# Patient Record
Sex: Female | Born: 1975 | Race: Black or African American | Hispanic: No | Smoking: Never smoker
Health system: Southern US, Community
[De-identification: ages and names within clinical notes are randomized; demographics above are authoritative.]

## PROBLEM LIST (undated history)

## (undated) DIAGNOSIS — E119 Type 2 diabetes mellitus without complications: Secondary | ICD-10-CM

## (undated) DIAGNOSIS — I10 Essential (primary) hypertension: Secondary | ICD-10-CM

## (undated) DIAGNOSIS — F419 Anxiety disorder, unspecified: Secondary | ICD-10-CM

## (undated) DIAGNOSIS — K8020 Calculus of gallbladder without cholecystitis without obstruction: Secondary | ICD-10-CM

## (undated) DIAGNOSIS — Z87442 Personal history of urinary calculi: Secondary | ICD-10-CM

## (undated) DIAGNOSIS — R51 Headache: Secondary | ICD-10-CM

## (undated) DIAGNOSIS — R519 Headache, unspecified: Secondary | ICD-10-CM

## (undated) DIAGNOSIS — M1990 Unspecified osteoarthritis, unspecified site: Secondary | ICD-10-CM

## (undated) DIAGNOSIS — R0600 Dyspnea, unspecified: Secondary | ICD-10-CM

## (undated) HISTORY — PX: TUBAL LIGATION: SHX77

## (undated) HISTORY — PX: CHOLECYSTECTOMY: SHX55

## (undated) HISTORY — PX: OTHER SURGICAL HISTORY: SHX169

---

## 1998-07-26 ENCOUNTER — Other Ambulatory Visit: Admission: RE | Admit: 1998-07-26 | Discharge: 1998-07-26 | Payer: Self-pay | Admitting: Obstetrics and Gynecology

## 1999-06-17 ENCOUNTER — Emergency Department (HOSPITAL_COMMUNITY): Admission: EM | Admit: 1999-06-17 | Discharge: 1999-06-17 | Payer: Self-pay | Admitting: Emergency Medicine

## 1999-06-17 ENCOUNTER — Encounter: Payer: Self-pay | Admitting: Emergency Medicine

## 1999-07-19 ENCOUNTER — Other Ambulatory Visit: Admission: RE | Admit: 1999-07-19 | Discharge: 1999-07-19 | Payer: Self-pay | Admitting: Obstetrics and Gynecology

## 1999-09-06 ENCOUNTER — Emergency Department (HOSPITAL_COMMUNITY): Admission: EM | Admit: 1999-09-06 | Discharge: 1999-09-06 | Payer: Self-pay | Admitting: Emergency Medicine

## 1999-09-07 ENCOUNTER — Emergency Department (HOSPITAL_COMMUNITY): Admission: EM | Admit: 1999-09-07 | Discharge: 1999-09-07 | Payer: Self-pay | Admitting: Emergency Medicine

## 1999-09-09 ENCOUNTER — Emergency Department (HOSPITAL_COMMUNITY): Admission: EM | Admit: 1999-09-09 | Discharge: 1999-09-09 | Payer: Self-pay | Admitting: Emergency Medicine

## 1999-09-13 ENCOUNTER — Emergency Department (HOSPITAL_COMMUNITY): Admission: EM | Admit: 1999-09-13 | Discharge: 1999-09-13 | Payer: Self-pay | Admitting: Emergency Medicine

## 1999-09-17 ENCOUNTER — Emergency Department (HOSPITAL_COMMUNITY): Admission: EM | Admit: 1999-09-17 | Discharge: 1999-09-17 | Payer: Self-pay | Admitting: *Deleted

## 2003-04-10 ENCOUNTER — Other Ambulatory Visit: Admission: RE | Admit: 2003-04-10 | Discharge: 2003-04-10 | Payer: Self-pay | Admitting: Obstetrics and Gynecology

## 2004-05-23 ENCOUNTER — Other Ambulatory Visit: Admission: RE | Admit: 2004-05-23 | Discharge: 2004-05-23 | Payer: Self-pay | Admitting: Obstetrics and Gynecology

## 2005-02-17 ENCOUNTER — Emergency Department (HOSPITAL_COMMUNITY): Admission: EM | Admit: 2005-02-17 | Discharge: 2005-02-17 | Payer: Self-pay | Admitting: Emergency Medicine

## 2005-03-07 ENCOUNTER — Emergency Department (HOSPITAL_COMMUNITY): Admission: EM | Admit: 2005-03-07 | Discharge: 2005-03-07 | Payer: Self-pay | Admitting: Emergency Medicine

## 2009-10-14 ENCOUNTER — Emergency Department (HOSPITAL_COMMUNITY): Admission: EM | Admit: 2009-10-14 | Discharge: 2009-10-15 | Payer: Self-pay | Admitting: Emergency Medicine

## 2010-05-16 LAB — GLUCOSE, CAPILLARY: Glucose-Capillary: 109 mg/dL — ABNORMAL HIGH (ref 70–99)
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
## 2010-12-17 ENCOUNTER — Emergency Department (HOSPITAL_COMMUNITY)
Admission: EM | Admit: 2010-12-17 | Discharge: 2010-12-17 | Disposition: A | Discharge status: Home / Self Care | Payer: Self-pay | Attending: Emergency Medicine | Admitting: Emergency Medicine

## 2010-12-17 DIAGNOSIS — B9689 Other specified bacterial agents as the cause of diseases classified elsewhere: Secondary | ICD-10-CM | POA: Insufficient documentation

## 2010-12-17 DIAGNOSIS — K6289 Other specified diseases of anus and rectum: Secondary | ICD-10-CM | POA: Insufficient documentation

## 2010-12-17 DIAGNOSIS — A499 Bacterial infection, unspecified: Secondary | ICD-10-CM | POA: Insufficient documentation

## 2010-12-17 DIAGNOSIS — N760 Acute vaginitis: Secondary | ICD-10-CM | POA: Insufficient documentation

## 2010-12-17 DIAGNOSIS — R109 Unspecified abdominal pain: Secondary | ICD-10-CM | POA: Insufficient documentation

## 2010-12-17 LAB — URINALYSIS, ROUTINE W REFLEX MICROSCOPIC
Ketones, ur: NEGATIVE mg/dL
Leukocytes, UA: NEGATIVE
Nitrite: NEGATIVE
Protein, ur: NEGATIVE mg/dL
Urobilinogen, UA: 0.2 mg/dL (ref 0.0–1.0)

## 2010-12-17 LAB — POCT PREGNANCY, URINE: Preg Test, Ur: NEGATIVE

## 2011-04-08 ENCOUNTER — Encounter (HOSPITAL_COMMUNITY): Payer: Self-pay | Admitting: Emergency Medicine

## 2011-04-08 ENCOUNTER — Emergency Department (HOSPITAL_COMMUNITY)
Admission: EM | Admit: 2011-04-08 | Discharge: 2011-04-08 | Disposition: A | Discharge status: Home / Self Care | Payer: Self-pay | Attending: Emergency Medicine | Admitting: Emergency Medicine

## 2011-04-08 DIAGNOSIS — R109 Unspecified abdominal pain: Secondary | ICD-10-CM | POA: Insufficient documentation

## 2011-04-08 HISTORY — DX: Essential (primary) hypertension: I10

## 2011-04-08 LAB — DIFFERENTIAL
Basophils Absolute: 0 10*3/uL (ref 0.0–0.1)
Basophils Relative: 1 % (ref 0–1)
Eosinophils Absolute: 0.1 10*3/uL (ref 0.0–0.7)
Eosinophils Relative: 1 % (ref 0–5)
Lymphocytes Relative: 38 % (ref 12–46)
Lymphs Abs: 2.2 10*3/uL (ref 0.7–4.0)
Monocytes Absolute: 0.5 10*3/uL (ref 0.1–1.0)
Monocytes Relative: 8 % (ref 3–12)
Neutro Abs: 3.1 10*3/uL (ref 1.7–7.7)
Neutrophils Relative %: 53 % (ref 43–77)

## 2011-04-08 LAB — POCT PREGNANCY, URINE: Preg Test, Ur: NEGATIVE

## 2011-04-08 LAB — COMPREHENSIVE METABOLIC PANEL
ALT: 13 U/L (ref 0–35)
AST: 13 U/L (ref 0–37)
Albumin: 3.6 g/dL (ref 3.5–5.2)
Alkaline Phosphatase: 68 U/L (ref 39–117)
BUN: 14 mg/dL (ref 6–23)
CO2: 26 mEq/L (ref 19–32)
Calcium: 9.4 mg/dL (ref 8.4–10.5)
Chloride: 103 mEq/L (ref 96–112)
Creatinine, Ser: 0.92 mg/dL (ref 0.50–1.10)
GFR calc Af Amer: >90 mL/min (ref >90)
GFR calc non Af Amer: 78 mL/min — ABNORMAL LOW (ref >90)
Glucose, Bld: 83 mg/dL (ref 70–99)
Potassium: 3.6 mEq/L (ref 3.5–5.1)
Sodium: 138 mEq/L (ref 135–145)
Total Bilirubin: 0.1 mg/dL — ABNORMAL LOW (ref 0.3–1.2)
Total Protein: 6.8 g/dL (ref 6.0–8.3)

## 2011-04-08 LAB — CBC
HCT: 35 % — ABNORMAL LOW (ref 36.0–46.0)
Hemoglobin: 11.2 g/dL — ABNORMAL LOW (ref 12.0–15.0)
MCH: 23.5 pg — ABNORMAL LOW (ref 26.0–34.0)
MCHC: 32 g/dL (ref 30.0–36.0)
MCV: 73.4 fL — ABNORMAL LOW (ref 78.0–100.0)
Platelets: 297 10*3/uL (ref 150–400)
RBC: 4.77 MIL/uL (ref 3.87–5.11)
RDW: 15.5 % (ref 11.5–15.5)
WBC: 5.9 10*3/uL (ref 4.0–10.5)

## 2011-04-08 LAB — URINALYSIS, ROUTINE W REFLEX MICROSCOPIC
Glucose, UA: NEGATIVE mg/dL
Leukocytes, UA: NEGATIVE
Nitrite: NEGATIVE
Protein, ur: NEGATIVE mg/dL
pH: 7.5 (ref 5.0–8.0)

## 2011-04-08 LAB — LIPASE, BLOOD: Lipase: 35 U/L (ref 11–59)

## 2011-04-08 NOTE — ED Notes (Signed)
Pt st's her abdominal pain started 2 days ago, pt took her Mother's gabapentin but that didn't help.  No n/v/d.  St's the last time she had this pain she was dx with bacterial vaginosis, was given antibiotics and that stopped the pain.  Last BM was yesterday.  St's "the pain feels like someone is pulling at my belly button from inside, it feels like pressure."

## 2011-04-08 NOTE — ED Notes (Signed)
Patient is resting comfortably. 

## 2011-04-08 NOTE — ED Notes (Signed)
Patient complains of abdominal pain beginning yesterday, got worse today. Pain is described as pulling pressure around umbilicus, also complains of bloating. No nausea or vomiting associated with pain. Last bowel movement yesterday, patient states it was normal.

## 2011-04-08 NOTE — ED Notes (Signed)
MD at bedside. 

## 2011-04-08 NOTE — ED Provider Notes (Signed)
History     CSN: [id number]  Arrival date & time 04/08/11  1549   First MD Initiated Contact with Patient 04/08/11 2013      Chief Complaint  Patient presents with  . Abdominal Pain    HPI: Patient is a 36 y.o. female presenting with abdominal pain. The history is provided by the patient.  Abdominal Pain The primary symptoms of the illness include abdominal pain. The primary symptoms of the illness do not include fever, shortness of breath, nausea, vomiting, diarrhea, dysuria or vaginal discharge. The current episode started 13 to 24 hours ago. The onset of the illness was gradual. The problem has been gradually worsening.  The patient states that she believes she is currently not pregnant. The patient has not had a change in bowel habit. Symptoms associated with the illness do not include chills, anorexia, constipation, urgency, frequency or back pain.  Pt noted to be sleeping upon my arrival to room.  Reports onset of periumbilical abd pain yesterday that has been persistent. States she has also had increased bloating but states this is not new for her.  Pt denies N/V/D, fever, anorexia, CP, SOB, back pain, UTI sx's or constipation. States had normal BM yesterday. Denies unusual vag d/c, LMP 03/29/2011 was normal. States had to leave work today due to pain which is currently 5/10.   Past Medical History  Diagnosis Date  . Hypertension     Past Surgical History  Procedure Date  . Tubal ligation     Family History  Problem Relation Age of Onset  . Hypertension Mother   . Diabetes Mother   . Heart failure Mother   . Hypertension Father     History  Substance Use Topics  . Smoking status: Never Smoker   . Smokeless tobacco: Never Used  . Alcohol Use: Yes     occausional    OB History    Grav Para Term Preterm Abortions TAB SAB Ect Mult Living                  Review of Systems  Constitutional: Negative.  Negative for fever and chills.  HENT: Negative.   Eyes:  Negative.   Respiratory: Negative.  Negative for shortness of breath.   Cardiovascular: Negative.   Gastrointestinal: Positive for abdominal pain. Negative for nausea, vomiting, diarrhea, constipation and anorexia.  Genitourinary: Negative.  Negative for dysuria, urgency, frequency and vaginal discharge.  Musculoskeletal: Negative.  Negative for back pain.  Skin: Negative.   Neurological: Negative.   Hematological: Negative.   Psychiatric/Behavioral: Negative.     Allergies  Review of patient's allergies indicates no known allergies.  Home Medications   Current Outpatient Rx  Name Route Sig Dispense Refill  . GABAPENTIN PO Oral Take 1 capsule by mouth daily as needed. For pain.    . ADULT MULTIVITAMIN W/MINERALS CH Oral Take 1 tablet by mouth daily.    Marland Kitchen NAPROXEN SODIUM 220 MG PO TABS Oral Take 440 mg by mouth 2 (two) times daily as needed. For pain.      BP 158/96  Pulse 78  Temp(Src) 98.6 F (37 C) (Oral)  Resp 16  Ht 5\' 2"  (1.575 m)  Wt 237 lb (107.502 kg)  BMI 43.35 kg/m2  SpO2 100%  LMP 03/29/2011  Physical Exam  Constitutional: She is oriented to person, place, and time. She appears well-developed and well-nourished.  HENT:  Head: Normocephalic and atraumatic.  Eyes: Conjunctivae are normal.  Neck: Neck supple.  Cardiovascular: Normal  rate and regular rhythm.   Pulmonary/Chest: Effort normal and breath sounds normal.  Abdominal: Soft. Bowel sounds are normal.    Musculoskeletal: Normal range of motion.  Neurological: She is alert and oriented to person, place, and time.  Skin: Skin is warm and dry. No erythema.  Psychiatric: She has a normal mood and affect.    ED Course  Procedures  Findings and clinical impression discussed w/ pt. Will plan for d/c home w/ referals to assist pt in getting established w/ PCP. Pt agreeable w/ plan. I have discussed py w/ Dr Denton Lank who has also seen pt and is in agreement w/ plan.  Labs Reviewed  CBC - Abnormal; Notable  for the following:    Hemoglobin 11.2 (*)    HCT 35.0 (*)    MCV 73.4 (*)    MCH 23.5 (*)    All other components within normal limits  COMPREHENSIVE METABOLIC PANEL - Abnormal; Notable for the following:    Total Bilirubin 0.1 (*)    GFR calc non Af Amer 78 (*)    All other components within normal limits  URINALYSIS, ROUTINE W REFLEX MICROSCOPIC  POCT PREGNANCY, URINE  DIFFERENTIAL  LIPASE, BLOOD   No results found.   No diagnosis found.    MDM  Pt w/ periumbilical pain x 1 day. No associated sx's. Labs and u/a w/o acute findings. PE unremarkable. VS normal. Pt noted sleeping upon most re-assessments Acute abd process considered but unlikely given above findings.        Leanne Chang, NP 04/10/11 2032

## 2011-04-10 NOTE — ED Provider Notes (Signed)
History     CSN: [id number]  Arrival date & time 04/08/11  1549   First MD Initiated Contact with Patient 04/08/11 2013      Chief Complaint  Patient presents with  . Abdominal Pain    (Consider location/radiation/quality/duration/timing/severity/associated sxs/prior treatment) HPI  Past Medical History  Diagnosis Date  . Hypertension     Past Surgical History  Procedure Date  . Tubal ligation     Family History  Problem Relation Age of Onset  . Hypertension Mother   . Diabetes Mother   . Heart failure Mother   . Hypertension Father     History  Substance Use Topics  . Smoking status: Never Smoker   . Smokeless tobacco: Never Used  . Alcohol Use: Yes     occausional    OB History    Grav Para Term Preterm Abortions TAB SAB Ect Mult Living                  Review of Systems  Allergies  Review of patient's allergies indicates no known allergies.  Home Medications   Current Outpatient Rx  Name Route Sig Dispense Refill  . GABAPENTIN PO Oral Take 1 capsule by mouth daily as needed. For pain.    . ADULT MULTIVITAMIN W/MINERALS CH Oral Take 1 tablet by mouth daily.    Marland Kitchen NAPROXEN SODIUM 220 MG PO TABS Oral Take 440 mg by mouth 2 (two) times daily as needed. For pain.      BP 141/85  Pulse 94  Temp(Src) 98.6 F (37 C) (Oral)  Resp 16  Ht 5\' 2"  (1.575 m)  Wt 237 lb (107.502 kg)  BMI 43.35 kg/m2  SpO2 100%  LMP 03/29/2011  Physical Exam  ED Course  Procedures (including critical care time)  Labs Reviewed  CBC - Abnormal; Notable for the following:    Hemoglobin 11.2 (*)    HCT 35.0 (*)    MCV 73.4 (*)    MCH 23.5 (*)    All other components within normal limits  COMPREHENSIVE METABOLIC PANEL - Abnormal; Notable for the following:    Total Bilirubin 0.1 (*)    GFR calc non Af Amer 78 (*)    All other components within normal limits  URINALYSIS, ROUTINE W REFLEX MICROSCOPIC  POCT PREGNANCY, URINE  DIFFERENTIAL  LIPASE, BLOOD  LAB REPORT  - SCANNED    1. Abdominal pain       MDM        Medical screening examination/treatment/procedure(s) were conducted as a shared visit with non-physician practitioner(s) and myself.  I personally evaluated the patient during the encounter abd soft nt. Pain resolved.   Suzi Roots, MD 04/10/11 [phone]

## 2011-04-11 NOTE — ED Provider Notes (Signed)
Medical screening examination/treatment/procedure(s) were conducted as a shared visit with non-physician practitioner(s) and myself.  I personally evaluated the patient during the encounter abd soft nt. No nvd.   Suzi Roots, MD 04/11/11 1325

## 2012-03-29 ENCOUNTER — Telehealth: Payer: Self-pay | Admitting: Obstetrics and Gynecology

## 2012-03-29 NOTE — Telephone Encounter (Signed)
Attempted to call pt, unable to leave vm.

## 2012-03-30 ENCOUNTER — Telehealth: Payer: Self-pay

## 2012-03-30 NOTE — Telephone Encounter (Signed)
Tc to pt requesting referral for pcp. Advised pt that she could try to contact the offices of Dr. Dorothyann Peng, Dr. Renford Dills, and Dr. Merri Brunette and gave contact information. Pt agreeable.

## 2015-05-05 ENCOUNTER — Emergency Department (HOSPITAL_COMMUNITY)
Admission: EM | Admit: 2015-05-05 | Discharge: 2015-05-05 | Disposition: A | Discharge status: Home / Self Care | Payer: Self-pay | Attending: Emergency Medicine | Admitting: Emergency Medicine

## 2015-05-05 ENCOUNTER — Emergency Department (HOSPITAL_COMMUNITY): Payer: Self-pay

## 2015-05-05 ENCOUNTER — Encounter (HOSPITAL_COMMUNITY): Payer: Self-pay | Admitting: Emergency Medicine

## 2015-05-05 DIAGNOSIS — M546 Pain in thoracic spine: Secondary | ICD-10-CM | POA: Insufficient documentation

## 2015-05-05 DIAGNOSIS — K8050 Calculus of bile duct without cholangitis or cholecystitis without obstruction: Secondary | ICD-10-CM

## 2015-05-05 DIAGNOSIS — K8020 Calculus of gallbladder without cholecystitis without obstruction: Secondary | ICD-10-CM | POA: Insufficient documentation

## 2015-05-05 DIAGNOSIS — I10 Essential (primary) hypertension: Secondary | ICD-10-CM | POA: Insufficient documentation

## 2015-05-05 DIAGNOSIS — Z79899 Other long term (current) drug therapy: Secondary | ICD-10-CM | POA: Insufficient documentation

## 2015-05-05 DIAGNOSIS — R079 Chest pain, unspecified: Secondary | ICD-10-CM | POA: Insufficient documentation

## 2015-05-05 LAB — CBC
HCT: 34.5 % — ABNORMAL LOW (ref 36.0–46.0)
HEMOGLOBIN: 11.2 g/dL — AB (ref 12.0–15.0)
MCH: 24.6 pg — AB (ref 26.0–34.0)
MCHC: 32.5 g/dL (ref 30.0–36.0)
MCV: 75.7 fL — ABNORMAL LOW (ref 78.0–100.0)
Platelets: 277 10*3/uL (ref 150–400)
RBC: 4.56 MIL/uL (ref 3.87–5.11)
RDW: 15.1 % (ref 11.5–15.5)
WBC: 6.3 10*3/uL (ref 4.0–10.5)

## 2015-05-05 LAB — COMPREHENSIVE METABOLIC PANEL
ALBUMIN: 3.6 g/dL (ref 3.5–5.0)
ALK PHOS: 64 U/L (ref 38–126)
ALT: 12 U/L — AB (ref 14–54)
AST: 18 U/L (ref 15–41)
Anion gap: 12 (ref 5–15)
BUN: 13 mg/dL (ref 6–20)
CALCIUM: 9.5 mg/dL (ref 8.9–10.3)
CHLORIDE: 104 mmol/L (ref 101–111)
CO2: 25 mmol/L (ref 22–32)
CREATININE: 1 mg/dL (ref 0.44–1.00)
GFR calc Af Amer: >60 mL/min (ref >60)
GFR calc non Af Amer: >60 mL/min (ref >60)
GLUCOSE: 167 mg/dL — AB (ref 65–99)
Potassium: 3.8 mmol/L (ref 3.5–5.1)
SODIUM: 141 mmol/L (ref 135–145)
Total Bilirubin: 0.4 mg/dL (ref 0.3–1.2)
Total Protein: 6.3 g/dL — ABNORMAL LOW (ref 6.5–8.1)

## 2015-05-05 LAB — TROPONIN I: Troponin I: <0.03 ng/mL (ref <0.031)

## 2015-05-05 LAB — LIPASE, BLOOD: Lipase: 38 U/L (ref 11–51)

## 2015-05-05 MED ORDER — HYDROCODONE-ACETAMINOPHEN 5-325 MG PO TABS: 1.0000 | ORAL_TABLET | Freq: Four times a day (QID) | ORAL | Status: DC | PRN

## 2015-05-05 NOTE — ED Notes (Signed)
Patient transported to Ultrasound 

## 2015-05-05 NOTE — ED Provider Notes (Addendum)
`CSN: [id number]     Arrival date & time 05/05/15  0006 History  By signing my name below, I, Melinda Ingram, attest that this documentation has been prepared under the direction and in the presence of [name], MD. Electronically Signed: [name], ED Scribe. 05/05/2015. 2:40 AM.    Chief Complaint  Patient presents with  . Chest Pain  . Abdominal Pain  . Back Pain   The history is provided by the patient. No language interpreter was used.    HPI Comments: Melinda Ingram is a 40 y.o. female with h/o HTN, GERD who presents to the Emergency Department complaining of moderate, improving, burning epigastric abdominal pain and substernal CP onset 6 hours ago while at rest. No associated w exertion or activity level. Symptoms not pleuritic. Pt states associated mid to upper back pain. She notes that her pain has been greatly improving since onset. Pt states she did not eat before onset of pain. She states she took acid reflux medication and drank water with baking soda with mild to moderate of symptoms. She reports that she has had 2 other prior episodes of similar pain and has been given a possible dx of GERD. No h/o cholelithiasis, cholecystectomy. No fam hx premature cad, although states mother and gm with unspecified heart problems.   She denies emesis, diaphoresis, SOB, cough, congestion, rhinorrhea, sore throat, HA, visual disturbance.    Past Medical History  Diagnosis Date  . Hypertension   . GERD (gastroesophageal reflux disease)    Past Surgical History  Procedure Laterality Date  . Tubal ligation     Family History  Problem Relation Age of Onset  . Hypertension Mother   . Diabetes Mother   . Heart failure Mother   . Hypertension Father    Social History  Substance Use Topics  . Smoking status: Never Smoker   . Smokeless tobacco: Never Used  . Alcohol Use: Yes     Comment: occausional   OB History    No data available     Review of Systems  Constitutional: Negative for  fever, chills and diaphoresis.  HENT: Negative for congestion, rhinorrhea and sore throat.   Eyes: Negative for redness and visual disturbance.  Respiratory: Negative for cough and shortness of breath.   Cardiovascular: Positive for chest pain. Negative for leg swelling.  Gastrointestinal: Positive for nausea and abdominal pain. Negative for vomiting and diarrhea.  Endocrine: Negative for polyuria.  Genitourinary: Negative for dysuria, vaginal bleeding and vaginal discharge.  Musculoskeletal: Positive for back pain. Negative for neck pain.  Skin: Negative for rash.  Neurological: Negative for headaches.  Hematological: Does not bruise/bleed easily.  Psychiatric/Behavioral: Negative for confusion.   Allergies  Review of patient's allergies indicates no known allergies.  Home Medications   Prior to Admission medications   Medication Sig Start Date End Date Taking? Authorizing Provider  famotidine (PEPCID) 20 MG tablet Take 20 mg by mouth 2 (two) times daily.   Yes Historical Provider, MD  GABAPENTIN PO Take 1 capsule by mouth daily as needed. For pain.    Historical Provider, MD  Multiple Vitamin (MULITIVITAMIN WITH MINERALS) TABS Take 1 tablet by mouth daily.    Historical Provider, MD  naproxen sodium (ANAPROX) 220 MG tablet Take 440 mg by mouth 2 (two) times daily as needed. For pain.    Historical Provider, MD   BP 159/90 mmHg  Pulse 64  Temp(Src) 97.4 F (36.3 C) (Oral)  Resp 18  SpO2 98%  LMP 04/28/2015  Physical Exam  Constitutional: She is oriented to person, place, and time. She appears well-developed and well-nourished.  HENT:  Head: Normocephalic and atraumatic.  Eyes: Conjunctivae and EOM are normal. No scleral icterus.  Neck: Normal range of motion. Neck supple.  Cardiovascular: Normal rate, regular rhythm, normal heart sounds and intact distal pulses.  Exam reveals no gallop and no friction rub.   No murmur heard. Pulmonary/Chest: Effort normal and breath sounds  normal. No respiratory distress. She exhibits no tenderness.  Abdominal: Soft. Bowel sounds are normal. She exhibits no distension and no mass. There is no tenderness. There is no rebound and no guarding.  Genitourinary:  No cva tenderness  Musculoskeletal: Normal range of motion. She exhibits no edema or tenderness.  CTLS spine, non tender, aligned, no step off.   Neurological: She is alert and oriented to person, place, and time.  Skin: Skin is warm and dry. No rash noted. She is not diaphoretic.  Psychiatric: She has a normal mood and affect. Her behavior is normal.  Nursing note and vitals reviewed.   ED Course  Procedures (including critical care time) DIAGNOSTIC STUDIES: Oxygen Saturation is 98% on RA, normal by my interpretation.    COORDINATION OF CARE: 2:35 AM Discussed treatment plan with pt at bedside which includes EKG, CT abd/pelv, lab work and pt agreed to plan.   Labs Review   Results for orders placed or performed during the hospital encounter of 05/05/15  CBC  Result Value Ref Range   WBC 6.3 4.0 - 10.5 K/uL   RBC 4.56 3.87 - 5.11 MIL/uL   Hemoglobin 11.2 (L) 12.0 - 15.0 g/dL   HCT 16.1 (L) 09.6 - 04.5 %   MCV 75.7 (L) 78.0 - 100.0 fL   MCH 24.6 (L) 26.0 - 34.0 pg   MCHC 32.5 30.0 - 36.0 g/dL   RDW 40.9 81.1 - 91.4 %   Platelets 277 150 - 400 K/uL  Comprehensive metabolic panel  Result Value Ref Range   Sodium 141 135 - 145 mmol/L   Potassium 3.8 3.5 - 5.1 mmol/L   Chloride 104 101 - 111 mmol/L   CO2 25 22 - 32 mmol/L   Glucose, Bld 167 (H) 65 - 99 mg/dL   BUN 13 6 - 20 mg/dL   Creatinine, Ser 7.82 0.44 - 1.00 mg/dL   Calcium 9.5 8.9 - 95.6 mg/dL   Total Protein 6.3 (L) 6.5 - 8.1 g/dL   Albumin 3.6 3.5 - 5.0 g/dL   AST 18 15 - 41 U/L   ALT 12 (L) 14 - 54 U/L   Alkaline Phosphatase 64 38 - 126 U/L   Total Bilirubin 0.4 0.3 - 1.2 mg/dL   GFR calc non Af Amer >60 >60 mL/min   GFR calc Af Amer >60 >60 mL/min   Anion gap 12 5 - 15  Lipase, blood   Result Value Ref Range   Lipase 38 11 - 51 U/L  Troponin I  Result Value Ref Range   Troponin I <0.03 <0.031 ng/mL   US Abdomen Complete  05/05/2015  CLINICAL DATA:  Chronic generalized abdominal, chest and back pain. Initial encounter. EXAM: ABDOMEN ULTRASOUND COMPLETE COMPARISON:  None. FINDINGS: Gallbladder: Scattered stones are seen dependently within the gallbladder, measuring up to 7 mm in size. These are mobile in nature. No gallbladder wall thickening or pericholecystic fluid is seen. No ultrasonographic Murphy's sign is elicited. Common bile duct: Diameter: 0.4 cm, within normal limits in caliber. Liver: No focal lesion identified.  Within normal limits in parenchymal echogenicity. IVC: No abnormality visualized. Pancreas: Visualized portion unremarkable. Spleen: Size and appearance within normal limits. Right Kidney: Length: 9.3 cm. Echogenicity within normal limits. No mass or hydronephrosis visualized. Left Kidney: Length: 9.8 cm. Echogenicity within normal limits. No mass or hydronephrosis visualized. Abdominal aorta: No aneurysm visualized. Not characterized distally due to overlying bowel gas. Other findings: None. IMPRESSION: 1. No acute abnormality seen within the abdomen. 2. Cholelithiasis.  Gallbladder otherwise unremarkable. Electronically Signed   By: Roanna RaiderJeffery  Chang M.D.   On: 05/05/2015 03:54       I have personally reviewed and evaluated these images and lab results as part of my medical decision-making.   EKG Interpretation   Date/Time:  Saturday May 05 2015 00:16:12 EST Ventricular Rate:  61 PR Interval:  132 QRS Duration: 82 QT Interval:  428 QTC Calculation: 430 R Axis:   49 Text Interpretation:  Normal sinus rhythm T wave abnormality, consider  inferior ischemia Abnormal ECG Confirmed by Bebe ShaggyWICKLINE  MD, Dorinda HillNALD ([id number])  on 05/05/2015 12:22:41 AM      MDM   I personally performed the services described in this documentation, which was scribed in my presence.  The recorded information has been reviewed and considered. Cathren LaineKevin Shanetra Blumenstock, MD  Reviewed nursing notes and prior charts for additional history.   Labs.  With recurrent upper abd pain, radiating to back, will get u/s for gallstones.  Recheck pt, pain has resolved. No nv.   Hx and ED workup c/w gallstones/biliary colic.  Will refer to gen surgery follow up.  Discussed results w pt.   Pt currently appears stable for d/c.      Cathren LaineKevin Nattie Lazenby, MD 05/05/15 [phone]

## 2015-05-05 NOTE — ED Notes (Signed)
Pt departed in NAD.  

## 2015-05-05 NOTE — ED Notes (Addendum)
C/o constant burning pain to center of chest and abd that radiates to back since 8pm.  Denies sob, nausea, and vomiting.  States it feels like her usual acid reflux.  Took Pepcid 1 hour ago without relief.

## 2015-05-05 NOTE — Discharge Instructions (Signed)
It was our pleasure to provide your ER care today - we hope that you feel better.  Your ultrasound shows gallstones.    Follow up with general surgeon in the next 1-2 weeks - call office Monday to arrange appointment.  Take motrin or aleve as need for pain. You may also take hydrocodone as need for pain. No driving when taking hydrocodone. Also, do not take tylenol or acetaminophen containing medication when taking hydrocodone.  Return to ER if worse, new symptoms, fevers, persistent vomiting, severe pain, other concern.    Cholelithiasis Cholelithiasis (also called gallstones) is a form of gallbladder disease in which gallstones form in your gallbladder. The gallbladder is an organ that stores bile made in the liver, which helps digest fats. Gallstones begin as small crystals and slowly grow into stones. Gallstone pain occurs when the gallbladder spasms and a gallstone is blocking the duct. Pain can also occur when a stone passes out of the duct.  RISK FACTORS  Being female.   Having multiple pregnancies. Health care providers sometimes advise removing diseased gallbladders before future pregnancies.   Being obese.  Eating a diet heavy in fried foods and fat.   Being older than 60 years and increasing age.   Prolonged use of medicines containing female hormones.   Having diabetes mellitus.   Rapidly losing weight.   Having a family history of gallstones (heredity).  SYMPTOMS  Nausea.   Vomiting.  Abdominal pain.   Yellowing of the skin (jaundice).   Sudden pain. It may persist from several minutes to several hours.  Fever.   Tenderness to the touch. In some cases, when gallstones do not move into the bile duct, people have no pain or symptoms. These are called "silent" gallstones.  TREATMENT Silent gallstones do not need treatment. In severe cases, emergency surgery may be required. Options for treatment include:  Surgery to remove the gallbladder.  This is the most common treatment.  Medicines. These do not always work and may take 6-12 months or more to work.  Shock wave treatment (extracorporeal biliary lithotripsy). In this treatment an ultrasound machine sends shock waves to the gallbladder to break gallstones into smaller pieces that can pass into the intestines or be dissolved by medicine. HOME CARE INSTRUCTIONS   Only take over-the-counter or prescription medicines for pain, discomfort, or fever as directed by your health care provider.   Follow a low-fat diet until seen again by your health care provider. Fat causes the gallbladder to contract, which can result in pain.   Follow up with your health care provider as directed. Attacks are almost always recurrent and surgery is usually required for permanent treatment.  SEEK IMMEDIATE MEDICAL CARE IF:   Your pain increases and is not controlled by medicines.   You have a fever or persistent symptoms for more than 2-3 days.   You have a fever and your symptoms suddenly get worse.   You have persistent nausea and vomiting.  MAKE SURE YOU:   Understand these instructions.  Will watch your condition.  Will get help right away if you are not doing well or get worse.   This information is not intended to replace advice given to you by your health care provider. Make sure you discuss any questions you have with your health care provider.   Document Released: 02/13/2005 Document Revised: 10/20/2012 Document Reviewed: 08/11/2012 Elsevier Interactive Patient Education [employer]2016 [employer]

## 2016-05-23 ENCOUNTER — Ambulatory Visit: Payer: Self-pay | Admitting: Family Medicine

## 2016-05-28 ENCOUNTER — Ambulatory Visit (INDEPENDENT_AMBULATORY_CARE_PROVIDER_SITE_OTHER): Payer: 59 | Admitting: Emergency Medicine

## 2016-05-28 ENCOUNTER — Ambulatory Visit (INDEPENDENT_AMBULATORY_CARE_PROVIDER_SITE_OTHER): Payer: 59

## 2016-05-28 VITALS — BP 149/90 | HR 95 | Temp 98.5°F | Resp 16 | Ht 62.0 in | Wt 255.6 lb

## 2016-05-28 DIAGNOSIS — R030 Elevated blood-pressure reading, without diagnosis of hypertension: Secondary | ICD-10-CM | POA: Insufficient documentation

## 2016-05-28 DIAGNOSIS — M25572 Pain in left ankle and joints of left foot: Secondary | ICD-10-CM

## 2016-05-28 DIAGNOSIS — L539 Erythematous condition, unspecified: Secondary | ICD-10-CM | POA: Diagnosis not present

## 2016-05-28 DIAGNOSIS — I10 Essential (primary) hypertension: Secondary | ICD-10-CM | POA: Diagnosis not present

## 2016-05-28 MED ORDER — DICLOFENAC SODIUM 75 MG PO TBEC: 75.0000 mg | DELAYED_RELEASE_TABLET | Freq: Two times a day (BID) | ORAL | 0 refills | Status: AC

## 2016-05-28 MED ORDER — CEFADROXIL 500 MG PO CAPS: 500.0000 mg | ORAL_CAPSULE | Freq: Two times a day (BID) | ORAL | 0 refills | Status: AC

## 2016-05-28 MED ORDER — AMLODIPINE BESYLATE 5 MG PO TABS: 5.0000 mg | ORAL_TABLET | Freq: Every day | ORAL | 3 refills | Status: DC

## 2016-05-28 NOTE — Progress Notes (Signed)
Melinda Ingram 41 y.o.   Chief Complaint  Patient presents with  . Foot Swelling    left    HISTORY OF PRESENT ILLNESS: This is a 41 y.o. female complaining of left foot pain x several days; no injury. Foot Injury   The incident occurred 3 to 5 days ago. There was no injury mechanism. The pain is present in the left foot. The quality of the pain is described as aching. The pain is at a severity of 5/10. The pain is moderate. The pain has been fluctuating since onset. Pertinent negatives include no loss of motion, loss of sensation, numbness or tingling. Associated symptoms comments: Painful to ambulate.. The symptoms are aggravated by weight bearing.     Prior to Admission medications   Medication Sig Start Date End Date Taking? Authorizing Provider  famotidine (PEPCID) 20 MG tablet Take 20 mg by mouth 2 (two) times daily.    Historical Provider, MD  HYDROcodone-acetaminophen (NORCO/VICODIN) 5-325 MG tablet Take 1-2 tablets by mouth every 6 (six) hours as needed for moderate pain. Patient not taking: Reported on 05/28/2016 05/05/15   [name], MD  lisinopril-hydrochlorothiazide (PRINZIDE,ZESTORETIC) 10-12.5 MG tablet Take 1 tablet by mouth daily.    Historical Provider, MD  naproxen sodium (ANAPROX) 220 MG tablet Take 440 mg by mouth 2 (two) times daily as needed (pain).     Historical Provider, MD    No Known Allergies  There are no active problems to display for this patient.   Past Medical History:  Diagnosis Date  . GERD (gastroesophageal reflux disease)   . Hypertension     Past Surgical History:  Procedure Laterality Date  . TUBAL LIGATION      Social History   Social History  . Marital status: Divorced    Spouse name: N/A  . Number of children: N/A  . Years of education: N/A   Occupational History  . Not on file.   Social History Main Topics  . Smoking status: Never Smoker  . Smokeless tobacco: Never Used  . Alcohol use Yes     Comment: occausional  .  Drug use: No  . Sexual activity: Not Currently    Birth control/ protection: None   Other Topics Concern  . Not on file   Social History Narrative  . No narrative on file    Family History  Problem Relation Age of Onset  . Hypertension Mother   . Diabetes Mother   . Heart failure Mother   . Hypertension Father      Review of Systems  Constitutional: Negative for chills and fever.  HENT: Negative.   Eyes: Negative.   Respiratory: Negative.  Negative for shortness of breath and wheezing.   Cardiovascular: Negative.  Negative for claudication and leg swelling.  Gastrointestinal: Negative for abdominal pain, diarrhea, nausea and vomiting.  Genitourinary: Negative for dysuria and hematuria.  Musculoskeletal: Negative for joint pain and myalgias.       Left foot pain  Skin: Positive for rash.  Neurological: Negative for dizziness, tingling, sensory change, focal weakness and numbness.  Endo/Heme/Allergies: Negative.   All other systems reviewed and are negative.   Vitals:   05/28/16 1424  BP: (!) 149/90  Pulse: 95  Resp: 16  Temp: 98.5 F (36.9 C)    Physical Exam  Constitutional: She is oriented to person, place, and time. She appears well-developed and well-nourished.  HENT:  Head: Normocephalic and atraumatic.  Nose: Nose normal.  Mouth/Throat: Oropharynx is clear and moist.  Eyes: Conjunctivae and EOM are normal. Pupils are equal, round, and reactive to light.  Neck: Normal range of motion. Neck supple.  Cardiovascular: Normal rate and regular rhythm.   Pulmonary/Chest: Effort normal and breath sounds normal.  Neurological: She is alert and oriented to person, place, and time. She displays normal reflexes. No sensory deficit. She exhibits normal muscle tone.  Skin: Skin is warm and dry. Capillary refill takes less than 2 seconds.  Left foot: +erythema and swelling to MTP area; 1st MTP joint spared; NVI with FROM.  Psychiatric: She has a normal mood and affect.  Her behavior is normal.  Vitals reviewed.  Xray reviewed by me: No bony abnormality.  ASSESSMENT & PLAN: Nallely was seen today for foot swelling.  Diagnoses and all orders for this visit:  Pain of joint of left ankle and foot -     DG Foot Complete Left; Future  Foot erythema Comments: r/o cellulitis  Essential hypertension  Other orders -     diclofenac (VOLTAREN) 75 MG EC tablet; Take 1 tablet (75 mg total) by mouth 2 (two) times daily. -     cefadroxil (DURICEF) 500 MG capsule; Take 1 capsule (500 mg total) by mouth 2 (two) times daily. -     amLODipine (NORVASC) 5 MG tablet; Take 1 tablet (5 mg total) by mouth daily.    Patient Instructions       IF you received an x-ray today, you will receive an invoice from [facility]. Please contact [facility] at [phone] with questions or concerns regarding your invoice.   IF you received labwork today, you will receive an invoice from [facility]. Please contact [facility] at [phone] with questions or concerns regarding your invoice.   Our billing staff will not be able to assist you with questions regarding bills from these companies.  You will be contacted with the lab results as soon as they are available. The fastest way to get your results is to activate your My Chart account. Instructions are located on the last page of this paperwork. If you have not heard from [place] regarding the results in 2 weeks, please contact this office.      Foot Pain Many things can cause foot pain. Some common causes are:  An injury.  A sprain.  Arthritis.  Blisters.  Bunions. Follow these instructions at home: Pay attention to any changes in your symptoms. Take these actions to help with your discomfort:  If directed, put ice on the affected area:  Put ice in a plastic bag.  Place a towel between your skin and the bag.  Leave the ice on for 15-20 minutes, 3?4 times a day for 2 days.  Take  over-the-counter and prescription medicines only as told by your health care provider.  Wear comfortable, supportive shoes that fit you well. Do not wear high heels.  Do not stand or walk for long periods of time.  Do not lift a lot of weight. This can put added pressure on your feet.  Do stretches to relieve foot pain and stiffness as told by your health care provider.  Rub your foot gently.  Keep your feet clean and dry. Contact a health care provider if:  Your pain does not get better after a few days of self-care.  Your pain gets worse.  You cannot stand on your foot. Get help right away if:  Your foot is numb or tingling.  Your foot or toes are swollen.  Your foot or toes  turn white or blue.  You have warmth and redness along your foot. This information is not intended to replace advice given to you by your health care provider. Make sure you discuss any questions you have with your health care provider. Document Released: 03/16/2015 Document Revised: 07/26/2015 Document Reviewed: 03/15/2014 Elsevier Interactive Patient Education  2017 [employer].  How to Take Your Blood Pressure You can take your blood pressure at home with a machine. You may need to check your blood pressure at home:  To check if you have high blood pressure (hypertension).  To check your blood pressure over time.  To make sure your blood pressure medicine is working. Supplies needed: You will need a blood pressure machine, or monitor. You can buy one at a drugstore or online. When choosing one:  Choose one with an arm cuff.  Choose one that wraps around your upper arm. Only one finger should fit between your arm and the cuff.  Do not choose one that measures your blood pressure from your wrist or finger. Your doctor can suggest a monitor. How to prepare Avoid these things for 30 minutes before checking your blood pressure:  Drinking caffeine.  Drinking  alcohol.  Eating.  Smoking.  Exercising. Five minutes before checking your blood pressure:  Pee.  Sit in a dining chair. Avoid sitting in a soft couch or armchair.  Be quiet. Do not talk. How to take your blood pressure Follow the instructions that came with your machine. If you have a digital blood pressure monitor, these may be the instructions: 1. Sit up straight. 2. Place your feet on the floor. Do not cross your ankles or legs. 3. Rest your left arm at the level of your heart. You may rest it on a table, desk, or chair. 4. Pull up your shirt sleeve. 5. Wrap the blood pressure cuff around the upper part of your left arm. The cuff should be 1 inch (2.5 cm) above your elbow. It is best to wrap the cuff around bare skin. 6. Fit the cuff snugly around your arm. You should be able to place only one finger between the cuff and your arm. 7. Put the cord inside the groove of your elbow. 8. Press the power button. 9. Sit quietly while the cuff fills with air and loses air. 10. Write down the numbers on the screen. 11. Wait 2-3 minutes and then repeat steps 1-10. What do the numbers mean? Two numbers make up your blood pressure. The first number is called systolic pressure. The second is called diastolic pressure. An example of a blood pressure reading is "120 over 80" (or 120/80). If you are an adult and do not have a medical condition, use this guide to find out if your blood pressure is normal: Normal   First number: below 120.  Second number: below 80. Elevated   First number: 120-129.  Second number: below 80. Hypertension stage 1   First number: 130-139.  Second number: 80-89. Hypertension stage 2   First number: 140 or above.  Second number: 90 or above. Your blood pressure is above normal even if only the top or bottom number is above normal. Follow these instructions at home:  Check your blood pressure as often as your doctor tells you to.  Take your monitor  to your next doctor's appointment. Your doctor will:  Make sure you are using it correctly.  Make sure it is working right.  Make sure you understand what your blood  pressure numbers should be.  Tell your doctor if your medicines are causing side effects. Contact a doctor if:  Your blood pressure keeps being high. Get help right away if:  Your first blood pressure number is higher than 180.  Your second blood pressure number is higher than 120. This information is not intended to replace advice given to you by your health care provider. Make sure you discuss any questions you have with your health care provider. Document Released: 01/31/2008 Document Revised: 01/16/2016 Document Reviewed: 07/27/2015 Elsevier Interactive Patient Education  2017 Elsevier Inc.  Hypertension Hypertension is another name for high blood pressure. High blood pressure forces your heart to work harder to pump blood. This can cause problems over time. There are two numbers in a blood pressure reading. There is a top number (systolic) over a bottom number (diastolic). It is best to have a blood pressure below 120/80. Healthy choices can help lower your blood pressure. You may need medicine to help lower your blood pressure if:  Your blood pressure cannot be lowered with healthy choices.  Your blood pressure is higher than 130/80. Follow these instructions at home: Eating and drinking   If directed, follow the DASH eating plan. This diet includes:  Filling half of your plate at each meal with fruits and vegetables.  Filling one quarter of your plate at each meal with whole grains. Whole grains include whole wheat pasta, brown rice, and whole grain bread.  Eating or drinking low-fat dairy products, such as skim milk or low-fat yogurt.  Filling one quarter of your plate at each meal with low-fat (lean) proteins. Low-fat proteins include fish, skinless chicken, eggs, beans, and tofu.  Avoiding fatty meat,  cured and processed meat, or chicken with skin.  Avoiding premade or processed food.  Eat less than 1,500 mg of salt (sodium) a day.  Limit alcohol use to no more than 1 drink a day for nonpregnant women and 2 drinks a day for men. One drink equals 12 oz of beer, 5 oz of wine, or 1 oz of hard liquor. Lifestyle   Work with your doctor to stay at a healthy weight or to lose weight. Ask your doctor what the best weight is for you.  Get at least 30 minutes of exercise that causes your heart to beat faster (aerobic exercise) most days of the week. This may include walking, swimming, or biking.  Get at least 30 minutes of exercise that strengthens your muscles (resistance exercise) at least 3 days a week. This may include lifting weights or pilates.  Do not use any products that contain nicotine or tobacco. This includes cigarettes and e-cigarettes. If you need help quitting, ask your doctor.  Check your blood pressure at home as told by your doctor.  Keep all follow-up visits as told by your doctor. This is important. Medicines   Take over-the-counter and prescription medicines only as told by your doctor. Follow directions carefully.  Do not skip doses of blood pressure medicine. The medicine does not work as well if you skip doses. Skipping doses also puts you at risk for problems.  Ask your doctor about side effects or reactions to medicines that you should watch for. Contact a doctor if:  You think you are having a reaction to the medicine you are taking.  You have headaches that keep coming back (recurring).  You feel dizzy.  You have swelling in your ankles.  You have trouble with your vision. Get help right away  if:  You get a very bad headache.  You start to feel confused.  You feel weak or numb.  You feel faint.  You get very bad pain in your:  Chest.  Belly (abdomen).  You throw up (vomit) more than once.  You have trouble  breathing. Summary  Hypertension is another name for high blood pressure.  Making healthy choices can help lower blood pressure. If your blood pressure cannot be controlled with healthy choices, you may need to take medicine. This information is not intended to replace advice given to you by your health care provider. Make sure you discuss any questions you have with your health care provider. Document Released: 08/06/2007 Document Revised: 01/16/2016 Document Reviewed: 01/16/2016 Elsevier Interactive Patient Education  2017 Elsevier Inc.     Edwina BarthMiguel Shamira Toutant, MD [facility]

## 2016-05-28 NOTE — Patient Instructions (Addendum)
IF you received an x-ray today, you will receive an invoice from [facility]. Please contact [facility] at [phone] with questions or concerns regarding your invoice.   IF you received labwork today, you will receive an invoice from [facility]. Please contact [facility] at [phone] with questions or concerns regarding your invoice.   Our billing staff will not be able to assist you with questions regarding bills from these companies.  You will be contacted with the lab results as soon as they are available. The fastest way to get your results is to activate your My Chart account. Instructions are located on the last page of this paperwork. If you have not heard from us regarding the results in 2 weeks, please contact this office.      Foot Pain Many things can cause foot pain. Some common causes are:  An injury.  A sprain.  Arthritis.  Blisters.  Bunions. Follow these instructions at home: Pay attention to any changes in your symptoms. Take these actions to help with your discomfort:  If directed, put ice on the affected area:  Put ice in a plastic bag.  Place a towel between your skin and the bag.  Leave the ice on for 15-20 minutes, 3?4 times a day for 2 days.  Take over-the-counter and prescription medicines only as told by your health care provider.  Wear comfortable, supportive shoes that fit you well. Do not wear high heels.  Do not stand or walk for long periods of time.  Do not lift a lot of weight. This can put added pressure on your feet.  Do stretches to relieve foot pain and stiffness as told by your health care provider.  Rub your foot gently.  Keep your feet clean and dry. Contact a health care provider if:  Your pain does not get better after a few days of self-care.  Your pain gets worse.  You cannot stand on your foot. Get help right away if:  Your foot is numb or tingling.  Your foot or toes are  swollen.  Your foot or toes turn white or blue.  You have warmth and redness along your foot. This information is not intended to replace advice given to you by your health care provider. Make sure you discuss any questions you have with your health care provider. Document Released: 03/16/2015 Document Revised: 07/26/2015 Document Reviewed: 03/15/2014 Elsevier Interactive Patient Education  2017 [employer]  How to Take Your Blood Pressure You can take your blood pressure at home with a machine. You may need to check your blood pressure at home:  To check if you have high blood pressure (hypertension).  To check your blood pressure over time.  To make sure your blood pressure medicine is working. Supplies needed: You will need a blood pressure machine, or monitor. You can buy one at a drugstore or online. When choosing one:  Choose one with an arm cuff.  Choose one that wraps around your upper arm. Only one finger should fit between your arm and the cuff.  Do not choose one that measures your blood pressure from your wrist or finger. Your doctor can suggest a monitor. How to prepare Avoid these things for 30 minutes before checking your blood pressure:  Drinking caffeine.  Drinking alcohol.  Eating.  Smoking.  Exercising. Five minutes before checking your blood pressure:  Pee.  Sit in a dining chair. Avoid sitting in a soft couch or armchair.  Be quiet. Do  not talk. How to take your blood pressure Follow the instructions that came with your machine. If you have a digital blood pressure monitor, these may be the instructions: 1. Sit up straight. 2. Place your feet on the floor. Do not cross your ankles or legs. 3. Rest your left arm at the level of your heart. You may rest it on a table, desk, or chair. 4. Pull up your shirt sleeve. 5. Wrap the blood pressure cuff around the upper part of your left arm. The cuff should be 1 inch (2.5 cm) above your elbow. It is  best to wrap the cuff around bare skin. 6. Fit the cuff snugly around your arm. You should be able to place only one finger between the cuff and your arm. 7. Put the cord inside the groove of your elbow. 8. Press the power button. 9. Sit quietly while the cuff fills with air and loses air. 10. Write down the numbers on the screen. 11. Wait 2-3 minutes and then repeat steps 1-10. What do the numbers mean? Two numbers make up your blood pressure. The first number is called systolic pressure. The second is called diastolic pressure. An example of a blood pressure reading is "120 over 80" (or 120/80). If you are an adult and do not have a medical condition, use this guide to find out if your blood pressure is normal: Normal   First number: below 120.  Second number: below 80. Elevated   First number: 120-129.  Second number: below 80. Hypertension stage 1   First number: 130-139.  Second number: 80-89. Hypertension stage 2   First number: 140 or above.  Second number: 90 or above. Your blood pressure is above normal even if only the top or bottom number is above normal. Follow these instructions at home:  Check your blood pressure as often as your doctor tells you to.  Take your monitor to your next doctor's appointment. Your doctor will:  Make sure you are using it correctly.  Make sure it is working right.  Make sure you understand what your blood pressure numbers should be.  Tell your doctor if your medicines are causing side effects. Contact a doctor if:  Your blood pressure keeps being high. Get help right away if:  Your first blood pressure number is higher than 180.  Your second blood pressure number is higher than 120. This information is not intended to replace advice given to you by your health care provider. Make sure you discuss any questions you have with your health care provider. Document Released: 01/31/2008 Document Revised: 01/16/2016 Document  Reviewed: 07/27/2015 Elsevier Interactive Patient Education  2017 Elsevier Inc.  Hypertension Hypertension is another name for high blood pressure. High blood pressure forces your heart to work harder to pump blood. This can cause problems over time. There are two numbers in a blood pressure reading. There is a top number (systolic) over a bottom number (diastolic). It is best to have a blood pressure below 120/80. Healthy choices can help lower your blood pressure. You may need medicine to help lower your blood pressure if:  Your blood pressure cannot be lowered with healthy choices.  Your blood pressure is higher than 130/80. Follow these instructions at home: Eating and drinking   If directed, follow the DASH eating plan. This diet includes:  Filling half of your plate at each meal with fruits and vegetables.  Filling one quarter of your plate at each meal with whole grains. Whole  grains include whole wheat pasta, brown rice, and whole grain bread.  Eating or drinking low-fat dairy products, such as skim milk or low-fat yogurt.  Filling one quarter of your plate at each meal with low-fat (lean) proteins. Low-fat proteins include fish, skinless chicken, eggs, beans, and tofu.  Avoiding fatty meat, cured and processed meat, or chicken with skin.  Avoiding premade or processed food.  Eat less than 1,500 mg of salt (sodium) a day.  Limit alcohol use to no more than 1 drink a day for nonpregnant women and 2 drinks a day for men. One drink equals 12 oz of beer, 5 oz of wine, or 1 oz of hard liquor. Lifestyle   Work with your doctor to stay at a healthy weight or to lose weight. Ask your doctor what the best weight is for you.  Get at least 30 minutes of exercise that causes your heart to beat faster (aerobic exercise) most days of the week. This may include walking, swimming, or biking.  Get at least 30 minutes of exercise that strengthens your muscles (resistance exercise) at  least 3 days a week. This may include lifting weights or pilates.  Do not use any products that contain nicotine or tobacco. This includes cigarettes and e-cigarettes. If you need help quitting, ask your doctor.  Check your blood pressure at home as told by your doctor.  Keep all follow-up visits as told by your doctor. This is important. Medicines   Take over-the-counter and prescription medicines only as told by your doctor. Follow directions carefully.  Do not skip doses of blood pressure medicine. The medicine does not work as well if you skip doses. Skipping doses also puts you at risk for problems.  Ask your doctor about side effects or reactions to medicines that you should watch for. Contact a doctor if:  You think you are having a reaction to the medicine you are taking.  You have headaches that keep coming back (recurring).  You feel dizzy.  You have swelling in your ankles.  You have trouble with your vision. Get help right away if:  You get a very bad headache.  You start to feel confused.  You feel weak or numb.  You feel faint.  You get very bad pain in your:  Chest.  Belly (abdomen).  You throw up (vomit) more than once.  You have trouble breathing. Summary  Hypertension is another name for high blood pressure.  Making healthy choices can help lower blood pressure. If your blood pressure cannot be controlled with healthy choices, you may need to take medicine. This information is not intended to replace advice given to you by your health care provider. Make sure you discuss any questions you have with your health care provider. Document Released: 08/06/2007 Document Revised: 01/16/2016 Document Reviewed: 01/16/2016 Elsevier Interactive Patient Education  2017 [employer].

## 2016-06-05 ENCOUNTER — Ambulatory Visit (INDEPENDENT_AMBULATORY_CARE_PROVIDER_SITE_OTHER): Payer: PRIVATE HEALTH INSURANCE | Admitting: Family Medicine

## 2016-06-05 ENCOUNTER — Encounter: Payer: Self-pay | Admitting: Family Medicine

## 2016-06-05 VITALS — BP 142/86 | HR 88 | Temp 98.2°F | Resp 16 | Ht 62.0 in | Wt 253.0 lb

## 2016-06-05 DIAGNOSIS — Z780 Asymptomatic menopausal state: Secondary | ICD-10-CM

## 2016-06-05 DIAGNOSIS — Z23 Encounter for immunization: Secondary | ICD-10-CM | POA: Diagnosis not present

## 2016-06-05 DIAGNOSIS — Z6841 Body Mass Index (BMI) 40.0 and over, adult: Secondary | ICD-10-CM

## 2016-06-05 DIAGNOSIS — I10 Essential (primary) hypertension: Secondary | ICD-10-CM

## 2016-06-05 DIAGNOSIS — K8020 Calculus of gallbladder without cholecystitis without obstruction: Secondary | ICD-10-CM

## 2016-06-05 DIAGNOSIS — M1990 Unspecified osteoarthritis, unspecified site: Secondary | ICD-10-CM

## 2016-06-05 DIAGNOSIS — Z114 Encounter for screening for human immunodeficiency virus [HIV]: Secondary | ICD-10-CM | POA: Diagnosis not present

## 2016-06-05 DIAGNOSIS — E119 Type 2 diabetes mellitus without complications: Secondary | ICD-10-CM

## 2016-06-05 LAB — COMPLETE METABOLIC PANEL WITH GFR
ALBUMIN: 4.1 g/dL (ref 3.6–5.1)
ALK PHOS: 74 U/L (ref 33–115)
ALT: 9 U/L (ref 6–29)
AST: 10 U/L (ref 10–30)
BILIRUBIN TOTAL: 0.2 mg/dL (ref 0.2–1.2)
BUN: 14 mg/dL (ref 7–25)
CALCIUM: 9.4 mg/dL (ref 8.6–10.2)
CO2: 23 mmol/L (ref 20–31)
Chloride: 108 mmol/L (ref 98–110)
Creat: 0.95 mg/dL (ref 0.50–1.10)
GFR, EST NON AFRICAN AMERICAN: 74 mL/min (ref >=60)
GFR, Est African American: 85 mL/min (ref >=60)
GLUCOSE: 108 mg/dL — AB (ref 65–99)
Potassium: 4.1 mmol/L (ref 3.5–5.3)
Sodium: 142 mmol/L (ref 135–146)
TOTAL PROTEIN: 6.7 g/dL (ref 6.1–8.1)

## 2016-06-05 LAB — POCT URINALYSIS DIP (DEVICE)
Bilirubin Urine: NEGATIVE
Glucose, UA: NEGATIVE mg/dL
Ketones, ur: NEGATIVE mg/dL
Leukocytes, UA: NEGATIVE
NITRITE: NEGATIVE
PH: 5.5 (ref 5.0–8.0)
PROTEIN: NEGATIVE mg/dL
Specific Gravity, Urine: 1.025 (ref 1.005–1.030)
Urobilinogen, UA: 0.2 mg/dL (ref 0.0–1.0)

## 2016-06-05 LAB — TSH: TSH: 2.35 mIU/L

## 2016-06-05 LAB — POCT GLYCOSYLATED HEMOGLOBIN (HGB A1C): Hemoglobin A1C: 6.5

## 2016-06-05 MED ORDER — METFORMIN HCL 500 MG PO TABS: 500.0000 mg | ORAL_TABLET | Freq: Every day | ORAL | 1 refills | Status: DC

## 2016-06-05 MED ORDER — MELOXICAM 7.5 MG PO TABS: 7.5000 mg | ORAL_TABLET | Freq: Every day | ORAL | 0 refills | Status: DC

## 2016-06-05 MED ORDER — AMLODIPINE BESYLATE 5 MG PO TABS: 5.0000 mg | ORAL_TABLET | Freq: Every day | ORAL | 1 refills | Status: DC

## 2016-06-05 NOTE — Progress Notes (Signed)
Subjective:    Patient ID: Melinda Ingram, female    DOB: [birth year], 41 y.o.   MRN: [id number]  HPI Melinda Ingram, a 41 year old female with a history of hypertension and obesity presents to establish care. She was a patient of Dr. Maurice Small, but has been lost to follow up due to financial constraints. She was evaluated for hypertension in urgent care on 05/28/2016. She says that she did not start antihypertension medications due to costs. She maintains that she has gained a significant amount of weight over the past year.  She is not exercising and is not adherent to a low fat, low sodium diet. Patient denies chest pain, dyspnea, fatigue, lower extremity edema, orthopnea, palpitations, syncope and tachypnea.  Cardiovascular risk factors includes: obesity (BMI >= 30 kg/m2) and sedentary lifestyle.  Past Medical History:  Diagnosis Date  . GERD (gastroesophageal reflux disease)   . Hypertension    Stopped Lisinopril because of side effects.   Social History   Social History  . Marital status: Divorced    Spouse name: N/A  . Number of children: N/A  . Years of education: N/A   Occupational History  . Not on file.   Social History Main Topics  . Smoking status: Never Smoker  . Smokeless tobacco: Never Used  . Alcohol use Yes     Comment: occausional  . Drug use: No  . Sexual activity: Not Currently    Birth control/ protection: None   Other Topics Concern  . Not on file   Social History Narrative  . No narrative on file   Review of Systems  Constitutional: Positive for fatigue and unexpected weight change (greater than 10 pounds).  HENT: Negative.   Eyes: Negative.   Respiratory: Negative.   Cardiovascular: Negative.   Gastrointestinal: Positive for abdominal pain (history of gall stone).  Endocrine: Positive for polyuria.  Genitourinary: Negative.   Musculoskeletal: Negative.   Skin: Negative.   Allergic/Immunologic: Negative.   Neurological: Positive for  weakness.  Hematological: Negative.   Psychiatric/Behavioral: Negative.        Objective:   Physical Exam  Constitutional: She appears well-nourished.  HENT:  Head: Normocephalic and atraumatic.  Right Ear: External ear normal.  Left Ear: External ear normal.  Nose: Nose normal.  Mouth/Throat: Oropharynx is clear and moist.  Eyes: Conjunctivae are normal. Pupils are equal, round, and reactive to light.  Neck: Normal range of motion. Neck supple.  Cardiovascular: Normal rate and intact distal pulses.   Pulmonary/Chest: Effort normal and breath sounds normal.  Abdominal: Soft. Bowel sounds are normal. There is no tenderness.  Increased abdominal girth  Musculoskeletal: Normal range of motion.  Neurological: She is alert. She has normal reflexes.  Skin: Skin is warm and dry.  Psychiatric: She has a normal mood and affect. Her behavior is normal. Judgment and thought content normal.      BP (!) 142/86 (BP Location: Left Arm, Patient Position: Sitting, Cuff Size: Large)   Pulse 88   Temp 98.2 F (36.8 C) (Oral)   Resp 16   Ht  (1.575 m)   Wt 253 lb (114.8 kg)   LMP 05/07/2016   SpO2 98%   BMI 46.27 kg/m  Assessment & Plan:  1. Essential hypertension Blood pressure is above goal. Will start Amlodipine 5 mg daily. She will follow up for hypertension in 1 month.  - COMPLETE METABOLIC PANEL WITH GFR - amLODipine (NORVASC) 5 MG tablet; Take 1 tablet (5 mg  total) by mouth daily.  Dispense: 90 tablet; Refill: 1 - POCT urinalysis dip (device)  2. Morbid obesity with BMI of 45.0-49.9, adult (HCC) Recommend a lowfat, low carbohydrate diet divided over 5-6 small meals, increase water intake to 6-8 glasses, and 150 minutes per week of cardiovascular exercise.   - TSH - HgB A1c - Lipid panel; Future  3. Need for Tdap vaccination - Tdap vaccine greater than or equal to 7yo IM  4. Screening for HIV (human immunodeficiency virus) - HIV antibody (with reflex)  5. Arthritis -  meloxicam (MOBIC) 7.5 MG tablet; Take 1 tablet (7.5 mg total) by mouth daily.  Dispense: 30 tablet; Refill: 0  6. Controlled type 2 diabetes mellitus without complication, without long-term current use of insulin (HCC) - POCT urinalysis dip (device) - metFORMIN (GLUCOPHAGE) 500 MG tablet; Take 1 tablet (500 mg total) by mouth daily with breakfast.  Dispense: 90 tablet; Refill: 1  7. Calculus of gallbladder without cholecystitis without obstruction Patient has a history of gall stones. She has frequent right sided abdominal pain. Reviewed previous ultrasound.  - US Abdomen Complete; Future  8. Menopause Menopause is the time that marks the end of your menstrual cycles. It's diagnosed after you've gone 12 months without a menstrual period.  Menopause can happen in your 67s or 51s, but the average age is 18 in the [place]. Menopause is a natural biological process. But the physical symptoms, such as hot flashes, and emotional symptoms of menopause may disrupt your sleep, lower your energy or affect emotional health.  There are many effective treatments available, from lifestyle adjustments to hormone therapy.    RTC: 1 month for pap smear and f/u for hypertension   Nolon Nations  MSN, FNP-C [facility] [address] [place], [place] [address] [phone]

## 2016-06-05 NOTE — Patient Instructions (Addendum)
Menopause: Black cohosh daily Cool hot flashes. Dress in layers, have a cold glass of water or go somewhere cooler. Try to pinpoint what triggers your hot flashes. For many women, triggers may include hot beverages, caffeine, spicy foods, alcohol, stress, hot weather and even a warm room.  Decrease vaginal discomfort. Use over-the-counter, water-based vaginal lubricants (Astroglide, K-Y jelly, others), silicone-based lubricants or moisturizers (Replens, others). Choose products that don't contain glycerin, which can cause burning or irritation in women who are sensitive to that chemical. Staying sexually active also helps by increasing blood flow to the vagina.  Get enough sleep. Avoid caffeine, which can make it hard to get to sleep, and avoid drinking too much alcohol, which can interrupt sleep. Exercise during the day, although not right before bedtime. If hot flashes disturb your sleep, you may need to find a way to manage them before you can get adequate rest.  Practice relaxation techniques. Techniques such as deep breathing, paced breathing, guided imagery, massage and progressive muscle relaxation may help with menopausal symptoms. You can find a number of books, CDs and online offerings on different relaxation exercises.  Strengthen your pelvic floor. Pelvic floor muscle exercises, called Kegel exercises, can improve some forms of urinary incontinence.  Eat a balanced diet. Include a variety of fruits, vegetables and whole grains. Limit saturated fats, oils and sugars. Ask your provider if you need calcium or vitamin D supplements to help meet daily requirements.  Don't smoke. Smoking increases your risk of heart disease, stroke, osteoporosis, cancer and a range of other health problems. It may also increase hot flashes and bring on earlier menopause.  Exercise regularly. Get regular physical activity or exercise on most days to help protect against heart disease, diabetes, osteoporosis and other  conditions associated with aging.  Hypertension: Amlodipine 5 mg daily Arthritis: Meloxicam 7.5 mg with food Gallstones: Will schedule an abdominal ultrasound Metformin 500 mg daily with breakfast for prediabetes   Recommend a lowfat, low carbohydrate diet divided over 5-6 small meals, increase water intake to 6-8 glasses, and 150 minutes per week of cardiovascular exercise.    Menopause and Herbal Products What is menopause? Menopause is the normal time of life when menstrual periods decrease in frequency and eventually stop completely. This process can take several years for some women. Menopause is complete when you have had an absence of menstruation for a full year since your last menstrual period. It usually occurs between the ages of 33 and 37. It is not common for menopause to begin before the age of 35. During menopause, your body stops producing the female hormones estrogen and progesterone. Common symptoms associated with this loss of hormones (vasomotor symptoms) are:  Hot flashes.  Hot flushes.  Night sweats. Other common symptoms and complications of menopause include:  Decrease in sex drive.  Vaginal dryness and thinning of the walls of the vagina. This can make sex painful.  Dryness of the skin and development of wrinkles.  Headaches.  Tiredness.  Irritability.  Memory problems.  Weight gain.  Bladder infections.  Hair growth on the face and chest.  Inability to reproduce offspring (infertility).  Loss of density in the bones (osteoporosis) increasing your risk for breaks (fractures).  Depression.  Hardening and narrowing of the arteries (atherosclerosis). This increases your risk of heart attack and stroke. What treatment options are available? There are many treatment choices for menopause symptoms. The most common treatment is hormone replacement therapy. Many alternative therapies for menopause are emerging, including the  use of herbal products.  These supplements can be found in the form of herbs, teas, oils, tinctures, and pills. Common herbal supplements for menopause are made from plants that contain phytoestrogens. Phytoestrogens are compounds that occur naturally in plants and plant products. They act like estrogen in the body. Foods and herbs that contain phytoestrogens include:  Soy.  Flax seeds.  Red clover.  Ginseng. What menopause symptoms may be helped if I use herbal products?  Vasomotor symptoms. These may be helped by:  Soy. Some studies show that soy may have a moderate benefit for hot flashes.  Black cohosh. There is limited evidence indicating this may be beneficial for hot flashes.  Symptoms that are related to heart and blood vessel disease. These may be helped by soy. Studies have shown that soy can help to lower cholesterol.  Depression. This may be helped by:  St. John's wort. There is limited evidence that shows this may help mild to moderate depression.  Black cohosh. There is evidence that this may help depression and mood swings.  Osteoporosis. Soy may help to decrease bone loss that is associated with menopause and may prevent osteoporosis. Limited evidence indicates that red clover may offer some bone loss protection as well. Other herbal products that are commonly used during menopause lack enough evidence to support their use as a replacement for conventional menopause therapies. These products include evening primrose, ginseng, and red clover. What are the cases when herbal products should not be used during menopause? Do not use herbal products during menopause without your health care provider's approval if:  You are taking medicine.  You have a preexisting liver condition. Are there any risks in my taking herbal products during menopause? If you choose to use herbal products to help with symptoms of menopause, keep in mind that:  Different supplements have different and unmeasured  amounts of herbal ingredients.  Herbal products are not regulated the same way that medicines are.  Concentrations of herbs may vary depending on the way they are prepared. For example, the concentration may be different in a pill, tea, oil, and tincture.  Little is known about the risks of using herbal products, particularly the risks of long-term use.  Some herbal supplements can be harmful when combined with certain medicines. Most commonly reported side effects of herbal products are mild. However, if used improperly, many herbal supplements can cause serious problems. Talk to your health care provider before starting any herbal product. If problems develop, stop taking the supplement and let your health care provider know. This information is not intended to replace advice given to you by your health care provider. Make sure you discuss any questions you have with your health care provider. Document Released: 08/06/2007 Document Revised: 01/15/2016 Document Reviewed: 08/02/2013 Elsevier Interactive Patient Education  2017 [place]. Menopause and Herbal Products What is menopause? Menopause is the normal time of life when menstrual periods decrease in frequency and eventually stop completely. This process can take several years for some women. Menopause is complete when you have had an absence of menstruation for a full year since your last menstrual period. It usually occurs between the ages of 59 and 23. It is not common for menopause to begin before the age of 64. During menopause, your body stops producing the female hormones estrogen and progesterone. Common symptoms associated with this loss of hormones (vasomotor symptoms) are:  Hot flashes.  Hot flushes.  Night sweats. Other common symptoms and complications of menopause include:  Decrease in sex drive.  Vaginal dryness and thinning of the walls of the vagina. This can make sex painful.  Dryness of the skin and  development of wrinkles.  Headaches.  Tiredness.  Irritability.  Memory problems.  Weight gain.  Bladder infections.  Hair growth on the face and chest.  Inability to reproduce offspring (infertility).  Loss of density in the bones (osteoporosis) increasing your risk for breaks (fractures).  Depression.  Hardening and narrowing of the arteries (atherosclerosis). This increases your risk of heart attack and stroke. What treatment options are available? There are many treatment choices for menopause symptoms. The most common treatment is hormone replacement therapy. Many alternative therapies for menopause are emerging, including the use of herbal products. These supplements can be found in the form of herbs, teas, oils, tinctures, and pills. Common herbal supplements for menopause are made from plants that contain phytoestrogens. Phytoestrogens are compounds that occur naturally in plants and plant products. They act like estrogen in the body. Foods and herbs that contain phytoestrogens include:  Soy.  Flax seeds.  Red clover.  Ginseng. What menopause symptoms may be helped if I use herbal products?  Vasomotor symptoms. These may be helped by:  Soy. Some studies show that soy may have a moderate benefit for hot flashes.  Black cohosh. There is limited evidence indicating this may be beneficial for hot flashes.  Symptoms that are related to heart and blood vessel disease. These may be helped by soy. Studies have shown that soy can help to lower cholesterol.  Depression. This may be helped by:  St. John's wort. There is limited evidence that shows this may help mild to moderate depression.  Black cohosh. There is evidence that this may help depression and mood swings.  Osteoporosis. Soy may help to decrease bone loss that is associated with menopause and may prevent osteoporosis. Limited evidence indicates that red clover may offer some bone loss protection as  well. Other herbal products that are commonly used during menopause lack enough evidence to support their use as a replacement for conventional menopause therapies. These products include evening primrose, ginseng, and red clover. What are the cases when herbal products should not be used during menopause? Do not use herbal products during menopause without your health care provider's approval if:  You are taking medicine.  You have a preexisting liver condition. Are there any risks in my taking herbal products during menopause? If you choose to use herbal products to help with symptoms of menopause, keep in mind that:  Different supplements have different and unmeasured amounts of herbal ingredients.  Herbal products are not regulated the same way that medicines are.  Concentrations of herbs may vary depending on the way they are prepared. For example, the concentration may be different in a pill, tea, oil, and tincture.  Little is known about the risks of using herbal products, particularly the risks of long-term use.  Some herbal supplements can be harmful when combined with certain medicines. Most commonly reported side effects of herbal products are mild. However, if used improperly, many herbal supplements can cause serious problems. Talk to your health care provider before starting any herbal product. If problems develop, stop taking the supplement and let your health care provider know. This information is not intended to replace advice given to you by your health care provider. Make sure you discuss any questions you have with your health care provider. Document Released: 08/06/2007 Document Revised: 01/15/2016 Document Reviewed: 08/02/2013 Elsevier Interactive Patient  Education  2017 [place] for Metabolic Syndrome Metabolic syndrome is a disorder that includes at least three of these conditions:  Abdominal obesity.  Too much sugar in your blood.  High blood  pressure.  Higher than normal amount of fat (lipids) in your blood.  Lower than normal level of "good" cholesterol (HDL). Following a healthy diet can help to keep metabolic syndrome under control. It can also help to prevent the development of conditions that are associated with metabolic syndrome, such as diabetes, heart disease, and stroke. Along with exercise, a healthy diet:  Helps to improve the way that the body uses insulin.  Promotes weight loss. A common goal for people with this condition is to lose at least 7 to 10 percent of their starting weight. What do I need to know about this diet?  Use the glycemic index (GI) to plan your meals. The index tells you how quickly a food will raise your blood sugar. Choose foods that have low GI values. These foods take a longer time to raise blood sugar.  Keep track of how many calories you take in. Eating the right amount of calories will help your achieve a healthy weight.  You may want to follow a Mediterranean diet. This diet includes lots of vegetables, lean meats or fish, whole grains, fruits, and healthy oils and fats. What foods can I eat? Grains  Stone-ground whole wheat. Pumpernickel bread. Whole-grain bread, crackers, tortillas, cereal, and pasta. Unsweetened oatmeal.Bulgur.Barley.Quinoa.Brown rice or wild rice. Vegetables  Lettuce. Spinach. Peas. Beets. Cauliflower. Cabbage. Broccoli. Carrots. Tomatoes. Squash. Eggplant. Herbs. Peppers. Onions. Cucumbers. Brussels sprouts. Sweet potatoes. Yams. Beans. Lentils. Fruits  Berries. Apples. Oranges. Grapes. Mango. Pomegranate. Kiwi. Cherries. Meats and Other Protein Sources  Seafood and shellfish. Lean meats.Poultry. Tofu. Dairy  Low-fat or fat-free dairy products, such as milk, yogurt, and cheese. Beverages  Water. Low-fat milk. Milk alternatives, like soy milk or almond milk. Real fruit juice. Condiments  Low-sugar or sugar-free ketchup, barbecue sauce, and mayonnaise.  Mustard. Relish. Fats and Oils  Avocado. Canola or olive oil. Nuts and nut butters.Seeds. The items listed above may not be a complete list of recommended foods or beverages. Contact your dietitian for more options.  What foods are not recommended? Red meat. Palm oil and coconut oil. Processed foods. Fried foods. Alcohol. Sweetened drinks, such as iced tea and soda. Sweets. Salty foods. The items listed above may not be a complete list of foods and beverages to avoid. Contact your dietitian for more information.  This information is not intended to replace advice given to you by your health care provider. Make sure you discuss any questions you have with your health care provider. Document Released: 07/04/2014 Document Revised: 06/29/2015 Document Reviewed: 03/01/2014 Elsevier Interactive Patient Education  2017 [place]. Metformin tablets What is this medicine? METFORMIN (met FOR min) is used to treat type 2 diabetes. It helps to control blood sugar. Treatment is combined with diet and exercise. This medicine can be used alone or with other medicines for diabetes. This medicine may be used for other purposes; ask your health care provider or pharmacist if you have questions. COMMON BRAND NAME(S): Glucophage What should I tell my health care provider before I take this medicine? They need to know if you have any of these conditions: -anemia -dehydration -heart disease -frequently drink alcohol-containing beverages -kidney disease -liver disease -polycystic ovary syndrome -serious infection or injury -vomiting -an unusual or allergic reaction to metformin, other medicines, foods, dyes, or preservatives -  pregnant or trying to get pregnant -breast-feeding How should I use this medicine? Take this medicine by mouth with a glass of water. Follow the directions on the prescription label. Take this medicine with food. Take your medicine at regular intervals. Do not take your medicine  more often than directed. Do not stop taking except on your doctor's advice. Talk to your pediatrician regarding the use of this medicine in children. While this drug may be prescribed for children as young as 50 years of age for selected conditions, precautions do apply. Overdosage: If you think you have taken too much of this medicine contact a poison control center or emergency room at once. NOTE: This medicine is only for you. Do not share this medicine with others. What if I miss a dose? If you miss a dose, take it as soon as you can. If it is almost time for your next dose, take only that dose. Do not take double or extra doses. What may interact with this medicine? Do not take this medicine with any of the following medications: -dofetilide -certain contrast medicines given before X-rays, CT scans, MRI, or other procedures This medicine may also interact with the following medications: -acetazolamide -certain antiviral medicines for HIV or AIDS or for hepatitis, like adefovir, dolutegravir, emtricitabine, entecavir, lamivudine, paritaprevir, or tenofovir -cimetidine -cobicistat -crizotinib -dichlorphenamide -digoxin -diuretics -female hormones, like estrogens or progestins and birth control pills -glycopyrrolate -isoniazid -lamotrigine -medicines for blood pressure, heart disease, irregular heart beat -memantine -midodrine -methazolamide -morphine -niacin -phenothiazines like chlorpromazine, mesoridazine, prochlorperazine, thioridazine -phenytoin -procainamide -propantheline -quinidine -quinine -ranitidine -ranolazine -steroid medicines like prednisone or cortisone -stimulant medicines for attention disorders, weight loss, or to stay awake -thyroid medicines -topiramate -trimethoprim -trospium -vancomycin -vandetanib -zonisamide This list may not describe all possible interactions. Give your health care provider a list of all the medicines, herbs, non-prescription  drugs, or dietary supplements you use. Also tell them if you smoke, drink alcohol, or use illegal drugs. Some items may interact with your medicine. What should I watch for while using this medicine? Visit your doctor or health care professional for regular checks on your progress. A test called the HbA1C (A1C) will be monitored. This is a simple blood test. It measures your blood sugar control over the last 2 to 3 months. You will receive this test every 3 to 6 months. Learn how to check your blood sugar. Learn the symptoms of low and high blood sugar and how to manage them. Always carry a quick-source of sugar with you in case you have symptoms of low blood sugar. Examples include hard sugar candy or glucose tablets. Make sure others know that you can choke if you eat or drink when you develop serious symptoms of low blood sugar, such as seizures or unconsciousness. They must get medical help at once. Tell your doctor or health care professional if you have high blood sugar. You might need to change the dose of your medicine. If you are sick or exercising more than usual, you might need to change the dose of your medicine. Do not skip meals. Ask your doctor or health care professional if you should avoid alcohol. Many nonprescription cough and cold products contain sugar or alcohol. These can affect blood sugar. This medicine may cause ovulation in premenopausal women who do not have regular monthly periods. This may increase your chances of becoming pregnant. You should not take this medicine if you become pregnant or think you may be pregnant. Talk with your doctor  or health care professional about your birth control options while taking this medicine. Contact your doctor or health care professional right away if think you are pregnant. If you are going to need surgery, a MRI, CT scan, or other procedure, tell your doctor that you are taking this medicine. You may need to stop taking this medicine  before the procedure. Wear a medical ID bracelet or chain, and carry a card that describes your disease and details of your medicine and dosage times. What side effects may I notice from receiving this medicine? Side effects that you should report to your doctor or health care professional as soon as possible: -allergic reactions like skin rash, itching or hives, swelling of the face, lips, or tongue -breathing problems -feeling faint or lightheaded, falls -muscle aches or pains -signs and symptoms of low blood sugar such as feeling anxious, confusion, dizziness, increased hunger, unusually weak or tired, sweating, shakiness, cold, irritable, headache, blurred vision, fast heartbeat, loss of consciousness -slow or irregular heartbeat -unusual stomach pain or discomfort -unusually tired or weak Side effects that usually do not require medical attention (report to your doctor or health care professional if they continue or are bothersome): -diarrhea -headache -heartburn -metallic taste in mouth -nausea -stomach gas, upset This list may not describe all possible side effects. Call your doctor for medical advice about side effects. You may report side effects to FDA at 1-800-FDA-1088. Where should I keep my medicine? Keep out of the reach of children. Store at room temperature between 15 and 30 degrees C (59 and 86 degrees F). Protect from moisture and light. Throw away any unused medicine after the expiration date. NOTE: This sheet is a summary. It may not cover all possible information. If you have questions about this medicine, talk to your doctor, pharmacist, or health care provider.  2018 Elsevier/[employer] Standard (2015-08-29 15:34:19)

## 2016-06-06 LAB — HIV ANTIBODY (ROUTINE TESTING W REFLEX): HIV: NONREACTIVE

## 2016-06-12 ENCOUNTER — Ambulatory Visit (HOSPITAL_COMMUNITY)
Admission: RE | Admit: 2016-06-12 | Discharge: 2016-06-12 | Disposition: A | Discharge status: Home / Self Care | Payer: 59 | Source: Ambulatory Visit | Attending: Family Medicine | Admitting: Family Medicine

## 2016-06-12 DIAGNOSIS — K8020 Calculus of gallbladder without cholecystitis without obstruction: Secondary | ICD-10-CM | POA: Diagnosis present

## 2016-06-16 ENCOUNTER — Telehealth: Payer: Self-pay | Admitting: Family Medicine

## 2016-06-16 NOTE — Telephone Encounter (Signed)
Reviewed abdominal ultrasound, probable gallbladder calculi. Inconclusive due to shadowing. Recommend a lowfat, low carbohydrate diet divided over 5-6 small meals, increase water intake to 6-8 glasses, and 150 minutes per week of cardiovascular exercise.    The patient was given clear instructions to go to ER or return to medical center if symptoms do not improve, worsen or new problems develop. The patient verbalized understanding.      Nolon Nations  MSN, FNP-C [facility] [address] [place], [place] [address] [phone]

## 2016-07-10 ENCOUNTER — Ambulatory Visit: Payer: PRIVATE HEALTH INSURANCE | Admitting: Family Medicine

## 2016-07-14 ENCOUNTER — Telehealth: Payer: Self-pay

## 2016-07-14 NOTE — Telephone Encounter (Signed)
Called no answer. Voicemail was full. When patient returns call she will need to come in for a bp check if blood pressure is still high and she is taking medication consistently.  Thanks!

## 2016-07-17 ENCOUNTER — Ambulatory Visit: Payer: PRIVATE HEALTH INSURANCE

## 2016-07-17 VITALS — BP 138/80

## 2016-07-17 DIAGNOSIS — I10 Essential (primary) hypertension: Secondary | ICD-10-CM

## 2016-07-22 ENCOUNTER — Telehealth: Payer: Self-pay

## 2016-07-22 NOTE — Telephone Encounter (Signed)
Called and left message advising patient that we only check for blood type when patient is about to receive blood. If any questions to call us back. Thanks!

## 2016-08-07 ENCOUNTER — Other Ambulatory Visit (HOSPITAL_COMMUNITY)
Admission: RE | Admit: 2016-08-07 | Discharge: 2016-08-07 | Disposition: A | Discharge status: Home / Self Care | Payer: PRIVATE HEALTH INSURANCE | Source: Ambulatory Visit | Attending: Family Medicine | Admitting: Family Medicine

## 2016-08-07 ENCOUNTER — Ambulatory Visit (INDEPENDENT_AMBULATORY_CARE_PROVIDER_SITE_OTHER): Payer: PRIVATE HEALTH INSURANCE | Admitting: Family Medicine

## 2016-08-07 ENCOUNTER — Encounter: Payer: Self-pay | Admitting: Family Medicine

## 2016-08-07 VITALS — BP 142/86 | HR 77 | Temp 98.4°F | Resp 16 | Ht 62.0 in | Wt 247.0 lb

## 2016-08-07 DIAGNOSIS — E119 Type 2 diabetes mellitus without complications: Secondary | ICD-10-CM | POA: Diagnosis not present

## 2016-08-07 DIAGNOSIS — Z01419 Encounter for gynecological examination (general) (routine) without abnormal findings: Secondary | ICD-10-CM | POA: Diagnosis not present

## 2016-08-07 DIAGNOSIS — Z1231 Encounter for screening mammogram for malignant neoplasm of breast: Secondary | ICD-10-CM

## 2016-08-07 DIAGNOSIS — Z23 Encounter for immunization: Secondary | ICD-10-CM

## 2016-08-07 DIAGNOSIS — Z1239 Encounter for other screening for malignant neoplasm of breast: Secondary | ICD-10-CM

## 2016-08-07 LAB — POCT URINALYSIS DIP (DEVICE)
Bilirubin Urine: NEGATIVE
GLUCOSE, UA: NEGATIVE mg/dL
Hgb urine dipstick: NEGATIVE
Ketones, ur: NEGATIVE mg/dL
Leukocytes, UA: NEGATIVE
Nitrite: NEGATIVE
PH: 6 (ref 5.0–8.0)
PROTEIN: NEGATIVE mg/dL
Specific Gravity, Urine: 1.025 (ref 1.005–1.030)
UROBILINOGEN UA: 0.2 mg/dL (ref 0.0–1.0)

## 2016-08-07 LAB — POCT GLYCOSYLATED HEMOGLOBIN (HGB A1C): HEMOGLOBIN A1C: 6

## 2016-08-07 LAB — RESULTS CONSOLE HPV: CHL HPV: NEGATIVE

## 2016-08-07 NOTE — Progress Notes (Signed)
Melinda Ingram, a 41 year old patient with a history of type 2 diabetes, hypertension, and morbid obesity presents for a follow up of DMII and routine gynecological exam.   She was diagnosed with type 2 diabetes mellitus 1 month ago. Swan was started on Metformin 500 mg twice daily. She has been taking medication consistently. She has not been exercising or following a carbohydrate modified diet. She denies fatigue, weight loss, abdominal pain, neuropathy, dysuria, polyuria, polydipsia, or polyphagia.   Melinda Ingram  Is also here for a routine gynecological examination. She reports a history of abnormal pap smears. She has a history of a colposcopy due to dysplasia. She says that menstrual periods are typically normal. She denies a history of sexually transmitted infections. She has never had a mammogram and does not perform monthly self-breast examinations. She  has a  family history of breast cancer. He maternal and paternal aunts both  had breast cancer.    Past Medical History:  Diagnosis Date  . GERD (gastroesophageal reflux disease)   . Hypertension    Stopped Lisinopril because of side effects.   Social History   Social History  . Marital status: Divorced    Spouse name: N/A  . Number of children: N/A  . Years of education: N/A   Occupational History  . Not on file.   Social History Main Topics  . Smoking status: Never Smoker  . Smokeless tobacco: Never Used  . Alcohol use Yes     Comment: occausional  . Drug use: No  . Sexual activity: Not Currently    Birth control/ protection: None   Other Topics Concern  . Not on file   Social History Narrative  . No narrative on file   Immunization History  Administered Date(s) Administered  . Pneumococcal Polysaccharide-23 08/07/2016  . Tdap 06/05/2016   Allergies  Allergen Reactions  . Lisinopril Cough   Review of Systems  Constitutional: Negative.  Negative for fever.  HENT: Negative.   Eyes: Negative.   Respiratory:  Negative.   Cardiovascular: Negative for chest pain and palpitations.  Gastrointestinal: Negative.  Negative for abdominal pain and anorexia.  Genitourinary: Negative.  Negative for dysuria, flank pain, missed menses, pelvic pain, urgency and vaginal discharge.  Musculoskeletal: Negative for joint pain.  Skin: Negative.   Neurological: Negative.   Psychiatric/Behavioral: Negative.     Physical Exam  HENT:  Head: Normocephalic and atraumatic.  Right Ear: External ear normal.  Mouth/Throat: Oropharynx is clear and moist.  Eyes: Conjunctivae are normal. Pupils are equal, round, and reactive to light.  Neck: Normal range of motion. Neck supple.  Cardiovascular: Normal rate, regular rhythm, normal heart sounds and intact distal pulses.   Pulmonary/Chest: Effort normal and breath sounds normal.  Abdominal: Bowel sounds are normal.  Genitourinary: Uterus normal, cervix normal, right adnexa normal and vulva normal. Vagina exhibits normal mucosa, no exudate and no lesion. Watery  thin and vaginal discharge found.    BP (!) 142/86 (BP Location: Right Arm, Patient Position: Sitting, Cuff Size: Large) Comment: manually  Pulse 77   Temp 98.4 F (36.9 C) (Oral)   Resp 16   Ht 5\' 2"  (1.575 m)   Wt 247 lb (112 kg)   LMP 07/24/2016   SpO2 100%   BMI 45.18 kg/m  1. Pap test, as part of routine gynecological examination - Cytology - PAP [place]  2. Screening breast examination No lesions, bruising, or palpable lumps. Discussed monthly self breast exams.   3. Breast cancer  screening - MM Digital Screening; Future  4. Type 2 diabetes mellitus without complication, without long-term current use of insulin (HCC) Hemoglobin a1C has decreased to 6.0. Recommend a lowfat, low carbohydrate diet divided over 5-6 small meals, increase water intake to 6-8 glasses, and 150 minutes per week of cardiovascular exercise.   - HgB A1c  5. Immunization due - Pneumococcal polysaccharide vaccine  23-valent greater than or equal to 2yo subcutaneous/IM   RTC: 3 months for chronic conditions. Will follow up by phone with any abnormal laboratory results.    Nolon Nations  MSN, FNP-C [facility] [address] [place], [place] [address] [phone]

## 2016-08-07 NOTE — Patient Instructions (Addendum)
Type 2 diabetes mellitus:  Hemoglobin a1C has decreased 6.0. Will continue Metformin 500 mg twice daily  Hypertension:   Continue Amlodipine as prescribed.   Recommend a lowfat, low carbohydrate diet divided over 5-6 small meals, increase water intake to 6-8 glasses, and 150 minutes per week of cardiovascular exercise.     Recommend monthly self breast exams.  Breast Self-Awareness Breast self-awareness means:  Knowing how your breasts look.  Knowing how your breasts feel.  Checking your breasts every month for changes.  Telling your doctor if you notice a change in your breasts.  Breast self-awareness allows you to notice a breast problem early while it is still small. How to do a breast self-exam One way to learn what is normal for your breasts and to check for changes is to do a breast self-exam. To do a breast self-exam: Look for Changes  1. Take off all the clothes above your waist. 2. Stand in front of a mirror in a room with good lighting. 3. Put your hands on your hips. 4. Push your hands down. 5. Look at your breasts and nipples in the mirror to see if one breast or nipple looks different than the other. Check to see if: ? The shape of one breast is different. ? The size of one breast is different. ? There are wrinkles, dips, and bumps in one breast and not the other. 6. Look at each breast for changes in your skin, such as: ? Redness. ? Scaly areas. 7. Look for changes in your nipples, such as: ? Liquid around the nipples. ? Bleeding. ? Dimpling. ? Redness. ? A change in where the nipples are. Feel for Changes 1. Lie on your back on the floor. 2. Feel each breast. To do this, follow these steps: ? Pick a breast to feel. ? Put the arm closest to that breast above your head. ? Use your other arm to feel the nipple area of your breast. Feel the area with the pads of your three middle fingers by making small circles with your fingers. For the first circle,  press lightly. For the second circle, press harder. For the third circle, press even harder. ? Keep making circles with your fingers at the light, harder, and even harder pressures as you move down your breast. Stop when you feel your ribs. ? Move your fingers a little toward the center of your body. ? Start making circles with your fingers again, this time going up until you reach your collarbone. ? Keep making up and down circles until you reach your armpit. Remember to keep using the three pressures. ? Feel the other breast in the same way. 3. Sit or stand in the shower or tub. 4. With soapy water on your skin, feel each breast the same way you did in step 2, when you were lying on the floor. Write Down What You Find  After doing the self-exam, write down:  What is normal for each breast.  Any changes you find in each breast.  When you last had your period.  How often should I check my breasts? Check your breasts every month. If you are breastfeeding, the best time to check them is after you feed your baby or after you use a breast pump. If you get periods, the best time to check your breasts is 5-7 days after your period is over. When should I see my doctor? See your doctor if you notice:  A change in shape or  size of your breasts or nipples.  A change in the skin of your breast or nipples, such as red or scaly skin.  Unusual fluid coming from your nipples.  A lump or thick area that was not there before.  Pain in your breasts.  Anything that concerns you.  This information is not intended to replace advice given to you by your health care provider. Make sure you discuss any questions you have with your health care provider. Document Released: 08/06/2007 Document Revised: 07/26/2015 Document Reviewed: 01/07/2015 Elsevier Interactive Patient Education  2018 [employer]. Pap Test Why am I having this test? A pap test is sometimes called a pap smear. It is a screening test  that is used to check for signs of cancer of the vagina, cervix, and uterus. The test can also identify the presence of infection or precancerous changes. Your health care provider will likely recommend you have this test done on a regular basis. This test may be done:  Every 3 years, starting at age 32.  Every 5 years, in combination with testing for the presence of human papillomavirus (HPV).  More or less often depending on other medical conditions.  What kind of sample is taken? Using a small cotton swab, plastic spatula, or brush, your health care provider will collect a sample of cells from the surface of your cervix. Your cervix is the opening to your uterus, also called a womb. Secretions from the cervix and vagina may also be collected. How do I prepare for this test?  Be aware of where you are in your menstrual cycle. You may be asked to reschedule the test if you are menstruating on the day of the test.  You may need to reschedule if you have a known vaginal infection on the day of the test.  You may be asked to avoid douching or taking a bath the day before or the day of the test.  Some medicines can cause abnormal test results, such as digitalis and tetracycline. Talk with your health care provider before your test if you take one of these medicines. What do the results mean? Abnormal test results may indicate a number of health conditions. These may include:  Cancer. Although pap test results cannot be used to diagnose cancer of the cervix, vagina, or uterus, they may suggest the possibility of cancer. Further tests would be required to determine if cancer is present.  Sexually transmitted disease.  Fungal infection.  Parasite infection.  Herpes infection.  A condition causing or contributing to infertility.  It is your responsibility to obtain your test results. Ask the lab or department performing the test when and how you will get your results. Contact your health  care provider to discuss any questions you have about your results. Talk with your health care provider to discuss your results, treatment options, and if necessary, the need for more tests. Talk with your health care provider if you have any questions about your results. This information is not intended to replace advice given to you by your health care provider. Make sure you discuss any questions you have with your health care provider. Document Released: 05/10/2002 Document Revised: 10/24/2015 Document Reviewed: 07/11/2013 Elsevier Interactive Patient Education  [employer].

## 2016-08-12 LAB — CYTOLOGY - PAP
BACTERIAL VAGINITIS: NEGATIVE
CANDIDA VAGINITIS: NEGATIVE
CHLAMYDIA, DNA PROBE: NEGATIVE
DIAGNOSIS: NEGATIVE
HPV (WINDOPATH): NOT DETECTED
NEISSERIA GONORRHEA: NEGATIVE

## 2016-08-14 ENCOUNTER — Telehealth: Payer: Self-pay

## 2016-08-14 NOTE — Telephone Encounter (Signed)
Called, no answer and no voicemail. Will try later Thanks!  

## 2016-08-14 NOTE — Telephone Encounter (Signed)
-----   Message from Massie MaroonLachina M Hollis, [place]NP sent at 08/14/2016 11:56 AM EDT ----- Regarding: lab results Please inform Melinda Ingram that pap smear is negative. Will repeat pap smear in 3 years per current guidelines.   Thanks ----- Message ----- From: Loney HeringBatten, Asif Muchow E, LPN Sent: 2/9/56216/08/2016  10:41 AM To: Massie MaroonLachina M Hollis, FNP

## 2016-08-15 NOTE — Telephone Encounter (Signed)
I have been un able to contact patient by phone will mail letter. Thanks!

## 2016-08-15 NOTE — Telephone Encounter (Signed)
Called, no answer and no voicemail. Will try later Thanks!  

## 2016-11-07 ENCOUNTER — Ambulatory Visit: Payer: PRIVATE HEALTH INSURANCE | Admitting: Family Medicine

## 2016-12-30 ENCOUNTER — Emergency Department (HOSPITAL_COMMUNITY): Payer: 59

## 2016-12-30 ENCOUNTER — Emergency Department (HOSPITAL_COMMUNITY)
Admission: EM | Admit: 2016-12-30 | Discharge: 2016-12-30 | Disposition: A | Discharge status: Home / Self Care | Payer: 59 | Attending: Emergency Medicine | Admitting: Emergency Medicine

## 2016-12-30 ENCOUNTER — Encounter (HOSPITAL_COMMUNITY): Payer: Self-pay | Admitting: Emergency Medicine

## 2016-12-30 DIAGNOSIS — E119 Type 2 diabetes mellitus without complications: Secondary | ICD-10-CM | POA: Insufficient documentation

## 2016-12-30 DIAGNOSIS — K8020 Calculus of gallbladder without cholecystitis without obstruction: Secondary | ICD-10-CM | POA: Diagnosis not present

## 2016-12-30 DIAGNOSIS — R112 Nausea with vomiting, unspecified: Secondary | ICD-10-CM | POA: Insufficient documentation

## 2016-12-30 DIAGNOSIS — I10 Essential (primary) hypertension: Secondary | ICD-10-CM | POA: Insufficient documentation

## 2016-12-30 DIAGNOSIS — R1011 Right upper quadrant pain: Secondary | ICD-10-CM

## 2016-12-30 HISTORY — DX: Calculus of gallbladder without cholecystitis without obstruction: K80.20

## 2016-12-30 HISTORY — DX: Type 2 diabetes mellitus without complications: E11.9

## 2016-12-30 LAB — URINALYSIS, ROUTINE W REFLEX MICROSCOPIC
BILIRUBIN URINE: NEGATIVE
GLUCOSE, UA: NEGATIVE mg/dL
Ketones, ur: NEGATIVE mg/dL
LEUKOCYTES UA: NEGATIVE
NITRITE: NEGATIVE
PH: 7 (ref 5.0–8.0)
Protein, ur: NEGATIVE mg/dL
SPECIFIC GRAVITY, URINE: 1.016 (ref 1.005–1.030)

## 2016-12-30 LAB — COMPREHENSIVE METABOLIC PANEL
ALT: 14 U/L (ref 14–54)
AST: 18 U/L (ref 15–41)
Albumin: 4.2 g/dL (ref 3.5–5.0)
Alkaline Phosphatase: 81 U/L (ref 38–126)
Anion gap: 11 (ref 5–15)
BUN: 12 mg/dL (ref 6–20)
CHLORIDE: 105 mmol/L (ref 101–111)
CO2: 24 mmol/L (ref 22–32)
CREATININE: 0.84 mg/dL (ref 0.44–1.00)
Calcium: 9.1 mg/dL (ref 8.9–10.3)
GFR calc non Af Amer: >60 mL/min (ref >60)
Glucose, Bld: 144 mg/dL — ABNORMAL HIGH (ref 65–99)
POTASSIUM: 3.6 mmol/L (ref 3.5–5.1)
SODIUM: 140 mmol/L (ref 135–145)
TOTAL PROTEIN: 7.7 g/dL (ref 6.5–8.1)
Total Bilirubin: 0.4 mg/dL (ref 0.3–1.2)

## 2016-12-30 LAB — CBC WITH DIFFERENTIAL/PLATELET
BASOS ABS: 0 10*3/uL (ref 0.0–0.1)
BASOS PCT: 0 %
EOS PCT: 0 %
Eosinophils Absolute: 0 10*3/uL (ref 0.0–0.7)
HCT: 35.1 % — ABNORMAL LOW (ref 36.0–46.0)
Hemoglobin: 11.4 g/dL — ABNORMAL LOW (ref 12.0–15.0)
Lymphocytes Relative: 13 %
Lymphs Abs: 0.9 10*3/uL (ref 0.7–4.0)
MCH: 23.6 pg — ABNORMAL LOW (ref 26.0–34.0)
MCHC: 32.5 g/dL (ref 30.0–36.0)
MCV: 72.7 fL — ABNORMAL LOW (ref 78.0–100.0)
MONO ABS: 0.3 10*3/uL (ref 0.1–1.0)
Monocytes Relative: 4 %
Neutro Abs: 5.6 10*3/uL (ref 1.7–7.7)
Neutrophils Relative %: 83 %
PLATELETS: 319 10*3/uL (ref 150–400)
RBC: 4.83 MIL/uL (ref 3.87–5.11)
RDW: 16.4 % — AB (ref 11.5–15.5)
WBC: 6.7 10*3/uL (ref 4.0–10.5)

## 2016-12-30 LAB — LIPASE, BLOOD: LIPASE: 27 U/L (ref 11–51)

## 2016-12-30 LAB — TROPONIN I: Troponin I: <0.03 ng/mL (ref <0.03)

## 2016-12-30 MED ORDER — SODIUM CHLORIDE 0.9 % IV BOLUS (SEPSIS)
500.0000 mL | Freq: Once | INTRAVENOUS | Status: AC
  Administered 2016-12-30: 500 mL via INTRAVENOUS

## 2016-12-30 MED ORDER — GI COCKTAIL ~~LOC~~
30.0000 mL | Freq: Once | ORAL | Status: AC
  Administered 2016-12-30: 30 mL via ORAL
  Filled 2016-12-30: qty 30

## 2016-12-30 MED ORDER — IOPAMIDOL (ISOVUE-300) INJECTION 61%
INTRAVENOUS | Status: AC
  Administered 2016-12-30: 100 mL via INTRAVENOUS
  Filled 2016-12-30: qty 100

## 2016-12-30 MED ORDER — ONDANSETRON 4 MG PO TBDP: 4.0000 mg | ORAL_TABLET | Freq: Three times a day (TID) | ORAL | 0 refills | Status: DC | PRN

## 2016-12-30 MED ORDER — OXYCODONE-ACETAMINOPHEN 5-325 MG PO TABS: 1.0000 | ORAL_TABLET | ORAL | 0 refills | Status: DC | PRN

## 2016-12-30 MED ORDER — MORPHINE SULFATE (PF) 4 MG/ML IV SOLN
4.0000 mg | Freq: Once | INTRAVENOUS | Status: AC
  Administered 2016-12-30: 4 mg via INTRAVENOUS
  Filled 2016-12-30: qty 1

## 2016-12-30 MED ORDER — OXYCODONE-ACETAMINOPHEN 5-325 MG PO TABS
1.0000 | ORAL_TABLET | Freq: Once | ORAL | Status: AC
  Administered 2016-12-30: 1 via ORAL
  Filled 2016-12-30: qty 1

## 2016-12-30 NOTE — Discharge Instructions (Signed)
Biliary Colic, Adult Biliary colic is severe pain caused by a problem with a small organ in the upper right part of your belly (gallbladder). The gallbladder stores a digestive fluid produced in the liver (bile) that helps the body break down fat. Bile and other digestive enzymes are carried from the liver to the small intestine though tube-like structures (bile ducts). The gallbladder and the bile ducts form the biliary tract. Sometimes hard deposits of digestive fluids form in the gallbladder (gallstones) and block the flow of bile from the gallbladder, causing biliary colic. This condition is also called a gallbladder attack. Gallstones can be as small as a grain of sand or as big as a golf ball. There could be just one gallstone in the gallbladder, or there could be many. What are the causes? Biliary colic is usually caused by gallstones. Less often, a tumor could block the flow of bile from the gallbladder and trigger biliary colic. What increases the risk? This condition is more likely to develop in:  Women.  People of Hispanic descent.  People with a family history of gallstones.  People who are obese.  People who suddenly or quickly lose weight.  People who eat a high-calorie, low-fiber diet that is rich in refined carbs (carbohydrates), such as white bread and white rice.  People who have an intestinal disease that affects nutrient absorption, such as Crohn disease.  People who have a metabolic condition, such as metabolic syndrome or diabetes.  What are the signs or symptoms? Severe pain in the upper right side of the belly is the main symptom of biliary colic. You may feel this pain below the chest but above the hip. This pain often occurs at night or after eating a very fatty meal. This pain may get worse for up to an hour and last as long as 12 hours. In most cases, the pain fades (subsides) within a couple hours. Other symptoms of this condition include:  Nausea and  vomiting.  Pain under the right shoulder.  How is this diagnosed? This condition is diagnosed based on your medical history, your symptoms, and a physical exam. You may have tests, including:  Blood tests to rule out infection or inflammation of the bile ducts, gallbladder, pancreas, or liver.  Imaging studies such as: ? Ultrasound. ? CT scan. ? MRI.  In some cases, you may need to have an imaging study done using a small amount of radioactive material (nuclear medicine) to confirm the diagnosis. How is this treated? Treatment for this condition may include medicine to relieve your pain or nausea. If you have gallstones that are causing biliary colic, you may need surgery to remove the gallbladder (cholecystectomy). Gallstones can also be dissolved gradually with medicine. It may take months or years before the gallstones are completely gone. Follow these instructions at home:  Take over-the-counter and prescription medicines only as told by your health care provider.  Drink enough fluid to keep your urine clear or pale yellow.  Follow instructions from your health care provider about eating or drinking restrictions. These may include avoiding: ? Fatty, greasy, and fried foods. ? Any foods that make the pain worse. ? Overeating. ? Having a large meal after not eating for a while.  Keep all follow-up visits as told by your health care provider. This is important. How is this prevented? Steps to prevent this condition include:  Maintaining a healthy body weight.  Getting regular exercise.  Eating a healthy, high-fiber, low-fat diet.  Limiting how much sugar and refined carbs you eat, such as sweets, white flour, and white rice.  Contact a health care provider if:  Your pain lasts more than 5 hours.  You vomit.  You have a fever and chills.  Your pain gets worse. Get help right away if:  Your skin or the whites of your eyes look yellow (jaundice).  Your have  tea-colored urine and light-colored stools.  You are dizzy or you faint. This information is not intended to replace advice given to you by your health care provider. Make sure you discuss any questions you have with your health care provider. Document Released: 07/21/2005 Document Revised: 10/16/2015 Document Reviewed: 09/03/2015 Elsevier Interactive Patient Education  2018 [employer] Inc[employer]   Low-Fat Diet for Pancreatitis or Gallbladder Conditions A low-fat diet can be helpful if you have pancreatitis or a gallbladder condition. With these conditions, your pancreas and gallbladder have trouble digesting fats. A healthy eating plan with less fat will help rest your pancreas and gallbladder and reduce your symptoms. What do I need to know about this diet?  Eat a low-fat diet. ? Reduce your fat intake to less than 20-30% of your total daily calories. This is less than 50-60 g of fat per day. ? Remember that you need some fat in your diet. Ask your dietician what your daily goal should be. ? Choose nonfat and low-fat healthy foods. Look for the words nonfat, low fat, or fat free. ? As a guide, look on the label and choose foods with less than 3 g of fat per serving. Eat only one serving.  Avoid alcohol.  Do not smoke. If you need help quitting, talk with your health care provider.  Eat small frequent meals instead of three large heavy meals. What foods can I eat? Grains Include healthy grains and starches such as potatoes, wheat bread, fiber-rich cereal, and brown rice. Choose whole grain options whenever possible. In adults, whole grains should account for 45-65% of your daily calories. Fruits and Vegetables Eat plenty of fruits and vegetables. Fresh fruits and vegetables add fiber to your diet. Meats and Other Protein Sources Eat lean meat such as chicken and pork. Trim any fat off of meat before cooking it. Eggs, fish, and beans are other sources of protein. In adults, these foods  should account for 10-35% of your daily calories. Dairy Choose low-fat milk and dairy options. Dairy includes fat and protein, as well as calcium. Fats and Oils Limit high-fat foods such as fried foods, sweets, baked goods, sugary drinks. Other Creamy sauces and condiments, such as mayonnaise, can add extra fat. Think about whether or not you need to use them, or use smaller amounts or low fat options. What foods are not recommended?  High fat foods, such as: ? [employer]aked goods. ? Ice cream. ? Jamaica[place] toast. ? Sweet rolls. ? Pizza. ? Cheese bread. ? Foods covered with batter, butter, creamy sauces, or cheese. ? Fried foods. ? Sugary drinks and desserts.  Foods that cause gas or bloating This information is not intended to replace advice given to you by your health care provider. Make sure you discuss any questions you have with your health care provider. Document Released: 02/22/2013 Document Revised: 07/26/2015 Document Reviewed: 01/31/2013 Elsevier Interactive Patient Education  2017 [employer]

## 2016-12-30 NOTE — ED Notes (Signed)
Ultrasound at bedside

## 2016-12-30 NOTE — Consult Note (Signed)
[facility] Consult/Admission Note  Melinda Ingram [birth year]  [id number].    Requesting MD: Dr. Laverta Baltimore Chief Complaint/Reason for Consult: Symptomatic cholelithiasis  HPI:   Pt is a 41 year old female with a history of DM on metformin, HTN and obesity who presented to the [facility] ED with complaints of RUQ pain. Pt states she has been having this pain intermittently for one year. She is aware she has gallstones and has been told by her PCP to eat a low fat diet. She had pain roughly one month ago but resolved without need to come to the hospital. Pain started around 1:30 this morning in the RUQ that radiates into her back, burning sensation in her epigastric region, nausea and one episode of vomiting without blood. Pain was severe and constant. No fever or chills. No diarrhea, dysuria, blood in her vomit. Pt is currently without pain.   ROS:  Review of Systems  Constitutional: Negative for chills and fever.  Respiratory: Negative for shortness of breath.   Cardiovascular: Negative for chest pain.  Gastrointestinal: Positive for abdominal pain, nausea and vomiting. Negative for blood in stool, constipation, diarrhea and melena.  Genitourinary: Negative for dysuria and hematuria.  Skin: Negative for rash.  Neurological: Negative for dizziness, loss of consciousness and weakness.  All other systems reviewed and are negative.    Family History  Problem Relation Age of Onset  . Hypertension Mother   . Diabetes Mother   . Heart failure Mother   . Hypertension Father     Past Medical History:  Diagnosis Date  . Diabetes mellitus without complication ([place])   . Gallstone   . GERD (gastroesophageal reflux disease)   . Hypertension    Stopped Lisinopril because of side effects.    Past Surgical History:  Procedure Laterality Date  . TUBAL LIGATION      Social History:  reports that she has never smoked. She has never used smokeless tobacco. She reports that she drinks alcohol.  She reports that she does not use drugs.  Allergies:  Allergies  Allergen Reactions  . Lisinopril Cough     (Not in a hospital admission)  Blood pressure (!) 144/98, pulse 81, temperature 98.4 F (36.9 C), temperature source Oral, resp. rate 15, weight 250 lb (113.4 kg), last menstrual period 12/28/2016, SpO2 100 %.  Physical Exam  Constitutional: She is oriented to person, place, and time and well-developed, well-nourished, and in no distress. Vital signs are normal. No distress.  HENT:  Head: Normocephalic and atraumatic.  Nose: Nose normal.  Mouth/Throat: Oropharynx is clear and moist. No oropharyngeal exudate.  Eyes: Pupils are equal, round, and reactive to light. Conjunctivae are normal. Right eye exhibits no discharge. Left eye exhibits no discharge. No scleral icterus.  Neck: Normal range of motion. Neck supple. No tracheal deviation present. No thyromegaly present.  Cardiovascular: Normal rate, regular rhythm, normal heart sounds and intact distal pulses.  Exam reveals no gallop and no friction rub.   No murmur heard. Pulses:      Radial pulses are 2+ on the right side, and 2+ on the left side.       Dorsalis pedis pulses are 2+ on the right side, and 2+ on the left side.  Pulmonary/Chest: Effort normal and breath sounds normal. No respiratory distress. She has no decreased breath sounds. She has no wheezes. She has no rhonchi. She has no rales.  Abdominal: Soft. Normal appearance and bowel sounds are normal. She exhibits no distension and no  mass. There is no hepatosplenomegaly. There is no tenderness. There is no rebound and no guarding.  Musculoskeletal: Normal range of motion. She exhibits no edema or deformity.  Lymphadenopathy:    She has no cervical adenopathy.  Neurological: She is alert and oriented to person, place, and time.  Skin: Skin is warm and dry. No rash noted. She is not diaphoretic.  Psychiatric: Mood and affect normal.  Nursing note and vitals  reviewed.   Results for orders placed or performed during the hospital encounter of 12/30/16 (from the past 48 hour(s))  Comprehensive metabolic panel     Status: Abnormal   Collection Time: 12/30/16  8:03 AM  Result Value Ref Range   Sodium 140 135 - 145 mmol/L   Potassium 3.6 3.5 - 5.1 mmol/L   Chloride 105 101 - 111 mmol/L   CO2 24 22 - 32 mmol/L   Glucose, Bld 144 (H) 65 - 99 mg/dL   BUN 12 6 - 20 mg/dL   Creatinine, Ser 0.84 0.44 - 1.00 mg/dL   Calcium 9.1 8.9 - 10.3 mg/dL   Total Protein 7.7 6.5 - 8.1 g/dL   Albumin 4.2 3.5 - 5.0 g/dL   AST 18 15 - 41 U/L   ALT 14 14 - 54 U/L   Alkaline Phosphatase 81 38 - 126 U/L   Total Bilirubin 0.4 0.3 - 1.2 mg/dL   GFR calc non Af Amer >60 >60 mL/min   GFR calc Af Amer >60 >60 mL/min    Comment: (NOTE) The eGFR has been calculated using the CKD EPI equation. This calculation has not been validated in all clinical situations. eGFR's persistently <60 mL/min signify possible Chronic Kidney Disease.    Anion gap 11 5 - 15  Lipase, blood     Status: None   Collection Time: 12/30/16  8:03 AM  Result Value Ref Range   Lipase 27 11 - 51 U/L  CBC with Differential     Status: Abnormal   Collection Time: 12/30/16  8:03 AM  Result Value Ref Range   WBC 6.7 4.0 - 10.5 K/uL   RBC 4.83 3.87 - 5.11 MIL/uL   Hemoglobin 11.4 (L) 12.0 - 15.0 g/dL   HCT 35.1 (L) 36.0 - 46.0 %   MCV 72.7 (L) 78.0 - 100.0 fL   MCH 23.6 (L) 26.0 - 34.0 pg   MCHC 32.5 30.0 - 36.0 g/dL   RDW 16.4 (H) 11.5 - 15.5 %   Platelets 319 150 - 400 K/uL   Neutrophils Relative % 83 %   Neutro Abs 5.6 1.7 - 7.7 K/uL   Lymphocytes Relative 13 %   Lymphs Abs 0.9 0.7 - 4.0 K/uL   Monocytes Relative 4 %   Monocytes Absolute 0.3 0.1 - 1.0 K/uL   Eosinophils Relative 0 %   Eosinophils Absolute 0.0 0.0 - 0.7 K/uL   Basophils Relative 0 %   Basophils Absolute 0.0 0.0 - 0.1 K/uL  Urinalysis, Routine w reflex microscopic     Status: Abnormal   Collection Time: 12/30/16  8:03  AM  Result Value Ref Range   Color, Urine YELLOW YELLOW   APPearance CLEAR CLEAR   Specific Gravity, Urine 1.016 1.005 - 1.030   pH 7.0 5.0 - 8.0   Glucose, UA NEGATIVE NEGATIVE mg/dL   Hgb urine dipstick LARGE (A) NEGATIVE   Bilirubin Urine NEGATIVE NEGATIVE   Ketones, ur NEGATIVE NEGATIVE mg/dL   Protein, ur NEGATIVE NEGATIVE mg/dL   Nitrite NEGATIVE NEGATIVE  Leukocytes, UA NEGATIVE NEGATIVE   RBC / HPF 0-5 0 - 5 RBC/hpf   WBC, UA 0-5 0 - 5 WBC/hpf   Bacteria, UA RARE (A) NONE SEEN   Squamous Epithelial / LPF 6-30 (A) NONE SEEN   Mucus PRESENT   Troponin I     Status: None   Collection Time: 12/30/16  8:03 AM  Result Value Ref Range   Troponin I <0.03 <0.03 ng/mL   Ct Abdomen Pelvis W Contrast  Result Date: 12/30/2016 CLINICAL DATA:  Abdominal pain and nausea EXAM: CT ABDOMEN AND PELVIS WITH CONTRAST TECHNIQUE: Multidetector CT imaging of the abdomen and pelvis was performed using the standard protocol following bolus administration of intravenous contrast. CONTRAST:  156m ISOVUE-300 IOPAMIDOL (ISOVUE-300) INJECTION 61% COMPARISON:  None. FINDINGS: Lower chest: There is mild scarring in the lung bases. No lung base edema or consolidation. Hepatobiliary: No focal liver lesions are appreciable. Gallbladder wall appears thickened. There is no appreciable biliary duct dilatation. Pancreas: There is no evident pancreatic mass or inflammatory focus. Spleen: No splenic lesions are evident. Adrenals/Urinary Tract: Adrenals appear normal bilaterally. Kidneys bilaterally show no evident mass or hydronephrosis on either side. There is a 2 mm calculus in the mid right kidney. No ureteral calculus evident on either side. Urinary bladder is midline with wall thickness within normal limits. Stomach/Bowel: There is no appreciable bowel wall or mesenteric thickening. No evident bowel obstruction. No free air or portal venous air. There is moderate stool throughout the colon. Vascular/Lymphatic: There  is no abdominal aortic aneurysm. There are no evident vascular lesions. There is no adenopathy in the abdomen or pelvis. Reproductive: Uterus is anteverted. The uterus is inhomogeneous and somewhat lobulated in appearance consistent with leiomyomatous change. Largest individual leiomyoma arises anteriorly from the uterus measuring approximately 4.5 x 4 cm. No pelvic masses are evident apart from the uterus. Other: Appendix appears normal. There is a ventral hernia containing fat but no bowel. No abscess or ascites is evident in the abdomen or pelvis. Musculoskeletal: There are no blastic or lytic bone lesions. There is degenerative change in the lumbar spine. There are no blastic or lytic bone lesions. IMPRESSION: 1. Gallbladder wall appears thickened and somewhat ill-defined. This appearance is concerning for potential cholecystitis. Correlation with ultrasound of the gallbladder is advised. 2. No evident bowel obstruction or bowel wall thickening. No abscess. Appendix appears normal. 3.  Leiomyomatous change in the uterus. 4.  Ventral hernia containing fat but no bowel. 5. 2 mm calculus mid right kidney. No hydronephrosis or ureteral calculus on either side. Electronically Signed   By: WLowella GripIII M.D.   On: 12/30/2016 14:03   [place] Limited Ruq  Result Date: 12/30/2016 CLINICAL DATA:  Right upper quadrant pain EXAM: ULTRASOUND ABDOMEN LIMITED RIGHT UPPER QUADRANT COMPARISON:  06/12/2016 FINDINGS: Gallbladder: Multiple gallstones are noted. No wall thickening or pericholecystic fluid is seen. Common bile duct: Diameter: 2.4 mm. Liver: No focal lesion identified. Within normal limits in parenchymal echogenicity. Portal vein is patent on color Doppler imaging with normal direction of blood flow towards the liver. IMPRESSION: Cholelithiasis without complicating factors. Electronically Signed   By: MInez CatalinaM.D.   On: 12/30/2016 08:59      Assessment/Plan  Symptomatic cholelithiasis - pt  was given the option to come into the hospital to have her gallbladder removed tomorrow. She would prefer to follow up this week at our office to schedule elective outpt surgery. She was afebrile, WBC and LFT's normal and pain had  resolved. No signs of cholecystitis on [place] or exam.   Pt was given follow up information for our office and low fat diet information in her discharge paperwork. I did discuss with the pt the possibility of getting cholecystitis, choledocholithiasis and gallstone pancreatitis since she has gallstones. I also informed her that another episode of biliary colic is likely. She still elected to leave today and states she will follow up in our office this week.   Kalman Drape, [facility], [place] 12/30/2016, 4:04 PM Pager: [phone] Consults: [phone] Mon-Fri 7:00 am-4:30 pm Sat-Sun 7:00 am-11:30 am

## 2016-12-30 NOTE — ED Triage Notes (Signed)
Pt reports upper abd pain since 0130 this am accompanied by nausea. Hx of gallstones, feels like same.

## 2016-12-30 NOTE — ED Notes (Signed)
Patient is on menstrual cycle

## 2016-12-30 NOTE — ED Notes (Signed)
Patient transported to CT 

## 2016-12-30 NOTE — ED Provider Notes (Signed)
Emergency Department Provider Note   I have reviewed the triage vital signs and the nursing notes.   HISTORY  Chief Complaint Abdominal Pain   HPI Melinda Ingram is a 41 y.o. female with PMH of DM, GERD, HTN presents to the emergency department for evaluation of upper gastric abdominal pain with associated nausea. Patient states she has a history of "gallbladder attacks" and at this feels similar to her prior episodes. Pain radiates slightly to the chest which she states is also typical of her gallbladder discomfort. She is followed by her PCP Dr. Hart Rochester but has not seen a general surgeon regarding this issue.  She denies any fevers or chills.  No blood in the vomit.  No associated diarrhea.  No modifying factors. Symptoms began at approximately 1:30 AM.   Past Medical History:  Diagnosis Date  . Diabetes mellitus without complication ([facility])   . Gallstone   . GERD (gastroesophageal reflux disease)   . Hypertension    Stopped Lisinopril because of side effects.    Patient Active Problem List   Diagnosis Date Noted  . Pain of joint of left ankle and foot 05/28/2016  . Elevated blood pressure reading 05/28/2016  . Foot erythema 05/28/2016  . Essential hypertension 05/28/2016    Past Surgical History:  Procedure Laterality Date  . TUBAL LIGATION      Current Outpatient Rx  . Order #: [id number] Class: Normal  . Order #: [id number] Class: Historical Med  . Order #: [id number] Class: Normal  . Order #: [id number] Class: Normal  . Order #: [id number] Class: Print  . Order #: [id number] Class: Print    Allergies Lisinopril  Family History  Problem Relation Age of Onset  . Hypertension Mother   . Diabetes Mother   . Heart failure Mother   . Hypertension Father     Social History Social History  Substance Use Topics  . Smoking status: Never Smoker  . Smokeless tobacco: Never Used  . Alcohol use Yes     Comment: occausional    Review of Systems  Constitutional: No  fever/chills Eyes: No visual changes. ENT: No sore throat. Cardiovascular: Denies chest pain. Respiratory: Denies shortness of breath. Gastrointestinal: Positive RUQ abdominal pain. Positive nausea and vomiting.  No diarrhea.  No constipation. Genitourinary: Negative for dysuria. Musculoskeletal: Negative for back pain. Skin: Negative for rash. Neurological: Negative for headaches, focal weakness or numbness.  10-point ROS otherwise negative.  ____________________________________________   PHYSICAL EXAM:  VITAL SIGNS: ED Triage Vitals  Enc Vitals Group     BP 12/30/16 0735 (!) 154/96     Pulse Rate 12/30/16 0735 76     Resp 12/30/16 0735 16     Temp 12/30/16 0735 98.4 F (36.9 C)     Temp Source 12/30/16 0735 Oral     SpO2 12/30/16 0735 94 %     Weight 12/30/16 0736 250 lb (113.4 kg)     Pain Score 12/30/16 0735 10   Constitutional: Alert and oriented. Well appearing and in no acute distress. Eyes: Conjunctivae are normal.  Head: Atraumatic. Nose: No congestion/rhinnorhea. Mouth/Throat: Mucous membranes are moist.  Oropharynx non-erythematous. Neck: No stridor.   Cardiovascular: Normal rate, regular rhythm. Good peripheral circulation. Grossly normal heart sounds.   Respiratory: Normal respiratory effort.  No retractions. Lungs CTAB. Gastrointestinal: Soft with epigastric and RUQ abdominal pain.  No rebound or guarding. No distention.  Musculoskeletal: No lower extremity tenderness nor edema. No gross deformities of extremities. Neurologic:  Normal speech and language. No gross focal neurologic  deficits are appreciated.  Skin:  Skin is warm, dry and intact. No rash noted.  ____________________________________________   LABS (all labs ordered are listed, but only abnormal results are displayed)  Labs Reviewed  COMPREHENSIVE METABOLIC PANEL - Abnormal; Notable for the following:       Result Value   Glucose, Bld 144 (*)    All other components within normal limits    CBC WITH DIFFERENTIAL/PLATELET - Abnormal; Notable for the following:    Hemoglobin 11.4 (*)    HCT 35.1 (*)    MCV 72.7 (*)    MCH 23.6 (*)    RDW 16.4 (*)    All other components within normal limits  URINALYSIS, ROUTINE W REFLEX MICROSCOPIC - Abnormal; Notable for the following:    Hgb urine dipstick LARGE (*)    Bacteria, UA RARE (*)    Squamous Epithelial / LPF 6-30 (*)    All other components within normal limits  LIPASE, BLOOD  TROPONIN I   ____________________________________________  EKG   EKG Interpretation  Date/Time:  Tuesday December 30 2016 07:44:50 EDT Ventricular Rate:  69 PR Interval:    QRS Duration: 92 QT Interval:  434 QTC Calculation: 465 R Axis:   51 Text Interpretation:  Sinus rhythm No STEMI.  Confirmed by Alona Bene [phone]) on 12/30/2016 7:51:27 AM       ____________________________________________  RADIOLOGY  Ct Abdomen Pelvis W Contrast  Result Date: 12/30/2016 CLINICAL DATA:  Abdominal pain and nausea EXAM: CT ABDOMEN AND PELVIS WITH CONTRAST TECHNIQUE: Multidetector CT imaging of the abdomen and pelvis was performed using the standard protocol following bolus administration of intravenous contrast. CONTRAST:  ISOVUE-300 IOPAMIDOL (ISOVUE-300) INJECTION 61% COMPARISON:  None. FINDINGS: Lower chest: There is mild scarring in the lung bases. No lung base edema or consolidation. Hepatobiliary: No focal liver lesions are appreciable. Gallbladder wall appears thickened. There is no appreciable biliary duct dilatation. Pancreas: There is no evident pancreatic mass or inflammatory focus. Spleen: No splenic lesions are evident. Adrenals/Urinary Tract: Adrenals appear normal bilaterally. Kidneys bilaterally show no evident mass or hydronephrosis on either side. There is a 2 mm calculus in the mid right kidney. No ureteral calculus evident on either side. Urinary bladder is midline with wall thickness within normal limits. Stomach/Bowel: There is no  appreciable bowel wall or mesenteric thickening. No evident bowel obstruction. No free air or portal venous air. There is moderate stool throughout the colon. Vascular/Lymphatic: There is no abdominal aortic aneurysm. There are no evident vascular lesions. There is no adenopathy in the abdomen or pelvis. Reproductive: Uterus is anteverted. The uterus is inhomogeneous and somewhat lobulated in appearance consistent with leiomyomatous change. Largest individual leiomyoma arises anteriorly from the uterus measuring approximately 4.5 x 4 cm. No pelvic masses are evident apart from the uterus. Other: Appendix appears normal. There is a ventral hernia containing fat but no bowel. No abscess or ascites is evident in the abdomen or pelvis. Musculoskeletal: There are no blastic or lytic bone lesions. There is degenerative change in the lumbar spine. There are no blastic or lytic bone lesions. IMPRESSION: 1. Gallbladder wall appears thickened and somewhat ill-defined. This appearance is concerning for potential cholecystitis. Correlation with ultrasound of the gallbladder is advised. 2. No evident bowel obstruction or bowel wall thickening. No abscess. Appendix appears normal. 3.  Leiomyomatous change in the uterus. 4.  Ventral hernia containing fat but no bowel. 5. 2 mm calculus mid right kidney. No hydronephrosis or ureteral calculus on either side. Electronically  Signed   By: Bretta BangWilliam  Woodruff III M.D.   On: 12/30/2016 14:03   [place] Abdomen Limited Ruq  Result Date: 12/30/2016 CLINICAL DATA:  Right upper quadrant pain EXAM: ULTRASOUND ABDOMEN LIMITED RIGHT UPPER QUADRANT COMPARISON:  06/12/2016 FINDINGS: Gallbladder: Multiple gallstones are noted. No wall thickening or pericholecystic fluid is seen. Common bile duct: Diameter: 2.4 mm. Liver: No focal lesion identified. Within normal limits in parenchymal echogenicity. Portal vein is patent on color Doppler imaging with normal direction of blood flow towards the liver.  IMPRESSION: Cholelithiasis without complicating factors. Electronically Signed   By: Alcide CleverMark  Lukens M.D.   On: 12/30/2016 08:59    ____________________________________________   PROCEDURES  Procedure(s) performed:   Procedures  None ____________________________________________   INITIAL IMPRESSION / ASSESSMENT AND PLAN / ED COURSE  Pertinent labs & imaging results that were available during my care of the patient were reviewed by me and considered in my medical decision making (see chart for details).  Patient presents to the ED with RUQ abdominal pain with nausea similar to prior gallbladder "flares." She is followed by her PCP for this but has not seen a general surgeon. Plan for RUQ US, labs, and reassess. Patient with slight radiation of pain to the chest that is typical of these events for her.   11:09 AM Patient with no significant relief with PO pain medication. No leukocytosis, elevated t. Bili, or other significant lab abnormalities. Some hematuria but patient is on her period. Plan for CT to evaluate for alternate abdominal pain etiology.   02:40 PM Patient feeling slightly better.  CT shows some early inflammation of the gallbladder wall which in this clinical context is concerning for early cholecystitis.  Patient at the very least seems to have symptomatic cholelithiasis.  Plan to discuss the case with general surgery regarding treatment.  Patient was seen by general surgery and offered admission for lap chole but she would prefer to return home and f/u with general surgery as an outpatient.   ____________________________________________  FINAL CLINICAL IMPRESSION(S) / ED DIAGNOSES  Final diagnoses:  RUQ pain  Symptomatic cholelithiasis     MEDICATIONS GIVEN DURING THIS VISIT:  Medications  sodium chloride 0.9 % bolus 500 mL (0 mLs Intravenous Stopped 12/30/16 0900)  morphine 4 MG/ML injection 4 mg (4 mg Intravenous Given 12/30/16 0819)    oxyCODONE-acetaminophen (PERCOCET/ROXICET) 5-325 MG per tablet 1 tablet (1 tablet Oral Given 12/30/16 1025)  gi cocktail (Maalox,Lidocaine,Donnatal) (30 mLs Oral Given 12/30/16 1129)  morphine 4 MG/ML injection 4 mg (4 mg Intravenous Given 12/30/16 1129)  iopamidol (ISOVUE-300) 61 % injection (100 mLs Intravenous Contrast Given 12/30/16 1339)     NEW OUTPATIENT MEDICATIONS STARTED DURING THIS VISIT:  Discharge Medication List as of 12/30/2016  4:12 PM    START taking these medications   Details  ondansetron (ZOFRAN ODT) 4 MG disintegrating tablet Take 1 tablet (4 mg total) by mouth every 8 (eight) hours as needed for nausea or vomiting., Starting Tue 12/30/2016, Print    oxyCODONE-acetaminophen (PERCOCET/ROXICET) 5-325 MG tablet Take 1 tablet by mouth every 4 (four) hours as needed for severe pain., Starting Tue 12/30/2016, Print        Note:  This document was prepared using Dragon voice recognition software and may include unintentional dictation errors.  Alona BeneJoshua Addam Goeller, MD Emergency Medicine    Caysie Minnifield, Arlyss RepressJoshua G, MD 12/30/16 [phone]

## 2017-01-08 ENCOUNTER — Ambulatory Visit: Payer: Self-pay | Admitting: Surgery

## 2017-01-08 NOTE — H&P (Signed)
Melinda Ingram 01/08/2017 11:35 AM Location: [facility] Patient #: [id number] DOB: 08/07/1975 Divorced / Language: [place] / Race: Black or African American Female  History of Present Illness (Davyon Fisch A. Fredricka Bonineonnor MD; 01/08/2017 11:53 AM) Patient words: 41yo woman with diabetes, obesity, GERD and hypertension referred by ER for symptomatic cholelithiasis. She has been having symptoms for about a year and she has had about 8 attacks. The attacks last for a few hours and consist of epigastric and right upper quadrant pain associated with nausea and vomiting but no fevers. She was seen by us in the emergency department on October 30. We were consulted but she left the emergency department before the surgeon on-call could staff to PAs consult. She had been advised that given her multiple attacks that she could be admitted and have her gallbladder removed the following day that she did not agree to this plan of action. PA's note: "Pt is a 41 year old female with a history of DM on metformin, HTN and obesity who presented to the [facility] ED with complaints of RUQ pain. Pt states she has been having this pain intermittently for one year. She is aware she has gallstones and has been told by her PCP to eat a low fat diet. She had pain roughly one month ago but resolved without need to come to the hospital. Pain started around 1:30 this morning in the RUQ that radiates into her back, burning sensation in her epigastric region, nausea and one episode of vomiting without blood. Pain was severe and constant. No fever or chills. No diarrhea, dysuria, blood in her vomit. Pt is currently without pain. " Lab work on the 30th including CBC and CMP were unremarkable.  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 PSH tubal ligation.  US 10/30: ULTRASOUND ABDOMEN LIMITED RIGHT UPPER QUADRANT  COMPARISON: 06/12/2016  FINDINGS: Gallbladder:  Multiple gallstones are noted.  No wall thickening or pericholecystic fluid is seen.  Common bile duct:  Diameter: 2.4 mm.  Liver:  No focal lesion identified. Within normal limits in parenchymal echogenicity. Portal vein is patent on color Doppler imaging with normal direction of blood flow towards the liver.  IMPRESSION: Cholelithiasis without complicating factors.  CT 10/30 FINDINGS: Lower chest: There is mild scarring in the lung bases. No lung base edema or consolidation.  Hepatobiliary: No focal liver lesions are appreciable. Gallbladder wall appears thickened. There is no appreciable biliary duct dilatation.  Pancreas: There is no evident pancreatic mass or inflammatory focus.  Spleen: No splenic lesions are evident.  Adrenals/Urinary Tract: Adrenals appear normal bilaterally. Kidneys bilaterally show no evident mass or hydronephrosis on either side. There is a 2 mm calculus in the mid right kidney. No ureteral calculus evident on either side. Urinary bladder is midline with wall thickness within normal limits.  Stomach/Bowel: There is no appreciable bowel wall or mesenteric thickening. No evident bowel obstruction. No free air or portal venous air. There is moderate stool throughout the colon.  Vascular/Lymphatic: There is no abdominal aortic aneurysm. There are no evident vascular lesions. There is no adenopathy in the abdomen or pelvis.  Reproductive: Uterus is anteverted. The uterus is inhomogeneous and somewhat lobulated in appearance consistent with leiomyomatous change. Largest individual leiomyoma arises anteriorly from the uterus measuring approximately 4.5 x 4 cm. No pelvic masses are evident apart from the uterus.  Other: Appendix appears normal. There is a ventral hernia containing fat but no bowel.  No abscess or ascites is evident in the abdomen or pelvis.  Musculoskeletal: There are no blastic or lytic bone lesions. There is degenerative change in the lumbar spine. There  are no blastic or lytic bone lesions.  IMPRESSION: 1. Gallbladder wall appears thickened and somewhat ill-defined. This appearance is concerning for potential cholecystitis. Correlation with ultrasound of the gallbladder is advised.  2. No evident bowel obstruction or bowel wall thickening. No abscess. Appendix appears normal.  3. Leiomyomatous change in the uterus.  4. Ventral hernia containing fat but no bowel.  5. 2 mm calculus mid right kidney. No hydronephrosis or ureteral calculus on either side.  The patient is a 41 year old female.   Diagnostic Studies History Judithann Sauger(Patricia King, [place]; 01/08/2017 11:36 AM) Mammogram never Pap Smear 1-5 years ago  Allergies Judithann Sauger(Patricia King, [place]; 01/08/2017 11:38 AM) Lisinopril *ANTIHYPERTENSIVES* Allergies Reconciled  Medication History Judithann Sauger(Patricia King, RMA; 01/08/2017 11:38 AM) MetFORMIN HCl (500MG  Tablet, Oral) Active. AmLODIPine Besylate (5MG  Tablet, Oral) Active. Ondansetron (4MG  Tablet Disint, as needed Oral) Active. Mobic (7.5MG  Tablet, Oral) Active. Medications Reconciled  Social History Judithann Sauger(Patricia King, [place]; 01/08/2017 11:36 AM) Alcohol use Occasional alcohol use. Caffeine use Carbonated beverages, Tea. Illicit drug use Remotely quit drug use. Tobacco use Never smoker.  Family History Judithann Sauger(Patricia King, [place]; 01/08/2017 11:36 AM) Alcohol Abuse Family Members In General, Father, Mother. Arthritis Family Members In General, Mother. Breast Cancer Family Members In General. Cerebrovascular Accident Family Members In General. Depression Daughter, Family Members In General, Mother. Diabetes Mellitus Family Members In General, Mother. Heart Disease Family Members In General, Mother. Heart disease in female family member before age 43 Hypertension Family Members In General, Father, Mother. Ischemic Bowel Disease Mother. Migraine Headache Family Members In General. Seizure disorder Family Members In  General.  Pregnancy / Birth History Judithann Sauger(Patricia King, [place]; 01/08/2017 11:36 AM) Age at menarche 10 years. Age of menopause <43 Contraceptive History Depo-provera, Oral contraceptives. Gravida 4 Irregular periods Length (months) of breastfeeding 3-6 Maternal age <15 Para 2  Other Problems Judithann Sauger(Patricia King, [place]; 01/08/2017 11:36 AM) Anxiety Disorder Back Pain Bladder Problems Chest pain Cholelithiasis Depression Diabetes Mellitus High blood pressure     Review of Systems (Christasia Angeletti A. Fredricka Bonineonnor MD; 01/08/2017 11:51 AM) General Present- Weight Gain. Not Present- Appetite Loss, Chills, Fatigue, Fever, Night Sweats and Weight Loss. Skin Not Present- Change in Wart/Mole, Dryness, Hives, Jaundice, New Lesions, Non-Healing Wounds, Rash and Ulcer. HEENT Not Present- Earache, Hearing Loss, Hoarseness, Nose Bleed, Oral Ulcers, Ringing in the Ears, Seasonal Allergies, Sinus Pain, Sore Throat, Visual Disturbances, Wears glasses/contact lenses and Yellow Eyes. Respiratory Present- Snoring. Not Present- Bloody sputum, Chronic Cough, Difficulty Breathing and Wheezing. Breast Not Present- Breast Mass, Breast Pain, Nipple Discharge and Skin Changes. Cardiovascular Present- Leg Cramps and Shortness of Breath. Not Present- Chest Pain, Difficulty Breathing Lying Down, Palpitations, Rapid Heart Rate and Swelling of Extremities. Gastrointestinal Present- Change in Bowel Habits and Nausea. Not Present- Abdominal Pain, Bloating, Bloody Stool, Chronic diarrhea, Constipation, Difficulty Swallowing, Excessive gas, Gets full quickly at meals, Hemorrhoids, Indigestion, Rectal Pain and Vomiting. Female Genitourinary Present- Frequency and Urgency. Not Present- Nocturia, Painful Urination and Pelvic Pain. Musculoskeletal Present- Back Pain and Joint Stiffness. Not Present- Joint Pain, Muscle Pain, Muscle Weakness and Swelling of Extremities. Neurological Present- Numbness. Not Present- Decreased Memory,  Fainting, Headaches, Seizures, Tingling, Tremor, Trouble walking and Weakness. Endocrine Present- Hair Changes, Heat Intolerance, Hot flashes and New Diabetes. Not Present- Cold Intolerance and Excessive Hunger. Hematology Not Present- Blood Thinners, Easy Bruising, Excessive  bleeding, Gland problems, HIV and Persistent Infections. All other systems negative  Vitals Judithann Sauger RMA; 01/08/2017 11:36 AM) 01/08/2017 11:36 AM Weight: 245.8 lb Height: 62in Body Surface Area: 2.09 m Body Mass Index: 44.96 kg/m  Temp.: 98.41F  Pulse: 116 (Regular)  BP: 160/82 (Sitting, Left Arm, Standard)      Physical Exam (Rayen Palen A. Fredricka Bonine MD; 01/08/2017 11:52 AM)  General Note: alert and well appearing  Integumentary Note: warm and dry  Head and Neck Note: no mass or thyromegaly  Eye Note: No scleral icterus. Extra ocular motions intact.  ENMT Note: Moist mucous membranes, dentition intact  Chest and Lung Exam Note: No scleral icterus. Extra ocular motions intact.  Cardiovascular Note: Regular rate and rhythm, no pedal edema  Abdomen Note: Soft, obese,nondistended. Mildly tender epigastrium. No mass or organomegaly.  Neurologic Note: Grossly intact, normal gait  Neuropsychiatric Note: Normal mood and affect. Appropriate insight.  Musculoskeletal Note: Strength symmetrical throughout, no deformity    Assessment & Plan (Geena Weinhold A. Fredricka Bonine MD; 01/08/2017 11:53 AM)  BILIARY COLIC (K80.50) Story: She's been having symptoms for about a year with about 8 attacks. Her symptoms are classic for biliary colic and she has stones on her ultrasound. We discussed laparoscopic cholecystectomy and the risks of the procedure. She had several insightful questions all of which were answered. We will get her scheduled in the next few weeks.  Current Plans You are being scheduled for surgery- Our schedulers will call you.  You should hear from our office's scheduling  department within 5 working days about the location, date, and time of surgery. We try to make accommodations for patient's preferences in scheduling surgery, but sometimes the OR schedule or the surgeon's schedule prevents [place] from making those accommodations.  If you have not heard from our office [phone] in 5 working days, call the office and ask for your surgeon's nurse.  If you have other questions about your diagnosis, plan, or surgery, call the office and ask for your surgeon's nurse.  Pt Education - Pamphlet Given - Laparoscopic Gallbladder Surgery: discussed with patient and provided information. The anatomy & physiology of hepatobiliary & pancreatic function was discussed. The pathophysiology of gallbladder dysfunction was discussed. Natural history risks without surgery was discussed. I feel the risks of no intervention will lead to serious problems that outweigh the operative risks; therefore, I recommended cholecystectomy to remove the pathology. I explained laparoscopic techniques with possible need for an open approach. Probable cholangiogram to evaluate the bilary tract was explained as well.  Risks such as bleeding, infection, abscess, leak, injury to other organs, need for further treatment, heart attack, death, and other risks were discussed. I noted a good likelihood this will help address the problem. Possibility that this will not correct all abdominal symptoms was explained. Goals of post-operative recovery were discussed as well. We will work to minimize complications. An educational handout further explaining the pathology and treatment options was given as well. Questions were answered. The patient expresses understanding & wishes to proceed with surgery.

## 2017-01-08 NOTE — H&P (View-Only) (Signed)
Melinda Ingram 01/08/2017 11:35 AM Location: [facility] Patient #: [id number] DOB: 08/07/1975 Divorced / Language: [place] / Race: Black or African American Female  History of Present Illness (Melinda Willaims A. Fredricka Bonineonnor MD; 01/08/2017 11:53 AM) Patient words: 41yo woman with diabetes, obesity, GERD and hypertension referred by ER for symptomatic cholelithiasis. She has been having symptoms for about a year and she has had about 8 attacks. The attacks last for a few hours and consist of epigastric and right upper quadrant pain associated with nausea and vomiting but no fevers. She was seen by us in the emergency department on October 30. We were consulted but she left the emergency department before the surgeon on-call could staff to PAs consult. She had been advised that given her multiple attacks that she could be admitted and have her gallbladder removed the following day that she did not agree to this plan of action. PA's note: "Pt is a 41 year old female with a history of DM on metformin, HTN and obesity who presented to the [facility] ED with complaints of RUQ pain. Pt states she has been having this pain intermittently for one year. She is aware she has gallstones and has been told by her PCP to eat a low fat diet. She had pain roughly one month ago but resolved without need to come to the hospital. Pain started around 1:30 this morning in the RUQ that radiates into her back, burning sensation in her epigastric region, nausea and one episode of vomiting without blood. Pain was severe and constant. No fever or chills. No diarrhea, dysuria, blood in her vomit. Pt is currently without pain. " Lab work on the 30th including CBC and CMP were unremarkable.  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 PSH tubal ligation.  US 10/30: ULTRASOUND ABDOMEN LIMITED RIGHT UPPER QUADRANT  COMPARISON: 06/12/2016  FINDINGS: Gallbladder:  Multiple gallstones are noted.  No wall thickening or pericholecystic fluid is seen.  Common bile duct:  Diameter: 2.4 mm.  Liver:  No focal lesion identified. Within normal limits in parenchymal echogenicity. Portal vein is patent on color Doppler imaging with normal direction of blood flow towards the liver.  IMPRESSION: Cholelithiasis without complicating factors.  CT 10/30 FINDINGS: Lower chest: There is mild scarring in the lung bases. No lung base edema or consolidation.  Hepatobiliary: No focal liver lesions are appreciable. Gallbladder wall appears thickened. There is no appreciable biliary duct dilatation.  Pancreas: There is no evident pancreatic mass or inflammatory focus.  Spleen: No splenic lesions are evident.  Adrenals/Urinary Tract: Adrenals appear normal bilaterally. Kidneys bilaterally show no evident mass or hydronephrosis on either side. There is a 2 mm calculus in the mid right kidney. No ureteral calculus evident on either side. Urinary bladder is midline with wall thickness within normal limits.  Stomach/Bowel: There is no appreciable bowel wall or mesenteric thickening. No evident bowel obstruction. No free air or portal venous air. There is moderate stool throughout the colon.  Vascular/Lymphatic: There is no abdominal aortic aneurysm. There are no evident vascular lesions. There is no adenopathy in the abdomen or pelvis.  Reproductive: Uterus is anteverted. The uterus is inhomogeneous and somewhat lobulated in appearance consistent with leiomyomatous change. Largest individual leiomyoma arises anteriorly from the uterus measuring approximately 4.5 x 4 cm. No pelvic masses are evident apart from the uterus.  Other: Appendix appears normal. There is a ventral hernia containing fat but no bowel.  No abscess or ascites is evident in the abdomen or pelvis.  Musculoskeletal: There are no blastic or lytic bone lesions. There is degenerative change in the lumbar spine. There  are no blastic or lytic bone lesions.  IMPRESSION: 1. Gallbladder wall appears thickened and somewhat ill-defined. This appearance is concerning for potential cholecystitis. Correlation with ultrasound of the gallbladder is advised.  2. No evident bowel obstruction or bowel wall thickening. No abscess. Appendix appears normal.  3. Leiomyomatous change in the uterus.  4. Ventral hernia containing fat but no bowel.  5. 2 mm calculus mid right kidney. No hydronephrosis or ureteral calculus on either side.  The patient is a 41 year old female.   Diagnostic Studies History Judithann Sauger(Patricia King, [place]; 01/08/2017 11:36 AM) Mammogram never Pap Smear 1-5 years ago  Allergies Judithann Sauger(Patricia King, [place]; 01/08/2017 11:38 AM) Lisinopril *ANTIHYPERTENSIVES* Allergies Reconciled  Medication History Judithann Sauger(Patricia King, RMA; 01/08/2017 11:38 AM) MetFORMIN HCl (500MG  Tablet, Oral) Active. AmLODIPine Besylate (5MG  Tablet, Oral) Active. Ondansetron (4MG  Tablet Disint, as needed Oral) Active. Mobic (7.5MG  Tablet, Oral) Active. Medications Reconciled  Social History Judithann Sauger(Patricia King, [place]; 01/08/2017 11:36 AM) Alcohol use Occasional alcohol use. Caffeine use Carbonated beverages, Tea. Illicit drug use Remotely quit drug use. Tobacco use Never smoker.  Family History Judithann Sauger(Patricia King, [place]; 01/08/2017 11:36 AM) Alcohol Abuse Family Members In General, Father, Mother. Arthritis Family Members In General, Mother. Breast Cancer Family Members In General. Cerebrovascular Accident Family Members In General. Depression Daughter, Family Members In General, Mother. Diabetes Mellitus Family Members In General, Mother. Heart Disease Family Members In General, Mother. Heart disease in female family member before age 43 Hypertension Family Members In General, Father, Mother. Ischemic Bowel Disease Mother. Migraine Headache Family Members In General. Seizure disorder Family Members In  General.  Pregnancy / Birth History Judithann Sauger(Patricia King, [place]; 01/08/2017 11:36 AM) Age at menarche 10 years. Age of menopause <43 Contraceptive History Depo-provera, Oral contraceptives. Gravida 4 Irregular periods Length (months) of breastfeeding 3-6 Maternal age <15 Para 2  Other Problems Judithann Sauger(Patricia King, [place]; 01/08/2017 11:36 AM) Anxiety Disorder Back Pain Bladder Problems Chest pain Cholelithiasis Depression Diabetes Mellitus High blood pressure     Review of Systems (Jonice Cerra A. Fredricka Bonineonnor MD; 01/08/2017 11:51 AM) General Present- Weight Gain. Not Present- Appetite Loss, Chills, Fatigue, Fever, Night Sweats and Weight Loss. Skin Not Present- Change in Wart/Mole, Dryness, Hives, Jaundice, New Lesions, Non-Healing Wounds, Rash and Ulcer. HEENT Not Present- Earache, Hearing Loss, Hoarseness, Nose Bleed, Oral Ulcers, Ringing in the Ears, Seasonal Allergies, Sinus Pain, Sore Throat, Visual Disturbances, Wears glasses/contact lenses and Yellow Eyes. Respiratory Present- Snoring. Not Present- Bloody sputum, Chronic Cough, Difficulty Breathing and Wheezing. Breast Not Present- Breast Mass, Breast Pain, Nipple Discharge and Skin Changes. Cardiovascular Present- Leg Cramps and Shortness of Breath. Not Present- Chest Pain, Difficulty Breathing Lying Down, Palpitations, Rapid Heart Rate and Swelling of Extremities. Gastrointestinal Present- Change in Bowel Habits and Nausea. Not Present- Abdominal Pain, Bloating, Bloody Stool, Chronic diarrhea, Constipation, Difficulty Swallowing, Excessive gas, Gets full quickly at meals, Hemorrhoids, Indigestion, Rectal Pain and Vomiting. Female Genitourinary Present- Frequency and Urgency. Not Present- Nocturia, Painful Urination and Pelvic Pain. Musculoskeletal Present- Back Pain and Joint Stiffness. Not Present- Joint Pain, Muscle Pain, Muscle Weakness and Swelling of Extremities. Neurological Present- Numbness. Not Present- Decreased Memory,  Fainting, Headaches, Seizures, Tingling, Tremor, Trouble walking and Weakness. Endocrine Present- Hair Changes, Heat Intolerance, Hot flashes and New Diabetes. Not Present- Cold Intolerance and Excessive Hunger. Hematology Not Present- Blood Thinners, Easy Bruising, Excessive  bleeding, Gland problems, HIV and Persistent Infections. All other systems negative  Vitals Judithann Sauger RMA; 01/08/2017 11:36 AM) 01/08/2017 11:36 AM Weight: 245.8 lb Height: 62in Body Surface Area: 2.09 m Body Mass Index: 44.96 kg/m  Temp.: 98.41F  Pulse: 116 (Regular)  BP: 160/82 (Sitting, Left Arm, Standard)      Physical Exam (Wendy Mikles A. Fredricka Bonine MD; 01/08/2017 11:52 AM)  General Note: alert and well appearing  Integumentary Note: warm and dry  Head and Neck Note: no mass or thyromegaly  Eye Note: No scleral icterus. Extra ocular motions intact.  ENMT Note: Moist mucous membranes, dentition intact  Chest and Lung Exam Note: No scleral icterus. Extra ocular motions intact.  Cardiovascular Note: Regular rate and rhythm, no pedal edema  Abdomen Note: Soft, obese,nondistended. Mildly tender epigastrium. No mass or organomegaly.  Neurologic Note: Grossly intact, normal gait  Neuropsychiatric Note: Normal mood and affect. Appropriate insight.  Musculoskeletal Note: Strength symmetrical throughout, no deformity    Assessment & Plan (Kacee Koren A. Fredricka Bonine MD; 01/08/2017 11:53 AM)  BILIARY COLIC (K80.50) Story: She's been having symptoms for about a year with about 8 attacks. Her symptoms are classic for biliary colic and she has stones on her ultrasound. We discussed laparoscopic cholecystectomy and the risks of the procedure. She had several insightful questions all of which were answered. We will get her scheduled in the next few weeks.  Current Plans You are being scheduled for surgery- Our schedulers will call you.  You should hear from our office's scheduling  department within 5 working days about the location, date, and time of surgery. We try to make accommodations for patient's preferences in scheduling surgery, but sometimes the OR schedule or the surgeon's schedule prevents [place] from making those accommodations.  If you have not heard from our office [phone] in 5 working days, call the office and ask for your surgeon's nurse.  If you have other questions about your diagnosis, plan, or surgery, call the office and ask for your surgeon's nurse.  Pt Education - Pamphlet Given - Laparoscopic Gallbladder Surgery: discussed with patient and provided information. The anatomy & physiology of hepatobiliary & pancreatic function was discussed. The pathophysiology of gallbladder dysfunction was discussed. Natural history risks without surgery was discussed. I feel the risks of no intervention will lead to serious problems that outweigh the operative risks; therefore, I recommended cholecystectomy to remove the pathology. I explained laparoscopic techniques with possible need for an open approach. Probable cholangiogram to evaluate the bilary tract was explained as well.  Risks such as bleeding, infection, abscess, leak, injury to other organs, need for further treatment, heart attack, death, and other risks were discussed. I noted a good likelihood this will help address the problem. Possibility that this will not correct all abdominal symptoms was explained. Goals of post-operative recovery were discussed as well. We will work to minimize complications. An educational handout further explaining the pathology and treatment options was given as well. Questions were answered. The patient expresses understanding & wishes to proceed with surgery.

## 2017-01-20 NOTE — Patient Instructions (Signed)
Melinda Ingram Melinda Ingram  01/20/2017   Your procedure is scheduled on: 01-28-17  [facility]  Entrance Take [place]  elevators to 3rd floor to  Short Stay Center at     [phone].    Call this number if you have problems the morning of surgery [phone]    Remember: ONLY 1 PERSON MAY GO WITH YOU TO SHORT STAY TO GET  READY MORNING OF YOUR SURGERY.  Do not eat food or drink liquids :After Midnight.     Take these medicines the morning of surgery with A SIP OF WATER: amlodipine DO NOT TAKE ANY DIABETIC MEDICATIONS DAY OF YOUR SURGERY                               You may not have any metal on your body including hair pins and              piercings  Do not wear jewelry, make-up, lotions, powders or perfumes, deodorant             Do not wear nail polish.  Do not shave  48 hours prior to surgery.              Do not bring valuables to the hospital. [facility] IS NOT             RESPONSIBLE   FOR VALUABLES.  Contacts, dentures or bridgework may not be worn into surgery.       Patients discharged the day of surgery will not be allowed to drive home.  Name and phone number of your driver:  Special Instructions: N/A              Please read over the following fact sheets you were given: _____________________________________________________________________          [facility] - Preparing for [facility] Before surgery, you can play an important role.  Because skin is not sterile, your skin needs to be as free of germs as possible.  You can reduce the number of germs on your skin by washing with CHG (chlorahexidine gluconate) soap before surgery.  CHG is an antiseptic cleaner which kills germs and bonds with the skin to continue killing germs even after washing. Please DO NOT use if you have an allergy to CHG or antibacterial soaps.  If your skin becomes reddened/irritated stop using the CHG and inform your nurse when you arrive at Short Stay. Do not shave  (including legs and underarms) for at least 48 hours prior to the first CHG shower.  You may shave your face/neck. Please follow these instructions carefully:  1.  Shower with CHG Soap the night before surgery and the  morning of Surgery.  2.  If you choose to wash your hair, wash your hair first as usual with your  normal  shampoo.  3.  After you shampoo, rinse your hair and body thoroughly to remove the  shampoo.                           4.  Use CHG as you would any other liquid soap.  You can apply chg directly  to the skin and wash  Gently with a scrungie or clean washcloth.  5.  Apply the CHG Soap to your body ONLY FROM THE NECK DOWN.   Do not use on face/ open                           Wound or open sores. Avoid contact with eyes, ears mouth and genitals (private parts).                       Wash face,  Genitals (private parts) with your normal soap.             6.  Wash thoroughly, paying special attention to the area where your surgery  will be performed.  7.  Thoroughly rinse your body with warm water from the neck down.  8.  DO NOT shower/wash with your normal soap after using and rinsing off  the CHG Soap.                9.  Pat yourself dry with a clean towel.            10.  Wear clean pajamas.            11.  Place clean sheets on your bed the night of your first shower and do not  sleep with pets. Day of Surgery : Do not apply any lotions/deodorants the morning of surgery.  Please wear clean clothes to the hospital/surgery center.  FAILURE TO FOLLOW THESE INSTRUCTIONS MAY RESULT IN THE CANCELLATION OF YOUR SURGERY PATIENT SIGNATURE_________________________________  NURSE SIGNATURE__________________________________  ________________________________________________________________________

## 2017-01-20 NOTE — Progress Notes (Signed)
EKG 12-30-16 epic  CMP and cbc 12-03-16 epic hgb 11.4 repeated at preop

## 2017-01-27 ENCOUNTER — Other Ambulatory Visit: Payer: Self-pay

## 2017-01-27 ENCOUNTER — Encounter (HOSPITAL_COMMUNITY): Payer: Self-pay

## 2017-01-27 ENCOUNTER — Encounter (HOSPITAL_COMMUNITY)
Admission: RE | Admit: 2017-01-27 | Discharge: 2017-01-27 | Disposition: A | Discharge status: Home / Self Care | Payer: Managed Care, Other (non HMO) | Source: Ambulatory Visit | Attending: Surgery | Admitting: Surgery

## 2017-01-27 DIAGNOSIS — K439 Ventral hernia without obstruction or gangrene: Secondary | ICD-10-CM | POA: Diagnosis not present

## 2017-01-27 DIAGNOSIS — F419 Anxiety disorder, unspecified: Secondary | ICD-10-CM | POA: Diagnosis not present

## 2017-01-27 DIAGNOSIS — Z6841 Body Mass Index (BMI) 40.0 and over, adult: Secondary | ICD-10-CM | POA: Diagnosis not present

## 2017-01-27 DIAGNOSIS — F329 Major depressive disorder, single episode, unspecified: Secondary | ICD-10-CM | POA: Diagnosis not present

## 2017-01-27 DIAGNOSIS — E119 Type 2 diabetes mellitus without complications: Secondary | ICD-10-CM | POA: Diagnosis not present

## 2017-01-27 DIAGNOSIS — Z7984 Long term (current) use of oral hypoglycemic drugs: Secondary | ICD-10-CM | POA: Diagnosis not present

## 2017-01-27 DIAGNOSIS — Z79899 Other long term (current) drug therapy: Secondary | ICD-10-CM | POA: Diagnosis not present

## 2017-01-27 DIAGNOSIS — G40909 Epilepsy, unspecified, not intractable, without status epilepticus: Secondary | ICD-10-CM | POA: Diagnosis not present

## 2017-01-27 DIAGNOSIS — Z8249 Family history of ischemic heart disease and other diseases of the circulatory system: Secondary | ICD-10-CM | POA: Diagnosis not present

## 2017-01-27 DIAGNOSIS — I10 Essential (primary) hypertension: Secondary | ICD-10-CM | POA: Diagnosis not present

## 2017-01-27 DIAGNOSIS — K8010 Calculus of gallbladder with chronic cholecystitis without obstruction: Secondary | ICD-10-CM | POA: Diagnosis not present

## 2017-01-27 HISTORY — DX: Unspecified osteoarthritis, unspecified site: M19.90

## 2017-01-27 HISTORY — DX: Anxiety disorder, unspecified: F41.9

## 2017-01-27 HISTORY — DX: Personal history of urinary calculi: Z87.442

## 2017-01-27 HISTORY — DX: Headache: R51

## 2017-01-27 HISTORY — DX: Headache, unspecified: R51.9

## 2017-01-27 LAB — CBC
HEMATOCRIT: 36.3 % (ref 36.0–46.0)
HEMOGLOBIN: 11.6 g/dL — AB (ref 12.0–15.0)
MCH: 23.5 pg — ABNORMAL LOW (ref 26.0–34.0)
MCHC: 32 g/dL (ref 30.0–36.0)
MCV: 73.6 fL — AB (ref 78.0–100.0)
Platelets: 305 10*3/uL (ref 150–400)
RBC: 4.93 MIL/uL (ref 3.87–5.11)
RDW: 17 % — AB (ref 11.5–15.5)
WBC: 6.3 10*3/uL (ref 4.0–10.5)

## 2017-01-27 LAB — HEMOGLOBIN A1C
HEMOGLOBIN A1C: 6.4 % — AB (ref 4.8–5.6)
MEAN PLASMA GLUCOSE: 136.98 mg/dL

## 2017-01-27 LAB — GLUCOSE, CAPILLARY: GLUCOSE-CAPILLARY: 104 mg/dL — AB (ref 65–99)

## 2017-01-27 LAB — HCG, SERUM, QUALITATIVE: Preg, Serum: NEGATIVE

## 2017-01-28 ENCOUNTER — Other Ambulatory Visit: Payer: Self-pay

## 2017-01-28 ENCOUNTER — Ambulatory Visit (HOSPITAL_COMMUNITY): Payer: Managed Care, Other (non HMO) | Admitting: Certified Registered Nurse Anesthetist

## 2017-01-28 ENCOUNTER — Encounter (HOSPITAL_COMMUNITY): Payer: Self-pay | Admitting: *Deleted

## 2017-01-28 ENCOUNTER — Encounter (HOSPITAL_COMMUNITY)
Admission: RE | Disposition: A | Discharge status: Home / Self Care | Payer: Self-pay | Source: Ambulatory Visit | Attending: Surgery

## 2017-01-28 ENCOUNTER — Ambulatory Visit (HOSPITAL_COMMUNITY)
Admission: RE | Admit: 2017-01-28 | Discharge: 2017-01-28 | Disposition: A | Discharge status: Home / Self Care | Payer: Managed Care, Other (non HMO) | Source: Ambulatory Visit | Attending: Surgery | Admitting: Surgery

## 2017-01-28 DIAGNOSIS — Z79899 Other long term (current) drug therapy: Secondary | ICD-10-CM | POA: Insufficient documentation

## 2017-01-28 DIAGNOSIS — Z6841 Body Mass Index (BMI) 40.0 and over, adult: Secondary | ICD-10-CM | POA: Insufficient documentation

## 2017-01-28 DIAGNOSIS — K439 Ventral hernia without obstruction or gangrene: Secondary | ICD-10-CM | POA: Insufficient documentation

## 2017-01-28 DIAGNOSIS — E119 Type 2 diabetes mellitus without complications: Secondary | ICD-10-CM | POA: Insufficient documentation

## 2017-01-28 DIAGNOSIS — F419 Anxiety disorder, unspecified: Secondary | ICD-10-CM | POA: Insufficient documentation

## 2017-01-28 DIAGNOSIS — Z8249 Family history of ischemic heart disease and other diseases of the circulatory system: Secondary | ICD-10-CM | POA: Insufficient documentation

## 2017-01-28 DIAGNOSIS — F329 Major depressive disorder, single episode, unspecified: Secondary | ICD-10-CM | POA: Insufficient documentation

## 2017-01-28 DIAGNOSIS — K8010 Calculus of gallbladder with chronic cholecystitis without obstruction: Secondary | ICD-10-CM | POA: Insufficient documentation

## 2017-01-28 DIAGNOSIS — I10 Essential (primary) hypertension: Secondary | ICD-10-CM | POA: Insufficient documentation

## 2017-01-28 DIAGNOSIS — Z7984 Long term (current) use of oral hypoglycemic drugs: Secondary | ICD-10-CM | POA: Insufficient documentation

## 2017-01-28 DIAGNOSIS — G40909 Epilepsy, unspecified, not intractable, without status epilepticus: Secondary | ICD-10-CM | POA: Insufficient documentation

## 2017-01-28 HISTORY — PX: CHOLECYSTECTOMY: SHX55

## 2017-01-28 HISTORY — DX: Dyspnea, unspecified: R06.00

## 2017-01-28 LAB — GLUCOSE, CAPILLARY: GLUCOSE-CAPILLARY: 84 mg/dL (ref 65–99)

## 2017-01-28 SURGERY — LAPAROSCOPIC CHOLECYSTECTOMY
Anesthesia: General | Site: Abdomen

## 2017-01-28 MED ORDER — ACETAMINOPHEN 500 MG PO TABS
1000.0000 mg | ORAL_TABLET | ORAL | Status: AC
  Administered 2017-01-28: 1000 mg via ORAL
  Filled 2017-01-28: qty 2

## 2017-01-28 MED ORDER — LIDOCAINE 2% (20 MG/ML) 5 ML SYRINGE
INTRAMUSCULAR | Status: AC
  Filled 2017-01-28: qty 5

## 2017-01-28 MED ORDER — PROPOFOL 10 MG/ML IV BOLUS
INTRAVENOUS | Status: AC
  Filled 2017-01-28: qty 20

## 2017-01-28 MED ORDER — DEXAMETHASONE SODIUM PHOSPHATE 4 MG/ML IJ SOLN
INTRAMUSCULAR | Status: DC | PRN
  Administered 2017-01-28: 10 mg via INTRAVENOUS

## 2017-01-28 MED ORDER — FENTANYL CITRATE (PF) 100 MCG/2ML IJ SOLN
INTRAMUSCULAR | Status: DC | PRN
Start: 2017-01-28 — End: 2017-01-28
  Administered 2017-01-28: 50 ug via INTRAVENOUS
  Administered 2017-01-28: 100 ug via INTRAVENOUS
  Administered 2017-01-28: 50 ug via INTRAVENOUS

## 2017-01-28 MED ORDER — KETOROLAC TROMETHAMINE 30 MG/ML IJ SOLN: 30.0000 mg | Freq: Once | INTRAMUSCULAR | Status: DC | PRN

## 2017-01-28 MED ORDER — GABAPENTIN 300 MG PO CAPS
300.0000 mg | ORAL_CAPSULE | ORAL | Status: AC
  Administered 2017-01-28: 300 mg via ORAL
  Filled 2017-01-28: qty 1

## 2017-01-28 MED ORDER — HYDROMORPHONE HCL 1 MG/ML IJ SOLN
INTRAMUSCULAR | Status: AC
  Filled 2017-01-28: qty 2

## 2017-01-28 MED ORDER — SUGAMMADEX SODIUM 500 MG/5ML IV SOLN
INTRAVENOUS | Status: AC
  Filled 2017-01-28: qty 5

## 2017-01-28 MED ORDER — PROPOFOL 10 MG/ML IV BOLUS
INTRAVENOUS | Status: DC | PRN
  Administered 2017-01-28: 40 mg via INTRAVENOUS
  Administered 2017-01-28: 120 mg via INTRAVENOUS
  Administered 2017-01-28: 40 mg via INTRAVENOUS

## 2017-01-28 MED ORDER — LIDOCAINE 2% (20 MG/ML) 5 ML SYRINGE
INTRAMUSCULAR | Status: DC | PRN
  Administered 2017-01-28 (×2): 50 mg via INTRAVENOUS

## 2017-01-28 MED ORDER — LABETALOL HCL 5 MG/ML IV SOLN
INTRAVENOUS | Status: DC | PRN
  Administered 2017-01-28 (×3): 5 mg via INTRAVENOUS

## 2017-01-28 MED ORDER — SCOPOLAMINE 1 MG/3DAYS TD PT72
MEDICATED_PATCH | TRANSDERMAL | Status: DC | PRN
  Administered 2017-01-28: 1 via TRANSDERMAL

## 2017-01-28 MED ORDER — CEFAZOLIN SODIUM-DEXTROSE 2-4 GM/100ML-% IV SOLN
2.0000 g | INTRAVENOUS | Status: AC
  Administered 2017-01-28: 2 g via INTRAVENOUS
  Filled 2017-01-28: qty 100

## 2017-01-28 MED ORDER — ONDANSETRON HCL 4 MG/2ML IJ SOLN
INTRAMUSCULAR | Status: AC
  Filled 2017-01-28: qty 2

## 2017-01-28 MED ORDER — SODIUM CHLORIDE 0.9% FLUSH: 3.0000 mL | INTRAVENOUS | Status: DC | PRN

## 2017-01-28 MED ORDER — OXYCODONE HCL 5 MG PO TABS: 5.0000 mg | ORAL_TABLET | ORAL | Status: DC | PRN

## 2017-01-28 MED ORDER — PROMETHAZINE HCL 25 MG/ML IJ SOLN: 6.2500 mg | INTRAMUSCULAR | Status: DC | PRN

## 2017-01-28 MED ORDER — SUGAMMADEX SODIUM 200 MG/2ML IV SOLN
INTRAVENOUS | Status: DC | PRN
  Administered 2017-01-28: 444.4 mg via INTRAVENOUS

## 2017-01-28 MED ORDER — ACETAMINOPHEN 325 MG PO TABS: 650.0000 mg | ORAL_TABLET | ORAL | Status: DC | PRN

## 2017-01-28 MED ORDER — OXYCODONE-ACETAMINOPHEN 5-325 MG PO TABS: 1.0000 | ORAL_TABLET | Freq: Four times a day (QID) | ORAL | 0 refills | Status: DC | PRN

## 2017-01-28 MED ORDER — ONDANSETRON HCL 4 MG/2ML IJ SOLN
INTRAMUSCULAR | Status: DC | PRN
  Administered 2017-01-28: 4 mg via INTRAVENOUS

## 2017-01-28 MED ORDER — FENTANYL CITRATE (PF) 250 MCG/5ML IJ SOLN
INTRAMUSCULAR | Status: AC
  Filled 2017-01-28: qty 5

## 2017-01-28 MED ORDER — SODIUM CHLORIDE 0.9 % IV SOLN: 250.0000 mL | INTRAVENOUS | Status: DC | PRN

## 2017-01-28 MED ORDER — ACETAMINOPHEN 650 MG RE SUPP
650.0000 mg | RECTAL | Status: DC | PRN
  Filled 2017-01-28: qty 1

## 2017-01-28 MED ORDER — SCOPOLAMINE 1 MG/3DAYS TD PT72
MEDICATED_PATCH | TRANSDERMAL | Status: AC
  Filled 2017-01-28: qty 1

## 2017-01-28 MED ORDER — BUPIVACAINE-EPINEPHRINE 0.25% -1:200000 IJ SOLN
INTRAMUSCULAR | Status: DC | PRN
  Administered 2017-01-28: 20 mL

## 2017-01-28 MED ORDER — 0.9 % SODIUM CHLORIDE (POUR BTL) OPTIME
TOPICAL | Status: DC | PRN
  Administered 2017-01-28: 1000 mL

## 2017-01-28 MED ORDER — LACTATED RINGERS IV SOLN
INTRAVENOUS | Status: DC | PRN
  Administered 2017-01-28 (×2): via INTRAVENOUS

## 2017-01-28 MED ORDER — MIDAZOLAM HCL 2 MG/2ML IJ SOLN
INTRAMUSCULAR | Status: AC
  Filled 2017-01-28: qty 2

## 2017-01-28 MED ORDER — ROCURONIUM BROMIDE 50 MG/5ML IV SOSY
PREFILLED_SYRINGE | INTRAVENOUS | Status: DC | PRN
  Administered 2017-01-28: 50 mg via INTRAVENOUS

## 2017-01-28 MED ORDER — FENTANYL CITRATE (PF) 100 MCG/2ML IJ SOLN: 25.0000 ug | INTRAMUSCULAR | Status: DC | PRN

## 2017-01-28 MED ORDER — CELECOXIB 200 MG PO CAPS
200.0000 mg | ORAL_CAPSULE | ORAL | Status: AC
  Administered 2017-01-28: 200 mg via ORAL
  Filled 2017-01-28: qty 1

## 2017-01-28 MED ORDER — DEXAMETHASONE SODIUM PHOSPHATE 10 MG/ML IJ SOLN
INTRAMUSCULAR | Status: AC
  Filled 2017-01-28: qty 1

## 2017-01-28 MED ORDER — METOCLOPRAMIDE HCL 5 MG/ML IJ SOLN
INTRAMUSCULAR | Status: DC | PRN
  Administered 2017-01-28: 10 mg via INTRAVENOUS

## 2017-01-28 MED ORDER — DOCUSATE SODIUM 100 MG PO CAPS: 100.0000 mg | ORAL_CAPSULE | Freq: Two times a day (BID) | ORAL | 0 refills | Status: AC

## 2017-01-28 MED ORDER — HYDROMORPHONE HCL 1 MG/ML IJ SOLN
0.2500 mg | INTRAMUSCULAR | Status: DC | PRN
  Administered 2017-01-28 (×3): 0.5 mg via INTRAVENOUS

## 2017-01-28 MED ORDER — BUPIVACAINE-EPINEPHRINE (PF) 0.25% -1:200000 IJ SOLN
INTRAMUSCULAR | Status: AC
  Filled 2017-01-28: qty 30

## 2017-01-28 MED ORDER — MIDAZOLAM HCL 5 MG/5ML IJ SOLN
INTRAMUSCULAR | Status: DC | PRN
  Administered 2017-01-28 (×2): 1 mg via INTRAVENOUS

## 2017-01-28 MED ORDER — LACTATED RINGERS IR SOLN
Status: DC | PRN
  Administered 2017-01-28: 1000 mL

## 2017-01-28 MED ORDER — CHLORHEXIDINE GLUCONATE 4 % EX LIQD: 60.0000 mL | Freq: Once | CUTANEOUS | Status: DC

## 2017-01-28 MED ORDER — SODIUM CHLORIDE 0.9% FLUSH: 3.0000 mL | Freq: Two times a day (BID) | INTRAVENOUS | Status: DC

## 2017-01-28 SURGICAL SUPPLY — 35 items
ADH SKN CLS APL DERMABOND .7 (GAUZE/BANDAGES/DRESSINGS) ×1
APPLIER CLIP ROT 10 11.4 M/L (STAPLE) ×3
APR CLP MED LRG 11.4X10 (STAPLE) ×1
BAG SPEC RTRVL LRG 6X4 10 (ENDOMECHANICALS) ×1
CABLE HIGH FREQUENCY MONO STRZ (ELECTRODE) ×3 IMPLANT
CHLORAPREP W/TINT 26ML (MISCELLANEOUS) ×3 IMPLANT
CLIP APPLIE ROT 10 11.4 M/L (STAPLE) ×1 IMPLANT
COVER MAYO STAND STRL (DRAPES) IMPLANT
COVER SURGICAL LIGHT HANDLE (MISCELLANEOUS) ×3 IMPLANT
DECANTER SPIKE VIAL GLASS SM (MISCELLANEOUS) ×1 IMPLANT
DERMABOND ADVANCED (GAUZE/BANDAGES/DRESSINGS) ×2
DERMABOND ADVANCED .7 DNX12 (GAUZE/BANDAGES/DRESSINGS) ×1 IMPLANT
DRAPE C-ARM 42X120 X-RAY (DRAPES) IMPLANT
ELECT REM PT RETURN 15FT ADLT (MISCELLANEOUS) ×3 IMPLANT
GLOVE BIO SURGEON STRL SZ 6 (GLOVE) ×3 IMPLANT
GLOVE INDICATOR 6.5 STRL GRN (GLOVE) ×3 IMPLANT
GOWN STRL REUS W/TWL LRG LVL3 (GOWN DISPOSABLE) ×6 IMPLANT
GOWN STRL REUS W/TWL XL LVL3 (GOWN DISPOSABLE) ×4 IMPLANT
GRASPER SUT TROCAR 14GX15 (MISCELLANEOUS) ×3 IMPLANT
HEMOSTAT SNOW SURGICEL 2X4 (HEMOSTASIS) IMPLANT
KIT BASIN OR (CUSTOM PROCEDURE TRAY) ×3 IMPLANT
NDL INSUFFLATION 14GA 120MM (NEEDLE) ×1 IMPLANT
NEEDLE INSUFFLATION 14GA 120MM (NEEDLE) ×3 IMPLANT
POUCH SPECIMEN RETRIEVAL 10MM (ENDOMECHANICALS) ×3 IMPLANT
SCISSORS LAP 5X35 DISP (ENDOMECHANICALS) ×3 IMPLANT
SET CHOLANGIOGRAPH MIX (MISCELLANEOUS) IMPLANT
SET IRRIG TUBING LAPAROSCOPIC (IRRIGATION / IRRIGATOR) ×3 IMPLANT
SLEEVE XCEL OPT CAN 5 100 (ENDOMECHANICALS) ×6 IMPLANT
SUT MNCRL AB 4-0 PS2 18 (SUTURE) ×3 IMPLANT
TOWEL OR 17X26 10 PK STRL BLUE (TOWEL DISPOSABLE) ×3 IMPLANT
TOWEL OR NON WOVEN STRL DISP B (DISPOSABLE) IMPLANT
TRAY LAPAROSCOPIC (CUSTOM PROCEDURE TRAY) ×3 IMPLANT
TROCAR BLADELESS OPT 5 100 (ENDOMECHANICALS) ×3 IMPLANT
TROCAR XCEL 12X100 BLDLESS (ENDOMECHANICALS) ×3 IMPLANT
TUBING INSUF HEATED (TUBING) ×3 IMPLANT

## 2017-01-28 NOTE — Anesthesia Preprocedure Evaluation (Signed)
Anesthesia Evaluation  Patient identified by MRN, date of birth, ID band Patient awake    Reviewed: Allergy & Precautions, NPO status , Patient's Chart, lab work & pertinent test results  Airway Mallampati: II  TM Distance: >3 FB Neck ROM: Full    Dental no notable dental hx.    Pulmonary neg pulmonary ROS,    Pulmonary exam normal breath sounds clear to auscultation       Cardiovascular hypertension, Pt. on medications Normal cardiovascular exam Rhythm:Regular Rate:Normal     Neuro/Psych negative neurological ROS  negative psych ROS   GI/Hepatic negative GI ROS, Neg liver ROS,   Endo/Other  diabetesMorbid obesity  Renal/GU negative Renal ROS  negative genitourinary   Musculoskeletal negative musculoskeletal ROS (+)   Abdominal   Peds negative pediatric ROS (+)  Hematology negative hematology ROS (+)   Anesthesia Other Findings   Reproductive/Obstetrics negative OB ROS                             Anesthesia Physical Anesthesia Plan  ASA: III  Anesthesia Plan: General   Post-op Pain Management:    Induction: Intravenous  PONV Risk Score and Plan: 3 and Ondansetron, Dexamethasone, Treatment may vary due to age or medical condition and Midazolam  Airway Management Planned: Oral ETT  Additional Equipment:   Intra-op Plan:   Post-operative Plan: Extubation in OR  Informed Consent: I have reviewed the patients History and Physical, chart, labs and discussed the procedure including the risks, benefits and alternatives for the proposed anesthesia with the patient or authorized representative who has indicated his/her understanding and acceptance.   Dental advisory given  Plan Discussed with: CRNA and Surgeon  Anesthesia Plan Comments:         Anesthesia Quick Evaluation

## 2017-01-28 NOTE — Anesthesia Postprocedure Evaluation (Signed)
Anesthesia Post Note  Patient: Melinda Ingram  Procedure(s) Performed: LAPAROSCOPIC CHOLECYSTECTOMY (N/A Abdomen)     Patient location during evaluation: PACU Anesthesia Type: General Level of consciousness: awake and alert Pain management: pain level controlled Vital Signs Assessment: post-procedure vital signs reviewed and stable Respiratory status: spontaneous breathing, nonlabored ventilation, respiratory function stable and patient connected to nasal cannula oxygen Cardiovascular status: blood pressure returned to baseline and stable Postop Assessment: no apparent nausea or vomiting Anesthetic complications: no    Last Vitals:  Vitals:   01/28/17 0940 01/28/17 1230  BP: (!) 158/93 130/90  Pulse: 66 80  Resp: 16 15  Temp: 37.2 C 37.1 C  SpO2: 100% 99%    Last Pain:  Vitals:   01/28/17 0940  TempSrc: Oral                 Sahily Biddle S

## 2017-01-28 NOTE — Interval H&P Note (Signed)
History and Physical Interval Note:  01/28/2017 10:53 AM  Melinda Ingram  has presented today for surgery, with the diagnosis of biliary colic  The various methods of treatment have been discussed with the patient and family. After consideration of risks, benefits and other options for treatment, the patient has consented to  Procedure(s): LAPAROSCOPIC CHOLECYSTECTOMY (N/A) as a surgical intervention .  The patient's history has been reviewed, patient examined, no change in status, stable for surgery.  I have reviewed the patient's chart and labs.  Questions were answered to the patient's satisfaction.     Raelynne Ludwick Lollie SailsA Emilea Goga

## 2017-01-28 NOTE — Anesthesia Procedure Notes (Signed)
Procedure Name: Intubation Date/Time: 01/28/2017 11:12 AM Performed by: Deliah Boston, CRNA Pre-anesthesia Checklist: Patient identified, Emergency Drugs available, Suction available and Patient being monitored Patient Re-evaluated:Patient Re-evaluated prior to induction Oxygen Delivery Method: Circle system utilized Preoxygenation: Pre-oxygenation with 100% oxygen Induction Type: IV induction Ventilation: Mask ventilation without difficulty Laryngoscope Size: Mac and 4 Grade View: Grade II Tube type: Oral Tube size: 7.5 mm Number of attempts: 1 Airway Equipment and Method: Stylet and Oral airway Placement Confirmation: ETT inserted through vocal cords under direct vision,  positive ETCO2 and breath sounds checked- equal and bilateral Secured at: 20 cm Tube secured with: Tape Dental Injury: Teeth and Oropharynx as per pre-operative assessment

## 2017-01-28 NOTE — Discharge Instructions (Signed)
General Anesthesia, Adult, Care After These instructions provide you with information about caring for yourself after your procedure. Your health care provider may also give you more specific instructions. Your treatment has been planned according to current medical practices, but problems sometimes occur. Call your health care provider if you have any problems or questions after your procedure. What can I expect after the procedure? After the procedure, it is common to have:  Vomiting.  A sore throat.  Mental slowness.  It is common to feel:  Nauseous.  Cold or shivery.  Sleepy.  Tired.  Sore or achy, even in parts of your body where you did not have surgery.  Follow these instructions at home: For at least 24 hours after the procedure:  Do not: ? Participate in activities where you could fall or become injured. ? Drive. ? Use heavy machinery. ? Drink alcohol. ? Take sleeping pills or medicines that cause drowsiness. ? Make important decisions or sign legal documents. ? Take care of children on your own.  Rest. Eating and drinking  If you vomit, drink water, juice, or soup when you can drink without vomiting.  Drink enough fluid to keep your urine clear or pale yellow.  Make sure you have little or no nausea before eating solid foods.  Follow the diet recommended by your health care provider. General instructions  Have a responsible adult stay with you until you are awake and alert.  Return to your normal activities as told by your health care provider. Ask your health care provider what activities are safe for you.  Take over-the-counter and prescription medicines only as told by your health care provider.  If you smoke, do not smoke without supervision.  Keep all follow-up visits as told by your health care provider. This is important. Contact a health care provider if:  You continue to have nausea or vomiting at home, and medicines are not helpful.  You  cannot drink fluids or start eating again.  You cannot urinate after 8-12 hours.  You develop a skin rash.  You have fever.  You have increasing redness at the site of your procedure. Get help right away if:  You have difficulty breathing.  You have chest pain.  You have unexpected bleeding.  You feel that you are having a life-threatening or urgent problem. This information is not intended to replace advice given to you by your health care provider. Make sure you discuss any questions you have with your health care provider. Document Released: 05/26/2000 Document Revised: 07/23/2015 Document Reviewed: 02/01/2015 Elsevier Interactive Patient Education  2018 [place] ______[employer] LAPAROSCOPIC SURGERY: POST OP INSTRUCTIONS Always review your discharge instruction sheet given to you by the facility where your surgery was performed. IF YOU HAVE DISABILITY OR FAMILY LEAVE FORMS, YOU MUST BRING THEM TO THE OFFICE FOR PROCESSING.   DO NOT GIVE THEM TO YOUR DOCTOR.  1. A prescription for pain medication may be given to you upon discharge.  Take your pain medication as prescribed, if needed.  If narcotic pain medicine is not needed, then you may take acetaminophen (Tylenol) or ibuprofen (Advil) as needed. 2. Take your usually prescribed medications unless otherwise directed. 3. If you need a refill on your pain medication, please contact your pharmacy.  They will contact our office to request authorization. Prescriptions will not be filled after 5pm or on week-ends. 4. You should follow a light diet the first few days after arrival home, such as soup  and crackers, etc.  Be sure to include lots of fluids daily. 5. Most patients will experience some swelling and bruising in the area of the incisions.  Ice packs will help.  Swelling and bruising can take several days to resolve.  6. It is common to experience some constipation if taking pain medication after  surgery.  Increasing fluid intake and taking a stool softener (such as Colace) will usually help or prevent this problem from occurring.  A mild laxative (Milk of Magnesia or Miralax) should be taken according to package instructions if there are no bowel movements after 48 hours. 7. Unless discharge instructions indicate otherwise, you may remove your bandages 24-48 hours after surgery, and you may shower at that time.  You may have steri-strips (small skin tapes) in place directly over the incision.  These strips should be left on the skin for 7-10 days.  If your surgeon used skin glue on the incision, you may shower in 24 hours.  The glue will flake off over the next 2-3 weeks.  Any sutures or staples will be removed at the office during your follow-up visit. 8. ACTIVITIES:  You may resume regular (light) daily activities beginning the next day--such as daily self-care, walking, climbing stairs--gradually increasing activities as tolerated.  You may have sexual intercourse when it is comfortable.  Refrain from any heavy lifting or straining until approved by your doctor. a. You may drive when you are no longer taking prescription pain medication, you can comfortably wear a seatbelt, and you can safely maneuver your car and apply brakes. b. RETURN TO WORK:  ____1 week______________________________________________________ 9. You should see your doctor in the office for a follow-up appointment approximately 2-3 weeks after your surgery.  Make sure that you call for this appointment within a day or two after you arrive home to insure a convenient appointment time. 10. OTHER INSTRUCTIONS: __________________________________________________________________________________________________________________________ __________________________________________________________________________________________________________________________ WHEN TO CALL YOUR DOCTOR: 1. Fever over 101.0 2. Inability to  urinate 3. Continued bleeding from incision. 4. Increased pain, redness, or drainage from the incision. 5. Increasing abdominal pain  The clinic staff is available to answer your questions during regular business hours.  Please dont hesitate to call and ask to speak to one of the nurses for clinical concerns.  If you have a medical emergency, go to the nearest emergency room or call 911.  A surgeon from [facility] is always on call at the hospital. [address], [address], [place], [place]  [address] ? [address] [address], [place], [place]   [id number] [phone] ? [phone] ? FAX [phone] Web site: [email or web address]

## 2017-01-28 NOTE — Op Note (Signed)
Operative Note  Melinda Ingram 41 y.o. female [id number]  01/28/2017  Surgeon: Berna Buehelsea A Ruhama Lehew MD  Assistant: OR staff  Procedure performed: Laparoscopic Cholecystectomy  Preop diagnosis: biliary colic Post-op diagnosis/intraop findings: same  Specimens: gallbladder  EBL: minimal  Complications: none  Description of procedure: After obtaining informed consent the patient was brought to the operating room. Prophylactic antibiotics and subcutaneous heparin were administered. SCD's were applied. General endotracheal anesthesia was initiated and a formal time-out was performed. The abdomen was prepped and draped in the usual sterile fashion and the abdomen was entered using a supra-umbilical veress needle after instilling the site with local. Insufflation to 15mmHg was obtained, 5mm trocar and camera placed and gross inspection revealed no evidence of injury from our entry or other intraabdominal abnormalities. 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 Two 5mm trocars were introduced in the right midclavicular and right anterior axillary lines under direct visualization and following infiltration with local. A 12mm trocar was placed in the epigastrium. The gallbladder was retracted cephalad and the infundibulum was retracted laterally. A combination of hook electrocautery and blunt dissection was utilized to clear the peritoneum from the neck and cystic duct, circumferentially isolating the cystic artery and cystic duct and lifting the gallbladder from the cystic plate. The critical view of safety was achieved with the cystic artery, cystic duct, and liver bed visualized between them with no other structures. The artery was clipped with a single clip proximally and distally and divided as was the cystic duct with two clips on the proximal end. The gallbladder was dissected from the liver plate using electrocautery. Once freed the gallbladder was placed in an  endocatch bag and removed through the epigastric trocar site; the gallbladder did rupture within the bag and several small yellow stones were seen but contained in the bag. A small amount of bleeding on the liver bed was controlled with cautery. The right upper quadrant was aspirated and the  was irrigated and the effluent was clear. Hemostasis was once again confirmed, and reinspection of the abdomen revealed no injuries. The clips were well opposed without any bile leak from the duct or the liver bed. The 12mm trocar site in the epigastrium was closed with a figure of eight 0 vicryl in the fascia under direct visualization using a PMI device. The abdomen was desufflated and all trocars removed. The skin incisions were closed with running subcuticular monocryl and Dermabond. The patient was awakened, extubated and transported to the recovery room in stable condition.   All counts were correct at the completion of the case.

## 2017-01-28 NOTE — Transfer of Care (Signed)
Immediate Anesthesia Transfer of Care Note  Patient: Melinda Ingram  Procedure(s) Performed: Procedure(s): LAPAROSCOPIC CHOLECYSTECTOMY (N/A)  Patient Location: PACU  Anesthesia Type:General  Level of Consciousness: Patient easily awoken, sedated, comfortable, cooperative, responds to stimulation.   Airway & Oxygen Therapy: Patient spontaneously breathing, ventilating well, oxygen via simple oxygen mask.  Post-op Assessment: Report given to PACU RN, vital signs reviewed and stable, moving all extremities.   Post vital signs: Reviewed and stable.  Complications: No apparent anesthesia complications  Last Vitals:  Vitals:   01/28/17 0940 01/28/17 1230  BP: (!) 158/93   Pulse: 66   Resp: 16 15  Temp: 37.2 C 37.1 C  SpO2: 100%     Last Pain:  Vitals:   01/28/17 0940  TempSrc: Oral         Complications: No apparent anesthesia complications

## 2017-02-26 ENCOUNTER — Other Ambulatory Visit: Payer: Self-pay | Admitting: Family Medicine

## 2017-02-26 DIAGNOSIS — I10 Essential (primary) hypertension: Secondary | ICD-10-CM

## 2017-02-26 DIAGNOSIS — E119 Type 2 diabetes mellitus without complications: Secondary | ICD-10-CM

## 2017-03-11 ENCOUNTER — Encounter: Payer: Self-pay | Admitting: Family Medicine

## 2017-03-11 ENCOUNTER — Ambulatory Visit (INDEPENDENT_AMBULATORY_CARE_PROVIDER_SITE_OTHER): Payer: PRIVATE HEALTH INSURANCE | Admitting: Family Medicine

## 2017-03-11 VITALS — BP 132/87 | HR 73 | Temp 98.3°F | Resp 16 | Ht 62.0 in | Wt 246.0 lb

## 2017-03-11 DIAGNOSIS — E119 Type 2 diabetes mellitus without complications: Secondary | ICD-10-CM | POA: Diagnosis not present

## 2017-03-11 DIAGNOSIS — I10 Essential (primary) hypertension: Secondary | ICD-10-CM | POA: Diagnosis not present

## 2017-03-11 DIAGNOSIS — E7849 Other hyperlipidemia: Secondary | ICD-10-CM

## 2017-03-11 LAB — POCT URINALYSIS DIP (DEVICE)
Bilirubin Urine: NEGATIVE
GLUCOSE, UA: NEGATIVE mg/dL
Ketones, ur: NEGATIVE mg/dL
NITRITE: NEGATIVE
Protein, ur: NEGATIVE mg/dL
Specific Gravity, Urine: 1.025 (ref 1.005–1.030)
UROBILINOGEN UA: 0.2 mg/dL (ref 0.0–1.0)
pH: 6 (ref 5.0–8.0)

## 2017-03-11 MED ORDER — AMLODIPINE BESYLATE 5 MG PO TABS: 5.0000 mg | ORAL_TABLET | Freq: Every day | ORAL | 2 refills | Status: DC

## 2017-03-11 MED ORDER — METFORMIN HCL 500 MG PO TABS: 500.0000 mg | ORAL_TABLET | Freq: Every day | ORAL | 2 refills | Status: DC

## 2017-03-11 NOTE — Patient Instructions (Addendum)
DASH Eating Plan DASH stands for "Dietary Approaches to Stop Hypertension." The DASH eating plan is a healthy eating plan that has been shown to reduce high blood pressure (hypertension). It may also reduce your risk for type 2 diabetes, heart disease, and stroke. The DASH eating plan may also help with weight loss. What are tips for following this plan? General guidelines  Avoid eating more than 2,300 mg (milligrams) of salt (sodium) a day. If you have hypertension, you may need to reduce your sodium intake to 1,500 mg a day.  Limit alcohol intake to no more than 1 drink a day for nonpregnant women and 2 drinks a day for men. One drink equals 12 oz of beer, 5 oz of wine, or 1 oz of hard liquor.  Work with your health care provider to maintain a healthy body weight or to lose weight. Ask what an ideal weight is for you.  Get at least 30 minutes of exercise that causes your heart to beat faster (aerobic exercise) most days of the week. Activities may include walking, swimming, or biking.  Work with your health care provider or diet and nutrition specialist (dietitian) to adjust your eating plan to your individual calorie needs. Reading food labels  Check food labels for the amount of sodium per serving. Choose foods with less than 5 percent of the Daily Value of sodium. Generally, foods with less than 300 mg of sodium per serving fit into this eating plan.  To find whole grains, look for the word "whole" as the first word in the ingredient list. Shopping  Buy products labeled as "low-sodium" or "no salt added."  Buy fresh foods. Avoid canned foods and premade or frozen meals. Cooking  Avoid adding salt when cooking. Use salt-free seasonings or herbs instead of table salt or sea salt. Check with your health care provider or pharmacist before using salt substitutes.  Do not fry foods. Cook foods using healthy methods such as baking, boiling, grilling, and broiling instead.  Cook with  heart-healthy oils, such as olive, canola, soybean, or sunflower oil. Meal planning   Eat a balanced diet that includes: ? 5 or more servings of fruits and vegetables each day. At each meal, try to fill half of your plate with fruits and vegetables. ? Up to 6-8 servings of whole grains each day. ? Less than 6 oz of lean meat, poultry, or fish each day. A 3-oz serving of meat is about the same size as a deck of cards. One egg equals 1 oz. ? 2 servings of low-fat dairy each day. ? A serving of nuts, seeds, or beans 5 times each week. ? Heart-healthy fats. Healthy fats called Omega-3 fatty acids are found in foods such as flaxseeds and coldwater fish, like sardines, salmon, and mackerel.  Limit how much you eat of the following: ? Canned or prepackaged foods. ? Food that is high in trans fat, such as fried foods. ? Food that is high in saturated fat, such as fatty meat. ? Sweets, desserts, sugary drinks, and other foods with added sugar. ? Full-fat dairy products.  Do not salt foods before eating.  Try to eat at least 2 vegetarian meals each week.  Eat more home-cooked food and less restaurant, buffet, and fast food.  When eating at a restaurant, ask that your food be prepared with less salt or no salt, if possible. What foods are recommended? The items listed may not be a complete list. Talk with your dietitian about what   dietary choices are best for you. Grains Whole-grain or whole-wheat bread. Whole-grain or whole-wheat pasta. Brown rice. Oatmeal. Quinoa. Bulgur. Whole-grain and low-sodium cereals. Pita bread. Low-fat, low-sodium crackers. Whole-wheat flour tortillas. Vegetables Fresh or frozen vegetables (raw, steamed, roasted, or grilled). Low-sodium or reduced-sodium tomato and vegetable juice. Low-sodium or reduced-sodium tomato sauce and tomato paste. Low-sodium or reduced-sodium canned vegetables. Fruits All fresh, dried, or frozen fruit. Canned fruit in natural juice (without  added sugar). Meat and other protein foods Skinless chicken or turkey. Ground chicken or turkey. Pork with fat trimmed off. Fish and seafood. Egg whites. Dried beans, peas, or lentils. Unsalted nuts, nut butters, and seeds. Unsalted canned beans. Lean cuts of beef with fat trimmed off. Low-sodium, lean deli meat. Dairy Low-fat (1%) or fat-free (skim) milk. Fat-free, low-fat, or reduced-fat cheeses. Nonfat, low-sodium ricotta or cottage cheese. Low-fat or nonfat yogurt. Low-fat, low-sodium cheese. Fats and oils Soft margarine without trans fats. Vegetable oil. Low-fat, reduced-fat, or light mayonnaise and salad dressings (reduced-sodium). Canola, safflower, olive, soybean, and sunflower oils. Avocado. Seasoning and other foods Herbs. Spices. Seasoning mixes without salt. Unsalted popcorn and pretzels. Fat-free sweets. What foods are not recommended? The items listed may not be a complete list. Talk with your dietitian about what dietary choices are best for you. Grains Baked goods made with fat, such as croissants, muffins, or some breads. Dry pasta or rice meal packs. Vegetables Creamed or fried vegetables. Vegetables in a cheese sauce. Regular canned vegetables (not low-sodium or reduced-sodium). Regular canned tomato sauce and paste (not low-sodium or reduced-sodium). Regular tomato and vegetable juice (not low-sodium or reduced-sodium). Pickles. Olives. Fruits Canned fruit in a light or heavy syrup. Fried fruit. Fruit in cream or butter sauce. Meat and other protein foods Fatty cuts of meat. Ribs. Fried meat. Bacon. Sausage. Bologna and other processed lunch meats. Salami. Fatback. Hotdogs. Bratwurst. Salted nuts and seeds. Canned beans with added salt. Canned or smoked fish. Whole eggs or egg yolks. Chicken or turkey with skin. Dairy Whole or 2% milk, cream, and half-and-half. Whole or full-fat cream cheese. Whole-fat or sweetened yogurt. Full-fat cheese. Nondairy creamers. Whipped toppings.  Processed cheese and cheese spreads. Fats and oils Butter. Stick margarine. Lard. Shortening. Ghee. Bacon fat. Tropical oils, such as coconut, palm kernel, or palm oil. Seasoning and other foods Salted popcorn and pretzels. Onion salt, garlic salt, seasoned salt, table salt, and sea salt. Worcestershire sauce. Tartar sauce. Barbecue sauce. Teriyaki sauce. Soy sauce, including reduced-sodium. Steak sauce. Canned and packaged gravies. Fish sauce. Oyster sauce. Cocktail sauce. Horseradish that you find on the shelf. Ketchup. Mustard. Meat flavorings and tenderizers. Bouillon cubes. Hot sauce and Tabasco sauce. Premade or packaged marinades. Premade or packaged taco seasonings. Relishes. Regular salad dressings. Where to find more information:  National Heart, Lung, and Blood Institute: www.nhlbi.nih.gov  American Heart Association: www.heart.org Summary  The DASH eating plan is a healthy eating plan that has been shown to reduce high blood pressure (hypertension). It may also reduce your risk for type 2 diabetes, heart disease, and stroke.  With the DASH eating plan, you should limit salt (sodium) intake to 2,300 mg a day. If you have hypertension, you may need to reduce your sodium intake to 1,500 mg a day.  When on the DASH eating plan, aim to eat more fresh fruits and vegetables, whole grains, lean proteins, low-fat dairy, and heart-healthy fats.  Work with your health care provider or diet and nutrition specialist (dietitian) to adjust your eating plan to your individual   calorie needs. This information is not intended to replace advice given to you by your health care provider. Make sure you discuss any questions you have with your health care provider. Document Released: 02/06/2011 Document Revised: 02/11/2016 Document Reviewed: 02/11/2016 Elsevier Interactive Patient Education  2018 Elsevier Inc.    Prediabetes Eating Plan Prediabetes-also called impaired glucose tolerance or impaired  fasting glucose-is a condition that causes blood sugar (blood glucose) levels to be higher than normal. Following a healthy diet can help to keep prediabetes under control. It can also help to lower the risk of type 2 diabetes and heart disease, which are increased in people who have prediabetes. Along with regular exercise, a healthy diet:  Promotes weight loss.  Helps to control blood sugar levels.  Helps to improve the way that the body uses insulin.  What do I need to know about this eating plan?  Use the glycemic index (GI) to plan your meals. The index tells you how quickly a food will raise your blood sugar. Choose low-GI foods. These foods take a longer time to raise blood sugar.  Pay close attention to the amount of carbohydrates in the food that you eat. Carbohydrates increase blood sugar levels.  Keep track of how many calories you take in. Eating the right amount of calories will help you to achieve a healthy weight. Losing about 7 percent of your starting weight can help to prevent type 2 diabetes.  You may want to follow a Mediterranean diet. This diet includes a lot of vegetables, lean meats or fish, whole grains, fruits, and healthy oils and fats. What foods can I eat? Grains Whole grains, such as whole-wheat or whole-grain breads, crackers, cereals, and pasta. Unsweetened oatmeal. Bulgur. Barley. Quinoa. Brown rice. Corn or whole-wheat flour tortillas or taco shells. Vegetables Lettuce. Spinach. Peas. Beets. Cauliflower. Cabbage. Broccoli. Carrots. Tomatoes. Squash. Eggplant. Herbs. Peppers. Onions. Cucumbers. Brussels sprouts. Fruits Berries. Bananas. Apples. Oranges. Grapes. Papaya. Mango. Pomegranate. Kiwi. Grapefruit. Cherries. Meats and Other Protein Sources Seafood. Lean meats, such as chicken and turkey or lean cuts of pork and beef. Tofu. Eggs. Nuts. Beans. Dairy Low-fat or fat-free dairy products, such as yogurt, cottage cheese, and cheese. Beverages Water. Tea.  Coffee. Sugar-free or diet soda. Seltzer water. Milk. Milk alternatives, such as soy or almond milk. Condiments Mustard. Relish. Low-fat, low-sugar ketchup. Low-fat, low-sugar barbecue sauce. Low-fat or fat-free mayonnaise. Sweets and Desserts Sugar-free or low-fat pudding. Sugar-free or low-fat ice cream and other frozen treats. Fats and Oils Avocado. Walnuts. Olive oil. The items listed above may not be a complete list of recommended foods or beverages. Contact your dietitian for more options. What foods are not recommended? Grains Refined white flour and flour products, such as bread, pasta, snack foods, and cereals. Beverages Sweetened drinks, such as sweet iced tea and soda. Sweets and Desserts Baked goods, such as cake, cupcakes, pastries, cookies, and cheesecake. The items listed above may not be a complete list of foods and beverages to avoid. Contact your dietitian for more information. This information is not intended to replace advice given to you by your health care provider. Make sure you discuss any questions you have with your health care provider. Document Released: 07/04/2014 Document Revised: 07/26/2015 Document Reviewed: 03/15/2014 Elsevier Interactive Patient Education  2017 Elsevier Inc.  

## 2017-03-11 NOTE — Progress Notes (Signed)
Patient ID: Melinda Ingram, female    DOB: [birth year], 42 y.o.   MRN: [id number]  PCP: Melinda Dew, FNP  Chief Complaint  Patient presents with  . Hypertension    Subjective:  HPI Melinda Ingram is a 42 y.o. female presents for evaluation of hypertension.  Melinda Ingram reports recently headaches occurring more frequently throughout last few weeks. She has out of blood pressure medication for the last several weeks.  Reports no routine home monitoring of blood pressure.  He does not engage in routine physical activity.  She does not add salt to her food and makes efforts to adhere to a low-sodium diet.  She is type II diabetic however A1c is currently within prediabetic last A1c is 6.4.  She is fearful of developing diabetes.  Reports a positive family history both mother and maternal grandmother are both diabetics.  She reports some associated polydipsia and neuropathic pins and needles pain in digits.  She is a non-smoker. Current Body mass index is 44.99 kg/m, significant for morbid obesity.  Denies chest pain, shortness of breath, headache, cough, or lower extremity swelling. Social History   Socioeconomic History  . Marital status: Divorced    Spouse name: Not on file  . Number of children: Not on file  . Years of education: Not on file  . Highest education level: Not on file  Social Needs  . Financial resource strain: Not on file  . Food insecurity - worry: Not on file  . Food insecurity - inability: Not on file  . Transportation needs - medical: Not on file  . Transportation needs - non-medical: Not on file  Occupational History  . Not on file  Tobacco Use  . Smoking status: Never Smoker  . Smokeless tobacco: Never Used  Substance and Sexual Activity  . Alcohol use: Yes    Comment: occausional  . Drug use: No  . Sexual activity: Yes    Birth control/protection: Surgical  Other Topics Concern  . Not on file  Social History Narrative  . Not on file    Family History   Problem Relation Age of Onset  . Hypertension Mother   . Diabetes Mother   . Heart failure Mother   . Hypertension Father    Review of Systems  Constitutional: Negative.   Respiratory: Negative.   Cardiovascular: Negative.   Gastrointestinal: Negative.   Endocrine: Positive for polyphagia.  Genitourinary: Negative.   Musculoskeletal: Negative.   Neurological: Positive for headaches. Negative for light-headedness.  Hematological: Negative.   Psychiatric/Behavioral: Negative.     Patient Active Problem List   Diagnosis Date Noted  . Pain of joint of left ankle and foot 05/28/2016  . Elevated blood pressure reading 05/28/2016  . Foot erythema 05/28/2016  . Essential hypertension 05/28/2016    Allergies  Allergen Reactions  . Lisinopril Cough    Prior to Admission medications   Medication Sig Start Date End Date Taking? Authorizing Provider  amLODipine (NORVASC) 5 MG tablet Take 1 tablet (5 mg total) by mouth daily. 06/05/16  Yes Melinda Dew, FNP  metFORMIN (GLUCOPHAGE) 500 MG tablet Take 1 tablet (500 mg total) by mouth daily with breakfast. 06/05/16  Yes Melinda Dew, FNP    Past Medical, Surgical Family and Social History reviewed and updated.    Objective:   Today's Vitals   03/11/17 0808  BP: 132/87  Pulse: 73  Resp: 16  Temp: 98.3 F (36.8 C)  TempSrc: Oral  SpO2: 100%  Weight: 246 lb (111.6 kg)  Height: '5\' 2"'  (1.575 m)    Wt Readings from Last 3 Encounters:  03/11/17 246 lb (111.6 kg)  01/27/17 245 lb (111.1 kg)  12/30/16 250 lb (113.4 kg)    Physical Exam Constitutional: Patient appears well-developed and well-nourished. No distress. HENT: Normocephalic, atraumatic, External right and left ear normal. Oropharynx is clear and moist.  Eyes: Conjunctivae and EOM are normal. PERRLA, no scleral icterus. Neck: Normal ROM. Neck supple. No JVD. No tracheal deviation. No thyromegaly. CVS: RRR, S1/S2 +, no murmurs, no gallops, no carotid bruit.   Pulmonary: Effort and breath sounds normal, no stridor, rhonchi, wheezes, rales.  Abdominal: Soft. BS +, no distension, tenderness, rebound or guarding.  Musculoskeletal: Normal range of motion. No edema and no tenderness.  Lymphadenopathy: No lymphadenopathy noted, cervical, inguinal or axillary Neuro: Alert. Normal reflexes, muscle tone coordination. No cranial nerve deficit. Skin: Skin is warm and dry. No rash noted. Not diaphoretic. No erythema. No pallor. Psychiatric: Normal mood and affect. Behavior, judgment, thought content normal.   Assessment & Plan:  1. Essential hypertension, controlled today.  Continue current medication regimen. We have discussed target BP range and blood pressure goal. I have advised patient to check BP regularly and to call us back or report to clinic if the numbers are consistently higher than 140/90. We discussed the importance of compliance with medical therapy and DASH diet recommended, consequences of uncontrolled hypertension discussed.  If headaches were current and spite of good blood pressure control return for care for further evaluation.  To continue amlodipine as prescribed.  2. Type 2 diabetes mellitus without complication, without long-term current use of insulin ([place]), last A1c is 6.4, which is very well controlled.  Will obtain a urine Microalbumin/Creatinine Ratio and obtain a CMP to evaluate electrolyte status, kidney and liver functioning.  You are to continue metformin as prescribed.  3. Hyperlipidemia, repeating Lipid panel today, patient is fasting.     Meds ordered this encounter  Medications  . metFORMIN (GLUCOPHAGE) 500 MG tablet    Sig: Take 1 tablet (500 mg total) by mouth daily with breakfast.    Dispense:  90 tablet    Refill:  2    Order Specific Question:   Supervising Provider    Answer:   Tresa Garter [id number]  . amLODipine (NORVASC) 5 MG tablet    Sig: Take 1 tablet (5 mg total) by mouth daily.    Dispense:  90  tablet    Refill:  2    Order Specific Question:   Supervising Provider    Answer:   Tresa Garter [id number]   Orders Placed This Encounter  Procedures  . Microalbumin/Creatinine Ratio, Urine  . CMP14+EGFR  . Lipid panel  . POCT urinalysis dip (device)     Return for care 3 months with [place] Hollis, FNP-C, hypertension and diabetes follow-up.  Carroll Sage. Kenton Kingfisher, MSN, FNP-C [facility]  [address] [place], [place] [address] [phone]

## 2017-03-12 LAB — MICROALBUMIN / CREATININE URINE RATIO
Creatinine, Urine: 168.1 mg/dL
MICROALBUM., U, RANDOM: 6.5 ug/mL
Microalb/Creat Ratio: 3.9 mg/g creat (ref 0.0–30.0)

## 2017-03-12 LAB — CMP14+EGFR
ALT: 11 IU/L (ref 0–32)
AST: 12 IU/L (ref 0–40)
Albumin/Globulin Ratio: 1.5 (ref 1.2–2.2)
Albumin: 4.4 g/dL (ref 3.5–5.5)
Alkaline Phosphatase: 93 IU/L (ref 39–117)
BUN/Creatinine Ratio: 12 (ref 9–23)
BUN: 12 mg/dL (ref 6–24)
Bilirubin Total: 0.2 mg/dL (ref 0.0–1.2)
CALCIUM: 9.6 mg/dL (ref 8.7–10.2)
CO2: 26 mmol/L (ref 20–29)
CREATININE: 1 mg/dL (ref 0.57–1.00)
Chloride: 103 mmol/L (ref 96–106)
GFR calc Af Amer: 79 mL/min/{1.73_m2} (ref >59)
GFR, EST NON AFRICAN AMERICAN: 69 mL/min/{1.73_m2} (ref >59)
Globulin, Total: 3 g/dL (ref 1.5–4.5)
Glucose: 98 mg/dL (ref 65–99)
Potassium: 4.6 mmol/L (ref 3.5–5.2)
SODIUM: 142 mmol/L (ref 134–144)
TOTAL PROTEIN: 7.4 g/dL (ref 6.0–8.5)

## 2017-03-12 LAB — LIPID PANEL
CHOL/HDL RATIO: 3.1 ratio (ref 0.0–4.4)
Cholesterol, Total: 187 mg/dL (ref 100–199)
HDL: 61 mg/dL (ref >39)
LDL CALC: 112 mg/dL — AB (ref 0–99)
TRIGLYCERIDES: 68 mg/dL (ref 0–149)
VLDL Cholesterol Cal: 14 mg/dL (ref 5–40)

## 2017-03-19 ENCOUNTER — Encounter: Payer: Self-pay | Admitting: Family Medicine

## 2017-03-19 NOTE — Progress Notes (Signed)
Please mail.

## 2017-06-10 ENCOUNTER — Encounter: Payer: Self-pay | Admitting: Family Medicine

## 2017-06-10 ENCOUNTER — Ambulatory Visit (INDEPENDENT_AMBULATORY_CARE_PROVIDER_SITE_OTHER): Payer: Self-pay | Admitting: Family Medicine

## 2017-06-10 VITALS — BP 136/78 | HR 74 | Temp 98.1°F | Resp 12 | Ht 62.0 in | Wt 246.0 lb

## 2017-06-10 DIAGNOSIS — Z6841 Body Mass Index (BMI) 40.0 and over, adult: Secondary | ICD-10-CM

## 2017-06-10 DIAGNOSIS — I10 Essential (primary) hypertension: Secondary | ICD-10-CM

## 2017-06-10 DIAGNOSIS — E119 Type 2 diabetes mellitus without complications: Secondary | ICD-10-CM

## 2017-06-10 LAB — POCT URINALYSIS DIP (MANUAL ENTRY)
Bilirubin, UA: NEGATIVE
GLUCOSE UA: NEGATIVE mg/dL
Ketones, POC UA: NEGATIVE mg/dL
NITRITE UA: NEGATIVE
Protein Ur, POC: 100 mg/dL — AB
Spec Grav, UA: 1.02 (ref 1.010–1.025)
UROBILINOGEN UA: 0.2 U/dL
pH, UA: 7 (ref 5.0–8.0)

## 2017-06-10 LAB — POCT GLYCOSYLATED HEMOGLOBIN (HGB A1C): Hemoglobin A1C: 6.4

## 2017-06-10 MED ORDER — AMLODIPINE BESYLATE 5 MG PO TABS: 5.0000 mg | ORAL_TABLET | Freq: Every day | ORAL | 2 refills | Status: DC

## 2017-06-10 MED ORDER — METFORMIN HCL 500 MG PO TABS: 500.0000 mg | ORAL_TABLET | Freq: Every day | ORAL | 2 refills | Status: DC

## 2017-06-10 NOTE — Patient Instructions (Addendum)
Your hemoglobin A1c is 6.4, which is at goal. Today's weight is consistent with morbid obesity, I recommend that you start a low carbohydrate diet.  We discussed diet at length.  Recommend for breakfast high protein, for example bacon and eggs, for snack almonds large small salad or veggies, with high fat dressing, snack for fat [place] yogurt, and dinner lean meat and veggies.   Diet goal is a decrease of 10 pounds over 6 months.    Diet recommendations: Fatty Fish Animal Fat (non-hydrogenated) Lard Tallow Avocados Egg Yolks Macadamia/Brazil Nuts Butter/Ghee Mayonnaise Coconut Butter Cocoa Butter Olive Oil Coconut Oil Avocado Oil Macadamia Oil MCT Oil Fish. Preferably eating anything that is caught wild like catfish, cod, flounder, halibut, mackerel, mahi-mahi, salmon, snapper, trout, and tuna. Fattier fish is better. Shellfish. Clams, oysters, lobster, crab, scallops, mussels, and squid. Whole Eggs. Try to get them free-range from the local market if possible. You can prepare them in many different ways like fried, deviled, boiled, poached, and scrambled. Beef. Ground beef, steak, roasts, and stew meat. Stick with fattier cuts where possible. Pork. Ground pork, pork loin, pork chops, tenderloin, and ham. Watch out for added sugars and try to stick with fattier cuts. Poultry. Chicken, duck, quail, pheasant and other wild game. Offal/Organ. Heart, liver, kidney, and tongue. Offal is one of the best sources of vitamins/nutrients. Other Meat. Marton RedwoodVeal, Goat, Lamb, [place]key and other wild game. Stick with fattier cuts where possible. Tomasa BlaseBacon and [address]age. Check labels for anything cured in sugar, or if it contains extra fillers. Don't be overly concerned with nitrates. Nut Butter. Go for natural, unsweetened nuts and try to stick with fattier versions like almond butter and macadamia nut butter. Legumes (peanuts) are high in omega 6's so be careful about over-consumption.

## 2017-06-11 LAB — BASIC METABOLIC PANEL
BUN / CREAT RATIO: 14 (ref 9–23)
BUN: 11 mg/dL (ref 6–24)
CO2: 24 mmol/L (ref 20–29)
CREATININE: 0.81 mg/dL (ref 0.57–1.00)
Calcium: 9.8 mg/dL (ref 8.7–10.2)
Chloride: 104 mmol/L (ref 96–106)
GFR calc Af Amer: 102 mL/min/{1.73_m2} (ref >59)
GFR calc non Af Amer: 89 mL/min/{1.73_m2} (ref >59)
GLUCOSE: 94 mg/dL (ref 65–99)
Potassium: 4 mmol/L (ref 3.5–5.2)
SODIUM: 142 mmol/L (ref 134–144)

## 2017-06-14 NOTE — Progress Notes (Signed)
Melinda Ingram, a 42 year old female with a history of hypertension, prediabetes and morbid obesity presents accompanied by daughter for follow up. Patient has not been exercising routinely or following a lowfat, low carbohydrate diet. Body mass index is 44.99 kg/m. Patient does not check blood sugar or blood pressures at home. She currently denies headache, dizziness, chest pains, heart palpitations, bilateral lower extremity edema, polyuria, polydipsia, or polyphagia. Melinda Ingram's cardiac risk factors include sedentary lifestyle and morbid obesity.    Past Medical History:  Diagnosis Date  . Anxiety   . Arthritis   . Diabetes mellitus without complication (HCC)    type 2  . Dyspnea    walking   . Gallstone   . Headache    occasional  . History of kidney stones   . Hypertension    Stopped Lisinopril because of side effects.   Immunization History  Administered Date(s) Administered  . Pneumococcal Polysaccharide-23 08/07/2016  . Tdap 06/05/2016   Social History   Socioeconomic History  . Marital status: Divorced    Spouse name: Not on file  . Number of children: Not on file  . Years of education: Not on file  . Highest education level: Not on file  Occupational History  . Not on file  Social Needs  . Financial resource strain: Not on file  . Food insecurity:    Worry: Not on file    Inability: Not on file  . Transportation needs:    Medical: Not on file    Non-medical: Not on file  Tobacco Use  . Smoking status: Never Smoker  . Smokeless tobacco: Never Used  Substance and Sexual Activity  . Alcohol use: Yes    Comment: occausional  . Drug use: No  . Sexual activity: Yes    Birth control/protection: Surgical  Lifestyle  . Physical activity:    Days per week: Not on file    Minutes per session: Not on file  . Stress: Not on file  Relationships  . Social connections:    Talks on phone: Not on file    Gets together: Not on file    Attends religious service: Not on  file    Active member of club or organization: Not on file    Attends meetings of clubs or organizations: Not on file    Relationship status: Not on file  . Intimate partner violence:    Fear of current or ex partner: Not on file    Emotionally abused: Not on file    Physically abused: Not on file    Forced sexual activity: Not on file  Other Topics Concern  . Not on file  Social History Narrative  . Not on file   Review of Systems  Constitutional: Negative.   HENT: Negative.   Respiratory: Positive for shortness of breath (occasionally).   Cardiovascular: Negative.   Gastrointestinal: Negative.   Genitourinary: Negative.   Musculoskeletal: Negative.   Skin: Negative.   Neurological: Negative.   Endo/Heme/Allergies: Negative.   Psychiatric/Behavioral: Negative.     Physical Exam  Constitutional: She is oriented to person, place, and time. She appears well-developed and well-nourished.  HENT:  Head: Normocephalic and atraumatic.  Right Ear: External ear normal.  Left Ear: External ear normal.  Nose: Nose normal.  Mouth/Throat: Oropharynx is clear and moist.  Eyes: Pupils are equal, round, and reactive to light. Conjunctivae and EOM are normal.  Cardiovascular: Normal rate, regular rhythm, normal heart sounds and intact distal pulses.  Pulmonary/Chest:  Effort normal and breath sounds normal.  Abdominal: Soft. Bowel sounds are normal.  Musculoskeletal: Normal range of motion.  Neurological: She is alert and oriented to person, place, and time.  Skin: Skin is warm and dry.  Psychiatric: She has a normal mood and affect. Her behavior is normal. Judgment and thought content normal.   BP 136/78 (BP Location: Left Arm, Patient Position: Sitting, Cuff Size: Large)   Pulse 74   Temp 98.1 F (36.7 C) (Oral)   Resp 12   Ht 5\' 2"  (1.575 m)   Wt 246 lb (111.6 kg)   LMP 06/05/2017   SpO2 99%   BMI 44.99 kg/m  Plan  1. Type 2 diabetes mellitus without complication, without  long-term current use of insulin (HCC) Hemoglobin a1C is 6.4, which is at goal. Will continue Metformin 500 mg daily.  Also, recommend a carbohydrate modified diet.  Given copy of low carbohydrate diet.   - POCT glycosylated hemoglobin (Hb A1C) - metFORMIN (GLUCOPHAGE) 500 MG tablet; Take 1 tablet (500 mg total) by mouth daily with breakfast.  Dispense: 90 tablet; Refill: 2 - Basic Metabolic Panel  2. Essential hypertension Blood pressure is at goal on current medication regimen.  No medication changes warranted on today Will review renal functioning as results become available - POCT urinalysis dipstick - amLODipine (NORVASC) 5 MG tablet; Take 1 tablet (5 mg total) by mouth daily.  Dispense: 90 tablet; Refill: 2 - Basic Metabolic Panel  3. Morbid obesity with BMI of 40.0-44.9, adult (HCC) Weight loss goal is 10 pounds over the next 3-6 months Recommend a lowfat, low carbohydrate diet divided over 5-6 small meals, increase water intake to 6-8 glasses, and 150 minutes per week of cardiovascular exercise.    RTC: 6 months for chronic conditions  Nolon Nations  MSN, FNP-C [facility] [address] [place], [place] [address] [phone]

## 2017-12-07 ENCOUNTER — Ambulatory Visit: Payer: Self-pay | Admitting: Family Medicine

## 2018-02-09 ENCOUNTER — Other Ambulatory Visit: Payer: Self-pay | Admitting: Family Medicine

## 2018-02-09 DIAGNOSIS — Z1231 Encounter for screening mammogram for malignant neoplasm of breast: Secondary | ICD-10-CM

## 2018-02-12 ENCOUNTER — Telehealth (HOSPITAL_COMMUNITY): Payer: Self-pay | Admitting: Obstetrics and Gynecology

## 2018-02-12 NOTE — Telephone Encounter (Signed)
Called patient and left message regarding scholarship program for screening mammogram.

## 2018-02-15 ENCOUNTER — Telehealth (HOSPITAL_COMMUNITY): Payer: Self-pay

## 2018-02-15 NOTE — Telephone Encounter (Signed)
Returned patient's phone call but could not leave a message because voicemail box was full.

## 2018-02-19 ENCOUNTER — Ambulatory Visit (INDEPENDENT_AMBULATORY_CARE_PROVIDER_SITE_OTHER): Payer: Self-pay | Admitting: Family Medicine

## 2018-02-19 ENCOUNTER — Encounter: Payer: Self-pay | Admitting: Family Medicine

## 2018-02-19 VITALS — BP 130/76 | HR 72 | Temp 98.1°F | Resp 16 | Ht 62.0 in | Wt 254.0 lb

## 2018-02-19 DIAGNOSIS — E119 Type 2 diabetes mellitus without complications: Secondary | ICD-10-CM

## 2018-02-19 DIAGNOSIS — M791 Myalgia, unspecified site: Secondary | ICD-10-CM

## 2018-02-19 DIAGNOSIS — I10 Essential (primary) hypertension: Secondary | ICD-10-CM

## 2018-02-19 DIAGNOSIS — Z23 Encounter for immunization: Secondary | ICD-10-CM

## 2018-02-19 LAB — POCT URINALYSIS DIPSTICK
Bilirubin, UA: NEGATIVE
Blood, UA: NEGATIVE
Glucose, UA: NEGATIVE
Ketones, UA: NEGATIVE
Leukocytes, UA: NEGATIVE
Nitrite, UA: NEGATIVE
Protein, UA: NEGATIVE
Spec Grav, UA: 1.02 (ref 1.010–1.025)
Urobilinogen, UA: 0.2 E.U./dL
pH, UA: 7 (ref 5.0–8.0)

## 2018-02-19 LAB — POCT GLYCOSYLATED HEMOGLOBIN (HGB A1C): Hemoglobin A1C: 6.2 % — AB (ref 4.0–5.6)

## 2018-02-19 MED ORDER — METFORMIN HCL 500 MG PO TABS: 500.0000 mg | ORAL_TABLET | Freq: Every day | ORAL | 2 refills | Status: DC

## 2018-02-19 MED ORDER — MELOXICAM 15 MG PO TABS: 15.0000 mg | ORAL_TABLET | Freq: Every day | ORAL | 2 refills | Status: DC

## 2018-02-19 MED ORDER — AMMONIUM LACTATE 12 % EX LOTN: 1.0000 "application " | TOPICAL_LOTION | Freq: Every day | CUTANEOUS | 2 refills | Status: DC

## 2018-02-19 MED ORDER — BLOOD GLUCOSE MONITOR KIT: PACK | 0 refills | Status: DC

## 2018-02-19 MED ORDER — AMLODIPINE BESYLATE 5 MG PO TABS: 5.0000 mg | ORAL_TABLET | Freq: Every day | ORAL | 2 refills | Status: DC

## 2018-02-19 NOTE — Patient Instructions (Signed)
Diabetes Mellitus and Foot Care  Foot care is an important part of your health, especially when you have diabetes. Diabetes may cause you to have problems because of poor blood flow (circulation) to your feet and legs, which can cause your skin to:   Become thinner and drier.   Break more easily.   Heal more slowly.   Peel and crack.  You may also have nerve damage (neuropathy) in your legs and feet, causing decreased feeling in them. This means that you may not notice minor injuries to your feet that could lead to more serious problems. Noticing and addressing any potential problems early is the best way to prevent future foot problems.  How to care for your feet  Foot hygiene   Wash your feet daily with warm water and mild soap. Do not use hot water. Then, pat your feet and the areas between your toes until they are completely dry. Do not soak your feet as this can dry your skin.   Trim your toenails straight across. Do not dig under them or around the cuticle. File the edges of your nails with an emery board or nail file.   Apply a moisturizing lotion or petroleum jelly to the skin on your feet and to dry, brittle toenails. Use lotion that does not contain alcohol and is unscented. Do not apply lotion between your toes.  Shoes and socks   Wear clean socks or stockings every day. Make sure they are not too tight. Do not wear knee-high stockings since they may decrease blood flow to your legs.   Wear shoes that fit properly and have enough cushioning. Always look in your shoes before you put them on to be sure there are no objects inside.   To break in new shoes, wear them for just a few hours a day. This prevents injuries on your feet.  Wounds, scrapes, corns, and calluses   Check your feet daily for blisters, cuts, bruises, sores, and redness. If you cannot see the bottom of your feet, use a mirror or ask someone for help.   Do not cut corns or calluses or try to remove them with medicine.   If you  find a minor scrape, cut, or break in the skin on your feet, keep it and the skin around it clean and dry. You may clean these areas with mild soap and water. Do not clean the area with peroxide, alcohol, or iodine.   If you have a wound, scrape, corn, or callus on your foot, look at it several times a day to make sure it is healing and not infected. Check for:  ? Redness, swelling, or pain.  ? Fluid or blood.  ? Warmth.  ? Pus or a bad smell.  General instructions   Do not cross your legs. This may decrease blood flow to your feet.   Do not use heating pads or hot water bottles on your feet. They may burn your skin. If you have lost feeling in your feet or legs, you may not know this is happening until it is too late.   Protect your feet from hot and cold by wearing shoes, such as at the beach or on hot pavement.   Schedule a complete foot exam at least once a year (annually) or more often if you have foot problems. If you have foot problems, report any cuts, sores, or bruises to your health care provider immediately.  Contact a health care provider if:     You have a medical condition that increases your risk of infection and you have any cuts, sores, or bruises on your feet.   You have an injury that is not healing.   You have redness on your legs or feet.   You feel burning or tingling in your legs or feet.   You have pain or cramps in your legs and feet.   Your legs or feet are numb.   Your feet always feel cold.   You have pain around a toenail.  Get help right away if:   You have a wound, scrape, corn, or callus on your foot and:  ? You have pain, swelling, or redness that gets worse.  ? You have fluid or blood coming from the wound, scrape, corn, or callus.  ? Your wound, scrape, corn, or callus feels warm to the touch.  ? You have pus or a bad smell coming from the wound, scrape, corn, or callus.  ? You have a fever.  ? You have a red line going up your leg.  Summary   Check your feet every day  for cuts, sores, red spots, swelling, and blisters.   Moisturize feet and legs daily.   Wear shoes that fit properly and have enough cushioning.   If you have foot problems, report any cuts, sores, or bruises to your health care provider immediately.   Schedule a complete foot exam at least once a year (annually) or more often if you have foot problems.  This information is not intended to replace advice given to you by your health care provider. Make sure you discuss any questions you have with your health care provider.  Document Released: 02/15/2000 Document Revised: 04/01/2017 Document Reviewed: 03/21/2016  Elsevier Interactive Patient Education  2019 [employer]

## 2018-02-19 NOTE — Progress Notes (Signed)
Patient [facility]   Progress Note: General Provider: Lanae Boast, FNP  SUBJECTIVE:   Melinda Ingram is a 42 y.o. female who  has a past medical history of Anxiety, Arthritis, Diabetes mellitus without complication ([place]), Dyspnea, Gallstone, Headache, History of kidney stones, and Hypertension.. Patient presents today for Hypertension; Blurred Vision; and Diabetes   Patient has not had her yearly eye exam due to lack of insurance.  She does not check her blood sugars on a regular basis.  Does not check blood pressures on a regular basis.  She is not following a carb modified or low-sodium diet.  Patient denies exercising daily.  She states that her vision has changed in the last several months and it is harder for her to see far away. Review of Systems  Constitutional: Negative.   HENT: Negative.   Eyes: Positive for blurred vision.  Respiratory: Negative.   Cardiovascular: Negative.   Gastrointestinal: Negative.   Genitourinary: Negative.   Musculoskeletal: Negative.   Skin: Negative.   Neurological: Negative.   Psychiatric/Behavioral: Negative.      OBJECTIVE: BP 130/76 (BP Location: Left Arm, Patient Position: Sitting, Cuff Size: Large)   Pulse 72   Temp 98.1 F (36.7 C) (Oral)   Resp 16   Ht _0  (1.575 m)   Wt 254 lb (115.2 kg)   LMP 01/23/2018   SpO2 100%   BMI 46.46 kg/m   Wt Readings from Last 3 Encounters:  02/19/18 254 lb (115.2 kg)  06/10/17 246 lb (111.6 kg)  03/11/17 246 lb (111.6 kg)     Physical Exam Vitals signs and nursing note reviewed.  Constitutional:      General: She is not in acute distress.    Appearance: She is well-developed.  HENT:     Head: Normocephalic and atraumatic.  Eyes:     Conjunctiva/sclera: Conjunctivae normal.     Pupils: Pupils are equal, round, and reactive to light.  Neck:     Musculoskeletal: Normal range of motion.  Cardiovascular:     Rate and Rhythm: Normal rate and  regular rhythm.     Heart sounds: Normal heart sounds.  Pulmonary:     Effort: Pulmonary effort is normal. No respiratory distress.     Breath sounds: Normal breath sounds.  Abdominal:     General: Bowel sounds are normal. There is no distension.     Palpations: Abdomen is soft.  Musculoskeletal: Normal range of motion.  Skin:    General: Skin is warm and dry.  Neurological:     Mental Status: She is alert and oriented to person, place, and time.  Psychiatric:        Behavior: Behavior normal.        Thought Content: Thought content normal.     ASSESSMENT/PLAN:   1. Essential hypertension - Urinalysis Dipstick - amLODipine (NORVASC) 5 MG tablet; Take 1 tablet (5 mg total) by mouth daily.  Dispense: 90 tablet; Refill: 2  2. Type 2 diabetes mellitus without complication, without long-term current use of insulin ([place]) Advised patient to have regular eye exam at a local store such as [facility] who provides discounted low cost eye exam.  Patient states that she will follow-up with this. Patient with A1c of 6.2 today.  No medication changes warranted at the present time advised patient to check blood sugars every morning fasting. - HgB A1c - blood glucose meter kit and supplies KIT; Dispense based on patient and insurance preference.  Use up to four times daily as directed. (FOR ICD-9 250.00, 250.01).  Dispense: 1 each; Refill: 0 - Lipid Panel With LDL/HDL Ratio; Future - Comprehensive metabolic panel; Future - CBC With Differential; Future - TSH; Future - VITAMIN D 25 Hydroxy (Vit-D Deficiency, Fractures); Future - metFORMIN (GLUCOPHAGE) 500 MG tablet; Take 1 tablet (500 mg total) by mouth daily with breakfast.  Dispense: 90 tablet; Refill: 2  3. Myalgia - meloxicam (MOBIC) 15 MG tablet; Take 1 tablet (15 mg total) by mouth daily.  Dispense: 30 tablet; Refill: 2    Return in about 3 months (around 05/21/2018) for DM .    The patient was given clear instructions to go to ER or  return to medical center if symptoms do not improve, worsen or new problems develop. The patient verbalized understanding and agreed with plan of care.   [name]. [name], FNP-BC Patient [facility] [address] [place], [place] [address] [phone]

## 2018-05-10 ENCOUNTER — Other Ambulatory Visit: Payer: Self-pay | Admitting: Medical

## 2018-05-10 ENCOUNTER — Ambulatory Visit
Admission: RE | Admit: 2018-05-10 | Discharge: 2018-05-10 | Disposition: A | Discharge status: Home / Self Care | Payer: PRIVATE HEALTH INSURANCE | Source: Ambulatory Visit | Attending: Family Medicine | Admitting: Family Medicine

## 2018-05-10 DIAGNOSIS — Z1231 Encounter for screening mammogram for malignant neoplasm of breast: Secondary | ICD-10-CM

## 2018-05-20 ENCOUNTER — Telehealth: Payer: Self-pay

## 2018-05-20 NOTE — Telephone Encounter (Signed)
Patient given negative mammogram results and recommendation to repeat in 1 year.

## 2018-05-21 ENCOUNTER — Ambulatory Visit: Payer: Self-pay | Admitting: Family Medicine

## 2018-06-16 IMAGING — DX DG FOOT COMPLETE 3+V*L*
3 series · 3 of 3 positions shown · non-contrast
Comparison: 10/15/2009.

CLINICAL DATA: Left foot pain and swelling since yesterday, no
injury.

EXAM:
LEFT FOOT - COMPLETE 3+ VIEW

[foot ap]
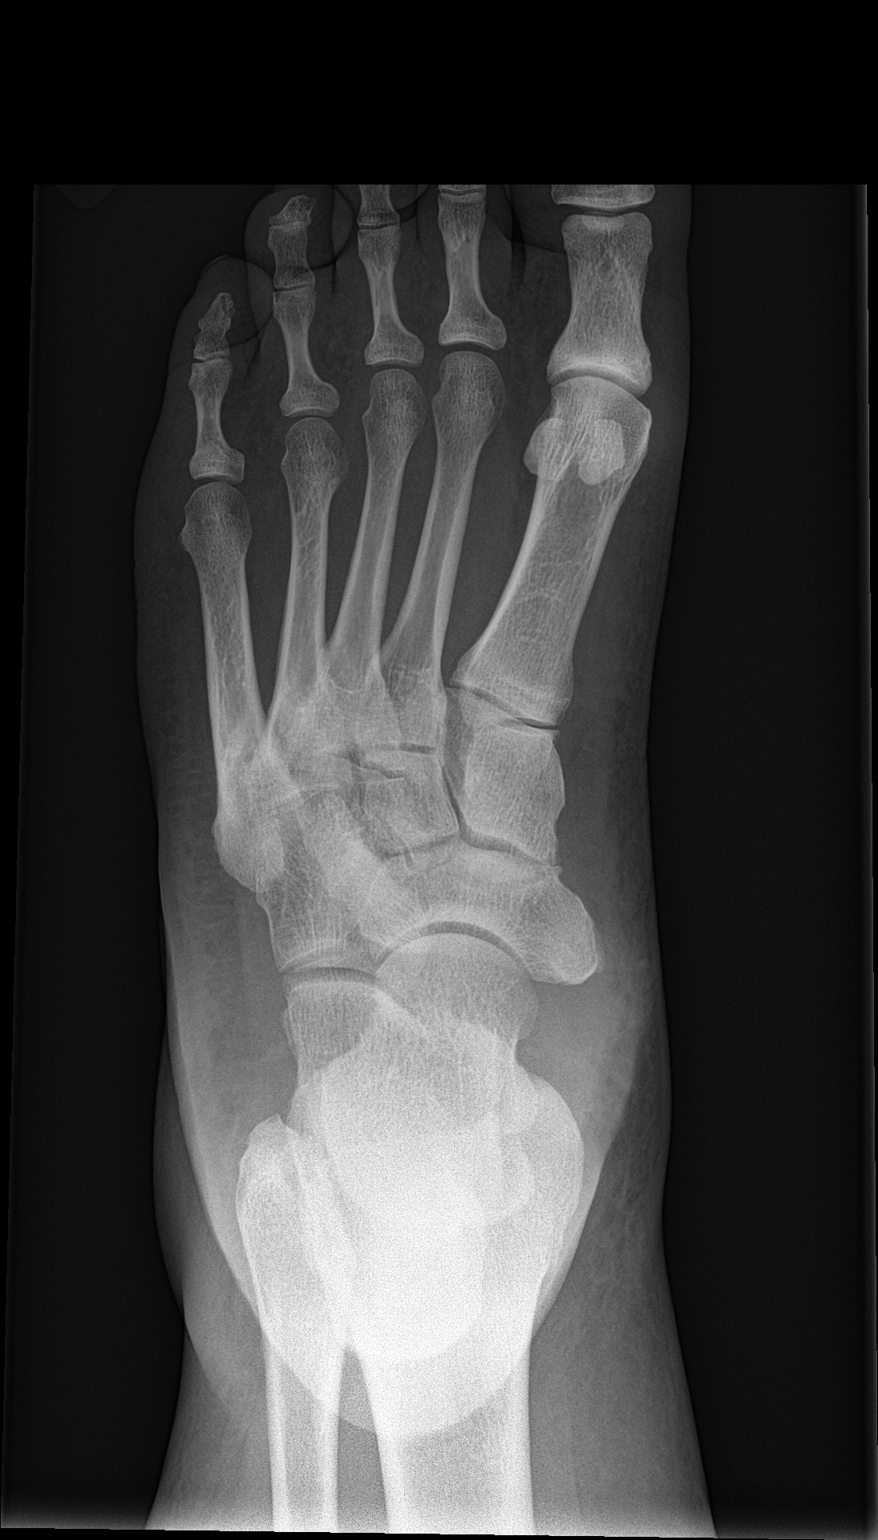

[foot obl]
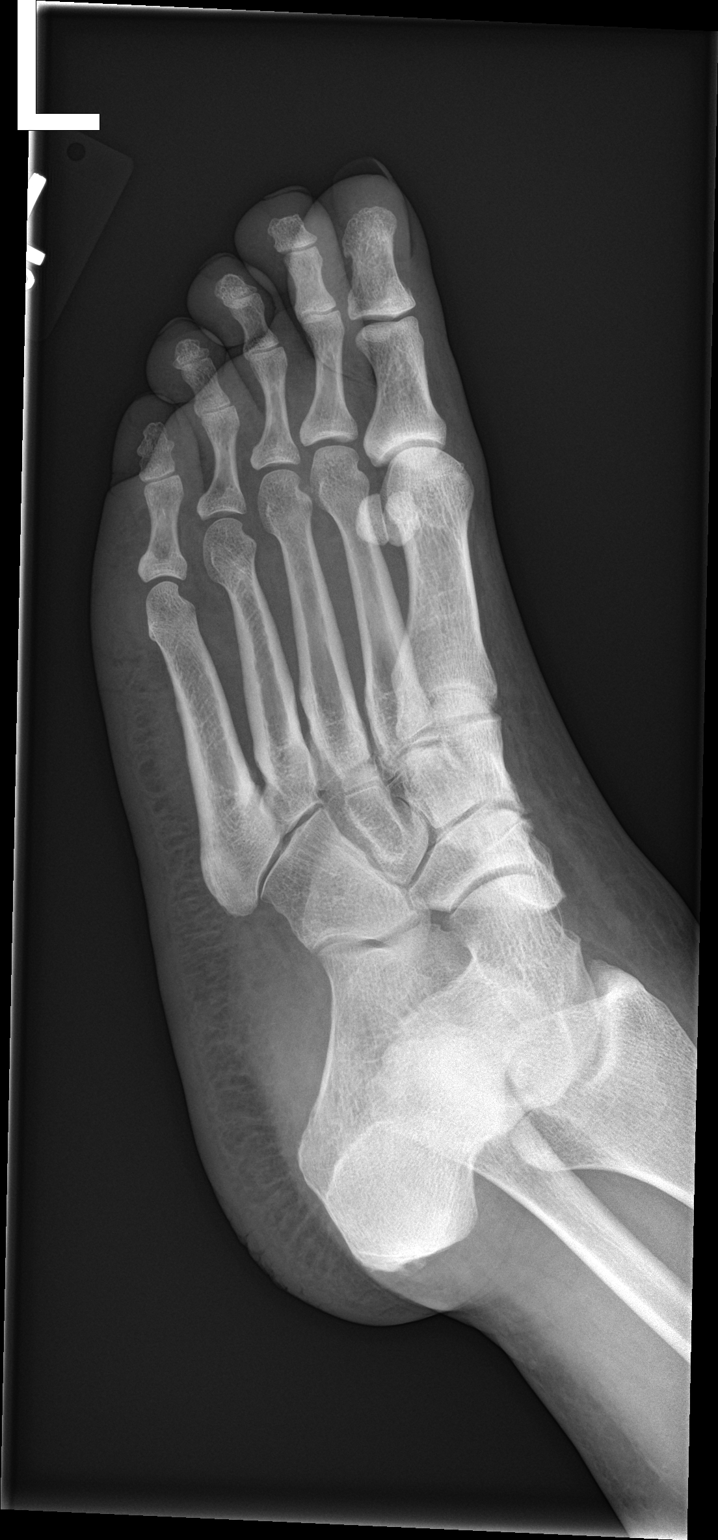

[foot lat]
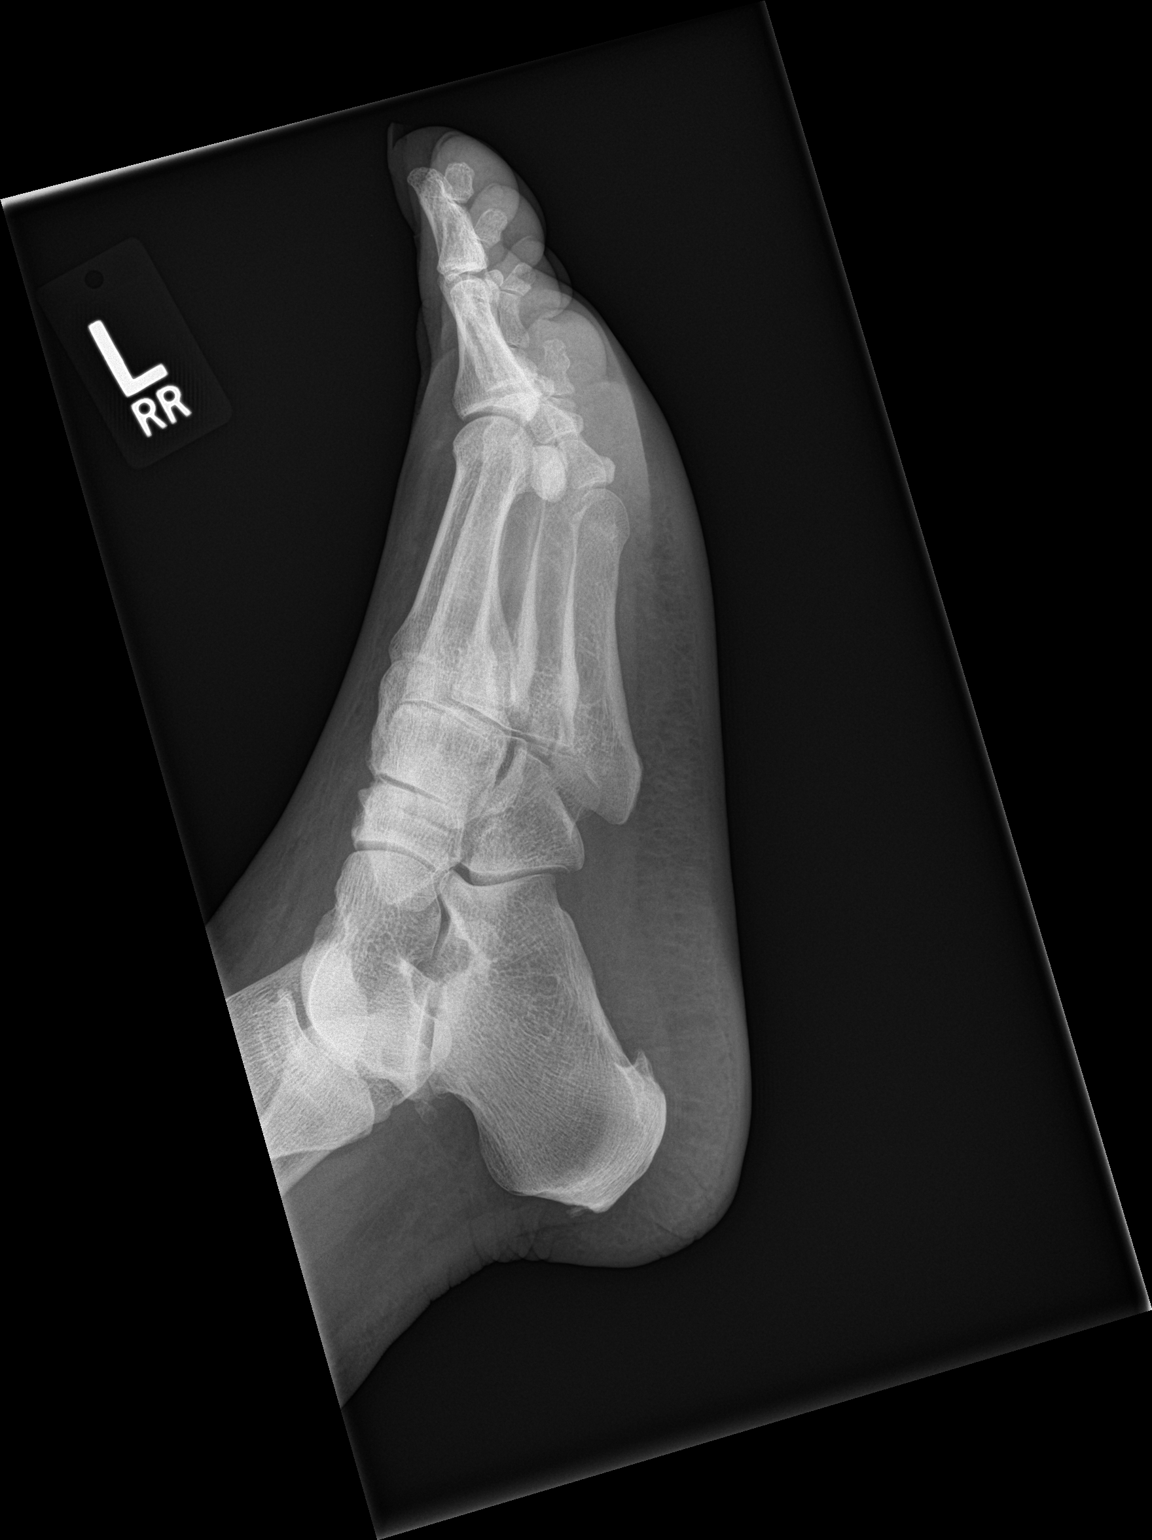

[3 of 3 positions shown; findings below may reference images not displayed]

FINDINGS: No acute osseous or joint abnormality. No degenerative changes.
Calcaneal spurs are noted.
IMPRESSION: No findings to explain the patient's pain.

## 2018-10-06 ENCOUNTER — Other Ambulatory Visit: Payer: Self-pay

## 2018-10-06 ENCOUNTER — Encounter: Payer: Self-pay | Admitting: Family Medicine

## 2018-10-06 ENCOUNTER — Ambulatory Visit (INDEPENDENT_AMBULATORY_CARE_PROVIDER_SITE_OTHER): Payer: PRIVATE HEALTH INSURANCE | Admitting: Family Medicine

## 2018-10-06 VITALS — BP 146/88 | HR 73 | Temp 97.9°F | Resp 16 | Ht 62.0 in | Wt 258.0 lb

## 2018-10-06 DIAGNOSIS — Z1321 Encounter for screening for nutritional disorder: Secondary | ICD-10-CM

## 2018-10-06 DIAGNOSIS — E119 Type 2 diabetes mellitus without complications: Secondary | ICD-10-CM | POA: Diagnosis not present

## 2018-10-06 DIAGNOSIS — I10 Essential (primary) hypertension: Secondary | ICD-10-CM | POA: Diagnosis not present

## 2018-10-06 LAB — POCT URINALYSIS DIPSTICK
Bilirubin, UA: NEGATIVE
Blood, UA: NEGATIVE
Glucose, UA: NEGATIVE
Ketones, UA: NEGATIVE
Leukocytes, UA: NEGATIVE
Nitrite, UA: NEGATIVE
Protein, UA: NEGATIVE
Spec Grav, UA: 1.025 (ref 1.010–1.025)
Urobilinogen, UA: 0.2 E.U./dL
pH, UA: 6 (ref 5.0–8.0)

## 2018-10-06 LAB — POCT GLYCOSYLATED HEMOGLOBIN (HGB A1C): Hemoglobin A1C: 6.7 % — AB (ref 4.0–5.6)

## 2018-10-06 MED ORDER — AMLODIPINE BESYLATE 5 MG PO TABS: 5.0000 mg | ORAL_TABLET | Freq: Every day | ORAL | 2 refills | Status: DC

## 2018-10-06 MED ORDER — METFORMIN HCL 500 MG PO TABS: 500.0000 mg | ORAL_TABLET | Freq: Two times a day (BID) | ORAL | 2 refills | Status: DC

## 2018-10-06 NOTE — Progress Notes (Signed)
[facility]   Progress Note: General Provider: Mike GipAndre Ailene Royal, FNP  SUBJECTIVE:   Melinda Ingram is a 43 y.o. female who  has a past medical history of Anxiety, Arthritis, Diabetes mellitus without complication (HCC), Dyspnea, Gallstone, Headache, History of kidney stones, and Hypertension.. Patient presents today for Edema (in feet), Back Pain (lower back pain ), and Weight Gain (asking for something to help loose weight )  Patient presents for DM2 follow up. Has not taken metformin consistently. Patient did not take HTN medication today. She is not exercising on a regular basis or following a low carb low sodium diet. She would like to have a prescription for phentermine to aid in weight loss. She states that she is having swelling of the bilateral lower extremities. Patient states that she is having increased lower back pain that is chronic.   Review of Systems  Constitutional: Negative.   HENT: Negative.   Eyes: Negative.   Respiratory: Negative.   Cardiovascular: Positive for leg swelling.  Gastrointestinal: Negative.   Genitourinary: Negative.   Musculoskeletal: Positive for back pain.  Skin: Negative.   Neurological: Negative.   Psychiatric/Behavioral: Negative.      OBJECTIVE: BP (!) 146/88   Pulse 73   Temp 97.9 F (36.6 C) (Oral)   Resp 16   Ht 5\' 2"  (1.575 m)   Wt 258 lb (117 kg)   LMP 09/20/2018   SpO2 100%   BMI 47.19 kg/m   Wt Readings from Last 3 Encounters:  10/06/18 258 lb (117 kg)  02/19/18 254 lb (115.2 kg)  06/10/17 246 lb (111.6 kg)     Physical Exam Vitals signs and nursing note reviewed.  Constitutional:      General: She is not in acute distress.    Appearance: Normal appearance.  HENT:     Head: Normocephalic and atraumatic.  Eyes:     Extraocular Movements: Extraocular movements intact.     Conjunctiva/sclera: Conjunctivae normal.     Pupils: Pupils are equal, round, and  reactive to light.  Cardiovascular:     Rate and Rhythm: Normal rate and regular rhythm.     Heart sounds: No murmur.  Pulmonary:     Effort: Pulmonary effort is normal.     Breath sounds: Normal breath sounds.  Musculoskeletal: Normal range of motion.     Right lower leg: Edema present.     Left lower leg: Edema (bilateral mild pitting edema. ) present.  Skin:    General: Skin is warm and dry.  Neurological:     Mental Status: She is alert and oriented to person, place, and time.  Psychiatric:        Mood and Affect: Mood normal.        Behavior: Behavior normal.        Thought Content: Thought content normal.        Judgment: Judgment normal.    Diabetic Foot Exam - Simple   Simple Foot Form Diabetic Foot exam was performed with the following findings: Yes 10/06/2018  9:57 AM  Visual Inspection No deformities, no ulcerations, no other skin breakdown bilaterally: Yes Sensation Testing Intact to touch and monofilament testing bilaterally: Yes Pulse Check Posterior Tibialis and Dorsalis pulse intact bilaterally: Yes Comments      ASSESSMENT/PLAN:   1. Essential hypertension - Urinalysis Dipstick - Microalbumin/Creatinine Ratio, Urine - amLODipine (NORVASC) 5 MG tablet; Take 1 tablet (5 mg total) by mouth daily.  Dispense: 90  tablet; Refill: 2 - Comprehensive metabolic panel  2. Type 2 diabetes mellitus without complication, without long-term current use of insulin ([place]) Discussed taking metformin consistently. A1C is 6.7 and has increased from last visit. Increased metformin from QD to BID.  - HgB A1c - metFORMIN (GLUCOPHAGE) 500 MG tablet; Take 1 tablet (500 mg total) by mouth 2 (two) times daily with a meal.  Dispense: 180 tablet; Refill: 2 - Comprehensive metabolic panel - Lipid Panel - HM Diabetes Foot Exam  3. Encounter for vitamin deficiency screening - Vitamin D, 25-hydroxy  4. Class 3 severe obesity due to excess calories with serious comorbidity in adult,  unspecified BMI ([place]) Discussed that patient is not a good candidate for phentermine due to uncontrolled HTN. Discussed that the edema is from high sodium foods that she is consuming. Gave patient handout on weight loss and       Return in about 3 months (around 01/06/2019) for DM2.    The patient was given clear instructions to go to ER or return to medical center if symptoms do not improve, worsen or new problems develop. The patient verbalized understanding and agreed with plan of care.   [name]. [name], FNP-BC Patient [facility] [address] [place], [place] [address] [phone]

## 2018-10-06 NOTE — Patient Instructions (Signed)
Diabetes Mellitus and Standards of Medical Care Managing diabetes (diabetes mellitus) can be complicated. Your diabetes treatment may be managed by a team of health care providers, including:  A physician who specializes in diabetes (endocrinologist).  A nurse practitioner or physician assistant.  Nurses.  A diet and nutrition specialist (registered dietitian).  A certified diabetes educator (CDE).  An exercise specialist.  A pharmacist.  An eye doctor.  A foot specialist (podiatrist).  A dentist.  A primary care provider.  A mental health provider. Your health care providers follow guidelines to help you get the best quality of care. The following schedule is a general guideline for your diabetes management plan. Your health care providers may give you more specific instructions. Physical exams Upon being diagnosed with diabetes mellitus, and each year after that, your health care provider will ask about your medical and family history. He or she will also do a physical exam. Your exam may include:  Measuring your height, weight, and body mass index (BMI).  Checking your blood pressure. This will be done at every routine medical visit. Your target blood pressure may vary depending on your medical conditions, your age, and other factors.  Thyroid gland exam.  Skin exam.  Screening for damage to your nerves (peripheral neuropathy). This may include checking the pulse in your legs and feet and checking the level of sensation in your hands and feet.  A complete foot exam to inspect the structure and skin of your feet, including checking for cuts, bruises, redness, blisters, sores, or other problems.  Screening for blood vessel (vascular) problems, which may include checking the pulse in your legs and feet and checking your temperature. Blood tests Depending on your treatment plan and your personal needs, you may have the following tests done:  HbA1c (hemoglobin A1c). This  test provides information about blood sugar (glucose) control over the previous 2-3 months. It is used to adjust your treatment plan, if needed. This test will be done: ? At least 2 times a year, if you are meeting your treatment goals. ? 4 times a year, if you are not meeting your treatment goals or if treatment goals have changed.  Lipid testing, including total, LDL, and HDL cholesterol and triglyceride levels. ? The goal for LDL is less than 100 mg/dL (5.5 mmol/L). If you are at high risk for complications, the goal is less than 70 mg/dL (3.9 mmol/L). ? The goal for HDL is 40 mg/dL (2.2 mmol/L) or higher for men and 50 mg/dL (2.8 mmol/L) or higher for women. An HDL cholesterol of 60 mg/dL (3.3 mmol/L) or higher gives some protection against heart disease. ? The goal for triglycerides is less than 150 mg/dL (8.3 mmol/L).  Liver function tests.  Kidney function tests.  Thyroid function tests. Dental and eye exams  Visit your dentist two times a year.  If you have type 1 diabetes, your health care provider may recommend an eye exam 3-5 years after you are diagnosed, and then once a year after your first exam. ? For children with type 1 diabetes, a health care provider may recommend an eye exam when your child is age 10 or older and has had diabetes for 3-5 years. After the first exam, your child should get an eye exam once a year.  If you have type 2 diabetes, your health care provider may recommend an eye exam as soon as you are diagnosed, and then once a year after your first exam. Immunizations   The   yearly flu (influenza) vaccine is recommended for everyone 6 months or older who has diabetes.  The pneumonia (pneumococcal) vaccine is recommended for everyone 2 years or older who has diabetes. If you are 65 or older, you may get the pneumonia vaccine as a series of two separate shots.  The hepatitis B vaccine is recommended for adults shortly after being diagnosed with diabetes.   Adults and children with diabetes should receive all other vaccines according to age-specific recommendations from the Centers for Disease Control and Prevention (CDC). Mental and emotional health Screening for symptoms of eating disorders, anxiety, and depression is recommended at the time of diagnosis and afterward as needed. If your screening shows that you have symptoms (positive screening result), you may need more evaluation and you may work with a mental health care provider. Treatment plan Your treatment plan will be reviewed at every medical visit. You and your health care provider will discuss:  How you are taking your medicines, including insulin.  Any side effects you are experiencing.  Your blood glucose target goals.  The frequency of your blood glucose monitoring.  Lifestyle habits, such as activity level as well as tobacco, alcohol, and substance use. Diabetes self-management education Your health care provider will assess how well you are monitoring your blood glucose levels and whether you are taking your insulin correctly. He or she may refer you to:  A certified diabetes educator to manage your diabetes throughout your life, starting at diagnosis.  A registered dietitian who can create or review your personal nutrition plan.  An exercise specialist who can discuss your activity level and exercise plan. Summary  Managing diabetes (diabetes mellitus) can be complicated. Your diabetes treatment may be managed by a team of health care providers.  Your health care providers follow guidelines in order to help you get the best quality of care.  Standards of care including having regular physical exams, blood tests, blood pressure monitoring, immunizations, screening tests, and education about how to manage your diabetes.  Your health care providers may also give you more specific instructions based on your individual health. This information is not intended to replace  advice given to you by your health care provider. Make sure you discuss any questions you have with your health care provider. Document Released: 12/15/2008 Document Revised: 11/06/2017 Document Reviewed: 11/16/2015 Elsevier Patient Education  2020 [employer]  

## 2018-10-07 ENCOUNTER — Telehealth: Payer: Self-pay

## 2018-10-07 ENCOUNTER — Other Ambulatory Visit: Payer: Self-pay | Admitting: Family Medicine

## 2018-10-07 LAB — COMPREHENSIVE METABOLIC PANEL
ALT: 8 IU/L (ref 0–32)
AST: 8 IU/L (ref 0–40)
Albumin/Globulin Ratio: 1.7 (ref 1.2–2.2)
Albumin: 4.2 g/dL (ref 3.8–4.8)
Alkaline Phosphatase: 81 IU/L (ref 39–117)
BUN/Creatinine Ratio: 19 (ref 9–23)
BUN: 15 mg/dL (ref 6–24)
Bilirubin Total: <0.2 mg/dL (ref 0.0–1.2)
CO2: 23 mmol/L (ref 20–29)
Calcium: 9.6 mg/dL (ref 8.7–10.2)
Chloride: 103 mmol/L (ref 96–106)
Creatinine, Ser: 0.81 mg/dL (ref 0.57–1.00)
GFR calc Af Amer: 101 mL/min/{1.73_m2} (ref >59)
GFR calc non Af Amer: 88 mL/min/{1.73_m2} (ref >59)
Globulin, Total: 2.5 g/dL (ref 1.5–4.5)
Glucose: 106 mg/dL — ABNORMAL HIGH (ref 65–99)
Potassium: 4 mmol/L (ref 3.5–5.2)
Sodium: 141 mmol/L (ref 134–144)
Total Protein: 6.7 g/dL (ref 6.0–8.5)

## 2018-10-07 LAB — MICROALBUMIN / CREATININE URINE RATIO
Creatinine, Urine: 113 mg/dL
Microalb/Creat Ratio: 6 mg/g creat (ref 0–29)
Microalbumin, Urine: 7 ug/mL

## 2018-10-07 LAB — LIPID PANEL
Chol/HDL Ratio: 3 ratio (ref 0.0–4.4)
Cholesterol, Total: 182 mg/dL (ref 100–199)
HDL: 60 mg/dL (ref >39)
LDL Calculated: 108 mg/dL — ABNORMAL HIGH (ref 0–99)
Triglycerides: 72 mg/dL (ref 0–149)
VLDL Cholesterol Cal: 14 mg/dL (ref 5–40)

## 2018-10-07 LAB — VITAMIN D 25 HYDROXY (VIT D DEFICIENCY, FRACTURES): Vit D, 25-Hydroxy: 14.3 ng/mL — ABNORMAL LOW (ref 30.0–100.0)

## 2018-10-07 MED ORDER — VITAMIN D (ERGOCALCIFEROL) 1.25 MG (50000 UNIT) PO CAPS: 50000.0000 [IU] | ORAL_CAPSULE | ORAL | 0 refills | Status: DC

## 2018-10-07 NOTE — Progress Notes (Signed)
Low Vitamin D. All other labs are stable. I will send Vit D to the pharmacy. After completion of the prescription, patient will need otc vitamin D of 1,000units daily.   

## 2018-10-07 NOTE — Progress Notes (Unsigned)
Vitamin D sent

## 2018-10-07 NOTE — Telephone Encounter (Signed)
Called, no answer. Left a voicemail to return call. Thanks!

## 2018-10-07 NOTE — Telephone Encounter (Signed)
-----   Message from Lanae Boast, [place] sent at 10/07/2018 10:36 AM EDT ----- Low Vitamin D. All other labs are stable. I will send Vit D to the pharmacy. After completion of the prescription, patient will need otc vitamin D of 1,000units daily.

## 2018-10-08 NOTE — Telephone Encounter (Signed)
Called and spoke with patient, advised that labs are stable besides vitamin D which is low. Advised to take vitamin rx once weekly for 8 weeks until completed. Advised after completed to take vitamin d 1000 units daily and we will follow up at next scheduled appointment. Thanks !

## 2018-10-26 ENCOUNTER — Other Ambulatory Visit: Payer: Self-pay | Admitting: Family Medicine

## 2018-10-26 DIAGNOSIS — I10 Essential (primary) hypertension: Secondary | ICD-10-CM

## 2018-10-26 MED FILL — AMLODIPINE BESYLATE 5 MG TA: 5 | 30 days supply | Qty: 30 | Fill #0

## 2018-11-10 ENCOUNTER — Encounter (HOSPITAL_COMMUNITY): Payer: Self-pay

## 2018-11-10 ENCOUNTER — Encounter (HOSPITAL_COMMUNITY): Payer: Self-pay | Admitting: *Deleted

## 2018-11-29 ENCOUNTER — Other Ambulatory Visit: Payer: Self-pay | Admitting: Family Medicine

## 2018-12-13 ENCOUNTER — Telehealth: Payer: Self-pay | Admitting: Internal Medicine

## 2018-12-13 NOTE — Telephone Encounter (Signed)
Called and explained to patient that the metformin she is on is not the extended release form which was recalled. Advised if she still wished to have this changed that she should make an appointment to come in and speak with a provider. She felt reassured and has an appointment on 01/06/2019 and will speak to a provider at that time. Thanks!

## 2018-12-15 ENCOUNTER — Other Ambulatory Visit: Payer: Self-pay

## 2018-12-15 DIAGNOSIS — Z20822 Contact with and (suspected) exposure to covid-19: Secondary | ICD-10-CM

## 2018-12-16 LAB — NOVEL CORONAVIRUS, NAA: SARS-CoV-2, NAA: DETECTED — AB

## 2018-12-22 ENCOUNTER — Telehealth: Payer: Self-pay | Admitting: Internal Medicine

## 2018-12-22 NOTE — Telephone Encounter (Signed)
done

## 2018-12-23 ENCOUNTER — Ambulatory Visit (HOSPITAL_COMMUNITY)
Admission: EM | Admit: 2018-12-23 | Discharge: 2018-12-23 | Disposition: A | Discharge status: Home / Self Care | Payer: HRSA Program | Attending: Family Medicine | Admitting: Family Medicine

## 2018-12-23 ENCOUNTER — Encounter (HOSPITAL_COMMUNITY): Payer: Self-pay

## 2018-12-23 ENCOUNTER — Other Ambulatory Visit: Payer: Self-pay

## 2018-12-23 ENCOUNTER — Ambulatory Visit (INDEPENDENT_AMBULATORY_CARE_PROVIDER_SITE_OTHER): Payer: HRSA Program

## 2018-12-23 DIAGNOSIS — J189 Pneumonia, unspecified organism: Secondary | ICD-10-CM

## 2018-12-23 DIAGNOSIS — J1282 Pneumonia due to coronavirus disease 2019: Secondary | ICD-10-CM

## 2018-12-23 DIAGNOSIS — J1289 Other viral pneumonia: Secondary | ICD-10-CM

## 2018-12-23 DIAGNOSIS — U071 COVID-19: Secondary | ICD-10-CM

## 2018-12-23 MED ORDER — AZITHROMYCIN 250 MG PO TABS: 250.0000 mg | ORAL_TABLET | Freq: Every day | ORAL | 0 refills | Status: DC

## 2018-12-23 MED ORDER — PREDNISONE 10 MG PO TABS: 40.0000 mg | ORAL_TABLET | Freq: Every day | ORAL | 0 refills | Status: AC

## 2018-12-23 NOTE — ED Provider Notes (Signed)
[facility]    CSN: [id number] Arrival date & time: 12/23/18  1239      History   Chief Complaint Chief Complaint  Patient presents with   Fever   Shortness of Breath    HPI Melinda Ingram is a 43 y.o. female.   Patient is a 43 year old female with past medical history of anxiety, arthritis, diabetes, gallstones, hypertension.  She presents today with fever, cough, shortness of breath. She tested positive for COVID on 12/15/2018. Her symptoms started a few days prior to this. She is still having persistent fevers, cough and shortness of breath.  The fever this morning was 102.7.  She is been taking over-the-counter medications and antipyretics as needed.  Here with concerns and requesting chest x-ray.  ROS per HPI    Fever Shortness of Breath Associated symptoms: fever     Past Medical History:  Diagnosis Date   Anxiety    Arthritis    Diabetes mellitus without complication (HCC)    type 2   Dyspnea    walking    Gallstone    Headache    occasional   History of kidney stones    Hypertension    Stopped Lisinopril because of side effects.    Patient Active Problem List   Diagnosis Date Noted   Pain of joint of left ankle and foot 05/28/2016   Elevated blood pressure reading 05/28/2016   Foot erythema 05/28/2016   Essential hypertension 05/28/2016    Past Surgical History:  Procedure Laterality Date   cells frozen     dysplasia   CHOLECYSTECTOMY     01/28/17 Dr. Kae Heller   CHOLECYSTECTOMY N/A 01/28/2017   Procedure: LAPAROSCOPIC CHOLECYSTECTOMY;  Surgeon: Clovis Riley, MD;  Location: [facility];  Service: General;  Laterality: N/A;   TUBAL LIGATION      OB History   No obstetric history on file.      Home Medications    Prior to Admission medications   Medication Sig Start Date End Date Taking? Authorizing Provider  amLODipine (NORVASC) 5 MG tablet TAKE 1 TABLET (5 MG TOTAL) BY MOUTH DAILY. 10/26/18    Lanae Boast, FNP  azithromycin (ZITHROMAX) 250 MG tablet Take 1 tablet (250 mg total) by mouth daily. Take first 2 tablets together, then 1 every day until finished. 12/23/18   Loura Halt A, NP  blood glucose meter kit and supplies KIT Dispense based on patient and insurance preference. Use up to four times daily as directed. (FOR ICD-9 250.00, 250.01). 02/19/18   Lanae Boast, FNP  metFORMIN (GLUCOPHAGE) 500 MG tablet Take 1 tablet (500 mg total) by mouth 2 (two) times daily with a meal. 10/06/18   Lanae Boast, FNP  predniSONE (DELTASONE) 10 MG tablet Take 4 tablets (40 mg total) by mouth daily for 5 days. 12/23/18 12/28/18  Loura Halt A, NP  Vitamin D, Ergocalciferol, (DRISDOL) 1.25 MG (50000 UT) CAPS capsule TAKE 1 CAPSULE (50,000 UNITS TOTAL) BY MOUTH EVERY 7 (SEVEN) DAYS. 11/29/18 01/24/19  Azzie Glatter, FNP    Family History Family History  Problem Relation Age of Onset   Hypertension Mother    Diabetes Mother    Heart failure Mother    Hypertension Father     Social History Social History   Tobacco Use   Smoking status: Never Smoker   Smokeless tobacco: Never Used  Substance Use Topics   Alcohol use: Yes    Comment: occausional   Drug use: No  Allergies   Lisinopril   Review of Systems Review of Systems  Constitutional: Positive for fever.  Respiratory: Positive for shortness of breath.      Physical Exam Triage Vital Signs ED Triage Vitals  Enc Vitals Group     BP 12/23/18 1329 (!) 132/96     Pulse Rate 12/23/18 1329 (!) 108     Resp 12/23/18 1329 (!) 22     Temp 12/23/18 1329 98 F (36.7 C)     Temp Source 12/23/18 1329 Tympanic     SpO2 12/23/18 1329 96 %     Weight 12/23/18 1336 255 lb (115.7 kg)     Height --      Head Circumference --      Peak Flow --      Pain Score 12/23/18 1336 2     Pain Loc --      Pain Edu? --      Excl. in [place]? --    No data found.  Updated Vital Signs BP (!) 132/96 (BP Location: Left Arm)     Pulse (!) 108    Temp 98 F (36.7 C) (Tympanic)    Resp (!) 22    Wt 255 lb (115.7 kg)    LMP 11/28/2018    SpO2 96%    BMI 46.64 kg/m   Visual Acuity Right Eye Distance:   Left Eye Distance:   Bilateral Distance:    Right Eye Near:   Left Eye Near:    Bilateral Near:     Physical Exam Vitals signs and nursing note reviewed.  Constitutional:      General: She is not in acute distress.    Appearance: She is well-developed. She is ill-appearing. She is not toxic-appearing or diaphoretic.  HENT:     Head: Normocephalic and atraumatic.     Mouth/Throat:     Pharynx: Oropharynx is clear.  Eyes:     Conjunctiva/sclera: Conjunctivae normal.  Neck:     Musculoskeletal: Normal range of motion and neck supple.  Cardiovascular:     Rate and Rhythm: Normal rate and regular rhythm.     Heart sounds: No murmur.  Pulmonary:     Effort: Pulmonary effort is normal. No respiratory distress.     Breath sounds: Normal breath sounds.  Abdominal:     Palpations: Abdomen is soft.     Tenderness: There is no abdominal tenderness.  Musculoskeletal: Normal range of motion.  Skin:    General: Skin is warm and dry.  Neurological:     Mental Status: She is alert.  Psychiatric:        Mood and Affect: Mood normal.      UC Treatments / Results  Labs (all labs ordered are listed, but only abnormal results are displayed) Labs Reviewed - No data to display  EKG   Radiology Dg Chest 2 View  Result Date: 12/23/2018 CLINICAL DATA:  Cough, chest pain, sob, and fever x 1 week. Pt is COVID positive. Symptoms are worse. Hx of high blood pressure and diabetes. Nonsmoker EXAM: CHEST - 2 VIEW COMPARISON:  02/17/2005 FINDINGS: Relatively low lung volumes. Prominent interstitial opacities in the lung bases left greater than right. No overt edema. Heart size and mediastinal contours are within normal limits. No effusion. No pneumothorax. Visualized bones unremarkable. Cholecystectomy clips. IMPRESSION:  Low volumes with prominent bibasilar interstitial opacities left greater than right. Electronically Signed   By: Lucrezia Europe M.D.   On: 12/23/2018 14:33    Procedures  Procedures (including critical care time)  Medications Ordered in UC Medications - No data to display  Initial Impression / Assessment and Plan / UC Course  I have reviewed the triage vital signs and the nursing notes.  Pertinent labs & imaging results that were available during my care of the patient were reviewed by me and considered in my medical decision making (see chart for details).     COVID pneumonia- most likely viral Pt still having high temperatures and SOB with cough We will treat with round of prednisone and azithromycin  in case this is bacterial and to see if this will help with her symptoms.  OTC meds as needed Quarantine precautions given  ER precautions given.   Final Clinical Impressions(s) / UC Diagnoses   Final diagnoses:  Pneumonia due to COVID-19 virus     Discharge Instructions     Your x-ray did show bilateral pneumonia  most likely due to Covid.  This is most likely viral but we will try treating you with steroids and antibiotics to see if this helps with your symptoms. Make sure you are still staying quarantined Take the medication as prescribed. Follow up as needed for continued or worsening symptoms For worsening symptoms go to the ER.      ED Prescriptions    Medication Sig Dispense Auth. Provider   predniSONE (DELTASONE) 10 MG tablet Take 4 tablets (40 mg total) by mouth daily for 5 days. 20 tablet Jillaine Waren A, NP   azithromycin (ZITHROMAX) 250 MG tablet Take 1 tablet (250 mg total) by mouth daily. Take first 2 tablets together, then 1 every day until finished. 6 tablet Loura Halt A, NP     PDMP not reviewed this encounter.   Orvan July, NP 12/25/18 [phone]

## 2018-12-23 NOTE — ED Triage Notes (Signed)
Pt states she is positive for Covid. Pt states she has SOB and has a fever. Pt has been this way for more then a week.

## 2018-12-23 NOTE — Discharge Instructions (Addendum)
Your x-ray did show bilateral pneumonia  most likely due to Covid.  This is most likely viral but we will try treating you with steroids and antibiotics to see if this helps with your symptoms. Make sure you are still staying quarantined Take the medication as prescribed. Follow up as needed for continued or worsening symptoms For worsening symptoms go to the ER.

## 2018-12-31 ENCOUNTER — Other Ambulatory Visit: Payer: Self-pay

## 2018-12-31 DIAGNOSIS — Z20822 Contact with and (suspected) exposure to covid-19: Secondary | ICD-10-CM

## 2019-01-01 LAB — NOVEL CORONAVIRUS, NAA: SARS-CoV-2, NAA: NOT DETECTED

## 2019-01-06 ENCOUNTER — Ambulatory Visit: Payer: PRIVATE HEALTH INSURANCE | Admitting: Family Medicine

## 2019-01-16 ENCOUNTER — Other Ambulatory Visit: Payer: Self-pay | Admitting: Family Medicine

## 2019-01-26 ENCOUNTER — Other Ambulatory Visit: Payer: Self-pay | Admitting: Family Medicine

## 2019-01-26 DIAGNOSIS — I10 Essential (primary) hypertension: Secondary | ICD-10-CM

## 2019-02-15 ENCOUNTER — Ambulatory Visit (INDEPENDENT_AMBULATORY_CARE_PROVIDER_SITE_OTHER): Payer: Self-pay | Admitting: Family Medicine

## 2019-02-15 ENCOUNTER — Other Ambulatory Visit: Payer: Self-pay

## 2019-02-15 ENCOUNTER — Encounter: Payer: Self-pay | Admitting: Family Medicine

## 2019-02-15 VITALS — BP 141/80 | HR 101 | Temp 98.2°F | Resp 16 | Ht 68.0 in | Wt 262.0 lb

## 2019-02-15 DIAGNOSIS — I10 Essential (primary) hypertension: Secondary | ICD-10-CM

## 2019-02-15 DIAGNOSIS — E119 Type 2 diabetes mellitus without complications: Secondary | ICD-10-CM

## 2019-02-15 DIAGNOSIS — Z23 Encounter for immunization: Secondary | ICD-10-CM

## 2019-02-15 DIAGNOSIS — R635 Abnormal weight gain: Secondary | ICD-10-CM

## 2019-02-15 LAB — POCT URINALYSIS DIPSTICK
Bilirubin, UA: NEGATIVE
Glucose, UA: NEGATIVE
Ketones, UA: NEGATIVE
Leukocytes, UA: NEGATIVE
Nitrite, UA: NEGATIVE
Protein, UA: POSITIVE — AB
Spec Grav, UA: >=1.03 — AB (ref 1.010–1.025)
Urobilinogen, UA: 0.2 E.U./dL
pH, UA: 5 (ref 5.0–8.0)

## 2019-02-15 LAB — POCT GLYCOSYLATED HEMOGLOBIN (HGB A1C): Hemoglobin A1C: 6.3 % — AB (ref 4.0–5.6)

## 2019-02-15 NOTE — Patient Instructions (Signed)
Cut down on soda, cut carbs 5-6 small meals throughout the day  64 ounces of water per day  Your hemoglobin a1C is at goal at 6.3, no medication changes.

## 2019-02-15 NOTE — Progress Notes (Signed)
Patient [name]   Established Patient Office Visit  Subjective:  Patient ID: Melinda Ingram, female    DOB: 01/31/1976  Age: 43 y.o. MRN: [id number]  CC:  Chief Complaint  Patient presents with  . Hypertension  . Diabetes   Melinda Ingram is a very pleasant 43 year old female with a medical history significant for type 2 DM, hypertension and morbid obesity presents for follow up of chronic conditions. Patient says that she has not been following a carbohydrate modified diet or exercising consistently. She says that she has been working 2 jobs and does not have time to prepare healthy meals or exercise. She also says that she gets between 3 and 4 hours of sleep per night, so she never feels rested. She endorses fatigued.   Hypertension This is a chronic problem. The current episode started more than 1 year ago. The problem has been gradually improving since onset. The problem is controlled. Pertinent negatives include no anxiety, blurred vision, headaches, palpitations, peripheral edema or sweats. Risk factors for coronary artery disease include diabetes mellitus and obesity. The current treatment provides moderate improvement. Compliance problems include diet and exercise.   Diabetes She presents for her follow-up diabetic visit. She has type 2 diabetes mellitus. Her disease course has been stable. Pertinent negatives for hypoglycemia include no confusion, dizziness, headaches or sweats. Pertinent negatives for diabetes include no blurred vision, no polydipsia, no polyphagia and no polyuria. She is compliant with treatment some of the time. She is following a generally unhealthy diet. She has not had a previous visit with a dietitian. She does not see a podiatrist.Eye exam is not current.    Past Medical History:  Diagnosis Date  . Anxiety   . Arthritis   . Diabetes mellitus without complication ([place])    type 2  . Dyspnea    walking   .  Gallstone   . Headache    occasional  . History of kidney stones   . Hypertension    Stopped Lisinopril because of side effects.    Past Surgical History:  Procedure Laterality Date  . cells frozen     dysplasia  . CHOLECYSTECTOMY     01/28/17 Dr. Kae Heller  . CHOLECYSTECTOMY N/A 01/28/2017   Procedure: LAPAROSCOPIC CHOLECYSTECTOMY;  Surgeon: Clovis Riley, MD;  Location: [facility];  Service: General;  Laterality: N/A;  . TUBAL LIGATION      Family History  Problem Relation Age of Onset  . Hypertension Mother   . Diabetes Mother   . Heart failure Mother   . Hypertension Father     Social History   Socioeconomic History  . Marital status: Divorced    Spouse name: Not on file  . Number of children: Not on file  . Years of education: Not on file  . Highest education level: Not on file  Occupational History  . Not on file  Tobacco Use  . Smoking status: Never Smoker  . Smokeless tobacco: Never Used  Substance and Sexual Activity  . Alcohol use: Yes    Comment: occausional  . Drug use: No  . Sexual activity: Yes    Birth control/protection: Surgical  Other Topics Concern  . Not on file  Social History Narrative  . Not on file   Social Determinants of Health   Financial Resource Strain:   . Difficulty of Paying Living Expenses: Not on file  Food Insecurity:   . Worried About Running  Out of Food in the Last Year: Not on file  . Ran Out of Food in the Last Year: Not on file  Transportation Needs:   . Lack of Transportation (Medical): Not on file  . Lack of Transportation (Non-Medical): Not on file  Physical Activity:   . Days of Exercise per Week: Not on file  . Minutes of Exercise per Session: Not on file  Stress:   . Feeling of Stress : Not on file  Social Connections:   . Frequency of Communication with Friends and Family: Not on file  . Frequency of Social Gatherings with Friends and Family: Not on file  . Attends Religious Services: Not on file  .  Active Member of Clubs or Organizations: Not on file  . Attends Archivist Meetings: Not on file  . Marital Status: Not on file  Intimate Partner Violence:   . Fear of Current or Ex-Partner: Not on file  . Emotionally Abused: Not on file  . Physically Abused: Not on file  . Sexually Abused: Not on file    Outpatient Medications Prior to Visit  Medication Sig Dispense Refill  . amLODipine (NORVASC) 5 MG tablet TAKE 1 TABLET (5 MG TOTAL) BY MOUTH DAILY. 30 tablet 2  . blood glucose meter kit and supplies KIT Dispense based on patient and insurance preference. Use up to four times daily as directed. (FOR ICD-9 250.00, 250.01). 1 each 0  . metFORMIN (GLUCOPHAGE) 500 MG tablet Take 1 tablet (500 mg total) by mouth 2 (two) times daily with a meal. 180 tablet 2  . Vitamin D, Ergocalciferol, (DRISDOL) 1.25 MG (50000 UT) CAPS capsule TAKE 1 CAPSULE (50,000 UNITS TOTAL) BY MOUTH EVERY 7 (SEVEN) DAYS. 8 capsule 0  . azithromycin (ZITHROMAX) 250 MG tablet Take 1 tablet (250 mg total) by mouth daily. Take first 2 tablets together, then 1 every day until finished. 6 tablet 0   No facility-administered medications prior to visit.    Allergies  Allergen Reactions  . Lisinopril Cough    ROS Review of Systems  Constitutional: Negative for activity change and appetite change.  HENT: Negative.   Eyes: Positive for visual disturbance. Negative for blurred vision.  Respiratory: Negative.  Negative for choking and chest tightness.   Cardiovascular: Negative for palpitations.  Endocrine: Negative for cold intolerance, heat intolerance, polydipsia, polyphagia and polyuria.  Genitourinary: Negative.   Musculoskeletal: Negative.  Negative for arthralgias and back pain.  Skin: Negative.   Allergic/Immunologic: Negative.   Neurological: Negative.  Negative for dizziness and headaches.  Hematological: Negative.   Psychiatric/Behavioral: Negative for confusion.      Objective:    Physical  Exam  Constitutional: She is oriented to person, place, and time. She appears well-developed and well-nourished.  Morbid obesity  HENT:  Head: Normocephalic and atraumatic.  Eyes: Pupils are equal, round, and reactive to light.  Cardiovascular: Normal rate and regular rhythm.  Pulmonary/Chest: Effort normal.  Abdominal: Bowel sounds are normal.  Musculoskeletal:        General: Normal range of motion.     Cervical back: Normal range of motion.  Neurological: She is alert and oriented to person, place, and time.  Skin: Skin is warm and dry.  Psychiatric: She has a normal mood and affect. Her behavior is normal. Judgment and thought content normal.    BP (!) 141/80 (BP Location: Left Arm, Patient Position: Sitting, Cuff Size: Large)   Pulse (!) 101   Temp 98.2 F (36.8 C) (Oral)  Resp 16   Ht _0  (1.727 m)   Wt 262 lb (118.8 kg)   LMP 02/13/2019   SpO2 100%   BMI 39.84 kg/m  Wt Readings from Last 3 Encounters:  02/15/19 262 lb (118.8 kg)  12/23/18 255 lb (115.7 kg)  10/06/18 258 lb (117 kg)     Health Maintenance Due  Topic Date Due  . OPHTHALMOLOGY EXAM  03/05/1983    There are no preventive care reminders to display for this patient.  Lab Results  Component Value Date   TSH 2.35 06/05/2016   Lab Results  Component Value Date   WBC 6.3 01/27/2017   HGB 11.6 (L) 01/27/2017   HCT 36.3 01/27/2017   MCV 73.6 (L) 01/27/2017   PLT 305 01/27/2017   Lab Results  Component Value Date   NA 141 10/06/2018   K 4.0 10/06/2018   CO2 23 10/06/2018   GLUCOSE 106 (H) 10/06/2018   BUN 15 10/06/2018   CREATININE 0.81 10/06/2018   BILITOT <0.2 10/06/2018   ALKPHOS 81 10/06/2018   AST 8 10/06/2018   ALT 8 10/06/2018   PROT 6.7 10/06/2018   ALBUMIN 4.2 10/06/2018   CALCIUM 9.6 10/06/2018   ANIONGAP 11 12/30/2016   Lab Results  Component Value Date   CHOL 182 10/06/2018   Lab Results  Component Value Date   HDL 60 10/06/2018   Lab Results  Component Value  Date   LDLCALC 108 (H) 10/06/2018   Lab Results  Component Value Date   TRIG 72 10/06/2018   Lab Results  Component Value Date   CHOLHDL 3.0 10/06/2018   Lab Results  Component Value Date   HGBA1C 6.3 (A) 02/15/2019      Assessment & Plan:   Problem List Items Addressed This Visit      Cardiovascular and Mediastinum   Essential hypertension   Relevant Orders   Urinalysis Dipstick (Completed)    Other Visit Diagnoses    Type 2 diabetes mellitus without complication, without long-term current use of insulin ([place])    -  Primary   Relevant Orders   HgB A1c (Completed)     1. Type 2 diabetes mellitus without complication, without long-term current use of insulin (HCC) Hemoglobin a1C is at goal on current medication regimen, no changes warranted at this time.  Patient's A1C goal is less than 7. Fasting blood sugar  Upon awakening goal is between 110-140.  LDL  (bad cholesterol goal is less than 100, check cholesterol panel in 6 months Blood pressure goal is <140/90.  Recommend a lowfat, low carbohydrate diet divided over 5-6 small meals, increase water intake to 6-8 glasses, and 150 minutes per week of cardiovascular exercise.   Encouraged to take medications as prescribed Make sure that you are familiar with each one of your medications and what they are used to treat.  If you are unsure of medications, please bring to follow up Will send referral for eye exam  Please keep your scheduled follow up appointment.   - HgB Z6X - Basic Metabolic Panel  2. Essential hypertension Continue medication, monitor blood pressure at home.  Reminder to go to the ER if any CP, SOB, nausea, dizziness, severe HA, changes vision/speech, left arm numbness and tingling and jaw pain.   - Urinalysis Dipstick - Basic Metabolic Panel  3. Weight gain The patient is asked to make an attempt to improve diet and exercise patterns to aid in medical management of this problem.  Will follow  up by  phone with lab results.  - TSH  Follow-up: 3 months for chronic conditions   Christoffer Currier Al Decant  APRN, MSN, FNP-C Patient [facility] [address] [place], [place] [address] [phone]

## 2019-02-16 ENCOUNTER — Telehealth: Payer: Self-pay

## 2019-02-16 LAB — BASIC METABOLIC PANEL
BUN/Creatinine Ratio: 14 (ref 9–23)
BUN: 12 mg/dL (ref 6–24)
CO2: 24 mmol/L (ref 20–29)
Calcium: 9.5 mg/dL (ref 8.7–10.2)
Chloride: 103 mmol/L (ref 96–106)
Creatinine, Ser: 0.87 mg/dL (ref 0.57–1.00)
GFR calc Af Amer: 93 mL/min/{1.73_m2} (ref >59)
GFR calc non Af Amer: 81 mL/min/{1.73_m2} (ref >59)
Glucose: 133 mg/dL — ABNORMAL HIGH (ref 65–99)
Potassium: 4 mmol/L (ref 3.5–5.2)
Sodium: 141 mmol/L (ref 134–144)

## 2019-02-16 LAB — TSH: TSH: 1.36 u[IU]/mL (ref 0.450–4.500)

## 2019-02-16 NOTE — Telephone Encounter (Signed)
Called and spoke with patient, advised that creatinine level is within normal range and all other labs are unremarkable. Reminded patient that it is important to work on loosing weight, 5 % over 3 months and working on diet and exercise. Patient verbalized understanding. Thanks!

## 2019-02-16 NOTE — Telephone Encounter (Signed)
-----   Message from Dorena Dew, [place] sent at 02/16/2019  2:38 PM EST ----- Regarding: lab results Please inform patient that creatinine level, which is an indicator of kidney functioning is within normal range.  All other laboratory values are unremarkable.  Remind patient of the importance of following a carbohydrate modified diet as discussed during her appointment on yesterday.  Also, remind her that our goal is to decrease weight by 5% over the next 3 months.  Her A1c remains at goal.  Also low impact exercise around 150 minutes/week is necessary.  Also, remind her that we discussed the importance of getting enough rest.  [name]  APRN, MSN, FNP-C Patient [facility] [address] [place], [place] [address] [phone]

## 2019-03-13 ENCOUNTER — Other Ambulatory Visit: Payer: Self-pay | Admitting: Family Medicine

## 2019-08-16 ENCOUNTER — Other Ambulatory Visit: Payer: Self-pay | Admitting: Family Medicine

## 2019-08-16 ENCOUNTER — Encounter: Payer: Self-pay | Admitting: Family Medicine

## 2019-08-16 ENCOUNTER — Ambulatory Visit (INDEPENDENT_AMBULATORY_CARE_PROVIDER_SITE_OTHER): Payer: Self-pay | Admitting: Family Medicine

## 2019-08-16 ENCOUNTER — Other Ambulatory Visit: Payer: Self-pay

## 2019-08-16 VITALS — BP 164/82 | HR 91 | Temp 97.9°F | Ht 62.0 in | Wt 262.6 lb

## 2019-08-16 DIAGNOSIS — E119 Type 2 diabetes mellitus without complications: Secondary | ICD-10-CM

## 2019-08-16 DIAGNOSIS — R0683 Snoring: Secondary | ICD-10-CM

## 2019-08-16 DIAGNOSIS — I10 Essential (primary) hypertension: Secondary | ICD-10-CM

## 2019-08-16 DIAGNOSIS — Z9189 Other specified personal risk factors, not elsewhere classified: Secondary | ICD-10-CM

## 2019-08-16 DIAGNOSIS — R400 Somnolence: Secondary | ICD-10-CM

## 2019-08-16 LAB — POCT URINALYSIS DIPSTICK
Bilirubin, UA: NEGATIVE
Blood, UA: NEGATIVE
Glucose, UA: NEGATIVE
Ketones, UA: NEGATIVE
Leukocytes, UA: NEGATIVE
Nitrite, UA: NEGATIVE
Protein, UA: NEGATIVE
Spec Grav, UA: >=1.03 — AB (ref 1.010–1.025)
Urobilinogen, UA: 0.2 E.U./dL
pH, UA: 5 (ref 5.0–8.0)

## 2019-08-16 LAB — POCT GLYCOSYLATED HEMOGLOBIN (HGB A1C)
HbA1c POC (<> result, manual entry): 6.3 % (ref 4.0–5.6)
HbA1c, POC (controlled diabetic range): 6.3 % (ref 0.0–7.0)
HbA1c, POC (prediabetic range): 6.3 % (ref 5.7–6.4)
Hemoglobin A1C: 6.3 % — AB (ref 4.0–5.6)

## 2019-08-16 MED ORDER — METFORMIN HCL 500 MG PO TABS: 500.0000 mg | ORAL_TABLET | Freq: Two times a day (BID) | ORAL | 5 refills | Status: DC

## 2019-08-16 MED ORDER — HYDROCHLOROTHIAZIDE 12.5 MG PO TABS: 12.5000 mg | ORAL_TABLET | Freq: Every day | ORAL | 5 refills | Status: DC

## 2019-08-16 MED FILL — HYDROCHLOROTHIAZIDE 12.5 MG: 12.5 | 30 days supply | Qty: 30 | Fill #0

## 2019-08-16 NOTE — Progress Notes (Signed)
Patient [facility]   Established Patient Office Visit  Subjective:  Patient ID: Melinda Ingram, Ingram    DOB: [birth year]  Age: 44 y.o. MRN: [id number]  CC:  Chief Complaint  Patient presents with   Follow-up    having sob , hx of covid, swelling feet and ankle x 12moh     HPI Melinda Ingram with a medical history significant for type 2 diabetes mellitus and hypertension.  Patient says that she has not been following a low-fat, low-sodium diet.  She has been taking all medications consistently.  She endorses some swelling to her feet, primarily at the end of her workday. Patient states that she had COVID-19 infection several weeks ago and continues to have some shortness of breath.  Shortness of breath is primarily with exertion.  Patient does not have a history of asthma.  She denies fever, chills, headache, chest pain, orthopnea, urinary symptoms, nausea, vomiting, or diarrhea.  Diabetes She presents for her follow-up diabetic visit. She has type 2 diabetes mellitus. Pertinent negatives for hypoglycemia include no sweats. Pertinent negatives for diabetes include no blurred vision, no chest pain, no fatigue, no polydipsia, no polyphagia, no polyuria, no visual change, no weakness and no weight loss. Symptoms are stable. Current diabetic treatment includes oral agent (monotherapy). She is compliant with treatment most of the time. She has not had a previous visit with a dietitian. She rarely participates in exercise. An ACE inhibitor/angiotensin II receptor blocker is not being taken. She does not see a podiatrist.Eye exam is not current.  Hypertension This is a chronic problem. Associated symptoms include peripheral edema. Pertinent negatives include no blurred vision, chest pain, neck pain, orthopnea, palpitations, PND, shortness of breath or sweats. Risk factors for coronary artery disease include obesity  and sedentary lifestyle. Compliance problems include diet and exercise.      Past Medical History:  Diagnosis Date   Anxiety    Arthritis    Di[place]    type 2   Dyspnea    walking    Gallstone    Headache    occasional   History of kidney stones    Hypertension    Stopped Lisinopril because of side effects.    Past Surgical History:  Procedure Laterality Date   cells frozen     dysplasia   CHOLECYSTECTOMY     01/28/17 Dr. CKae Heller  CHOLECYSTECTOMY N/A 01/28/2017   Procedure: LAPAROSCOPIC CHOLECYSTECTOMY;  Surgeon: CClovis Riley MD;  Location: [facility];  Service: General;  Laterality: N/A;   TUBAL LIGATION      Family History  Problem Relation Age of Onset   Hypertension Mother    Diabetes Mother    Heart failure Mother    Hypertension Father     Social History   Socioeconomic History   Marital status: Divorced    Spouse name: Not on file   Number of children: Not on file   Years of education: Not on file   Highest education level: Not on file  Occupational History   Not on file  Tobacco Use   Smoking status: Never Smoker   Smokeless tobacco: Never Used  Vaping Use   Vaping Use: Never used  Substance and Sexual Activity   Alcohol use: Yes    Comment: occausional   Drug use: No   Sexual activity: Yes    Birth control/protection: Surgical  Other Topics Concern   Not on file  Social History Narrative   Not on file   Social Determinants of Health   Financial Resource Strain:    Difficulty of Paying Living Expenses:   Food Insecurity:    Worried About Charity fundraiser in the Last Year:    Arboriculturist in the Last Year:   Transportation Needs:    Film/video editor (Medical):    Lack of Transportation (Non-Medical):   Physical Activity:    Days of Exercise per Week:    Minutes of Exercise per Session:   Stress:    Feeling of Stress :   Social Connections:     Frequency of Communication with Friends and Family:    Frequency of Social Gatherings with Friends and Family:    Attends Religious Services:    Active Member of Clubs or Organizations:    Attends Music therapist:    Marital Status:   Intimate Partner Violence:    Fear of Current or Ex-Partner:    Emotionally Abused:    Physically Abused:    Sexually Abused:     Outpatient Medications Prior to Visit  Medication Sig Dispense Refill   blood glucose meter kit and supplies KIT Dispense based on patient and insurance preference. Use up to four times daily as directed. (FOR ICD-9 250.00, 250.01). 1 each 0   amLODipine (NORVASC) 5 MG tablet TAKE 1 TABLET (5 MG TOTAL) BY MOUTH DAILY. 30 tablet 2   metFORMIN (GLUCOPHAGE) 500 MG tablet Take 1 tablet (500 mg total) by mouth 2 (two) times daily with a meal. 180 tablet 2   No facility-administered medications prior to visit.    Allergies  Allergen Reactions   Lisinopril Cough    ROS Review of Systems  Constitutional: Negative for fatigue and weight loss.  Eyes: Negative for blurred vision.  Respiratory: Negative for shortness of breath.   Cardiovascular: Negative for chest pain, palpitations, orthopnea and PND.  Endocrine: Negative for polydipsia, polyphagia and polyuria.  Genitourinary: Negative.   Musculoskeletal: Negative for neck pain.  Neurological: Negative.  Negative for weakness.  Hematological: Negative.   Psychiatric/Behavioral: Negative.       Objective:    Physical Exam Constitutional:      Appearance: She is obese.  Eyes:     Pupils: Pupils are equal, round, and reactive to light.  Cardiovascular:     Rate and Rhythm: Normal rate and regular rhythm.     Comments: Mild edema to lower extremities Pulmonary:     Effort: Pulmonary effort is normal.  Abdominal:     General: Bowel sounds are normal.     Palpations: Abdomen is soft.  Neurological:     General: No focal deficit present.      Mental Status: Mental status is at baseline.  Psychiatric:        Mood and Affect: Mood normal.        Thought Content: Thought content normal.        Judgment: Judgment normal.     BP (!) 164/82 (BP Location: Right Arm, Patient Position: Sitting, Cuff Size: Large)    Pulse 91    Temp 97.9 F (36.6 C) (Temporal)    Ht 5' 2" (1.575 m)    Wt 262 lb 9.6 oz (119.1 kg)    LMP 07/24/2019    SpO2 94%    BMI 48.03 kg/m  Wt Readings from Last 3 Encounters:  08/16/19 262 lb 9.6 oz (  119.1 kg)  02/15/19 262 lb (118.8 kg)  12/23/18 255 lb (115.7 kg)     Health Maintenance Due  Topic Date Due   Hepatitis C Screening  Never done   OPHTHALMOLOGY EXAM  Never done   COVID-19 Vaccine (1) Never done   PAP SMEAR-Modifier  08/08/2019   URINE MICROALBUMIN  10/06/2019    There are no preventive care reminders to display for this patient.  Lab Results  Component Value Date   TSH 1.360 02/15/2019   Lab Results  Component Value Date   WBC 6.3 01/27/2017   HGB 11.6 (L) 01/27/2017   HCT 36.3 01/27/2017   MCV 73.6 (L) 01/27/2017   PLT 305 01/27/2017   Lab Results  Component Value Date   NA 141 02/15/2019   K 4.0 02/15/2019   CO2 24 02/15/2019   GLUCOSE 133 (H) 02/15/2019   BUN 12 02/15/2019   CREATININE 0.87 02/15/2019   BILITOT <0.2 10/06/2018   ALKPHOS 81 10/06/2018   AST 8 10/06/2018   ALT 8 10/06/2018   PROT 6.7 10/06/2018   ALBUMIN 4.2 10/06/2018   CALCIUM 9.5 02/15/2019   ANIONGAP 11 12/30/2016   Lab Results  Component Value Date   CHOL 182 10/06/2018   Lab Results  Component Value Date   HDL 60 10/06/2018   Lab Results  Component Value Date   LDLCALC 108 (H) 10/06/2018   Lab Results  Component Value Date   TRIG 72 10/06/2018   Lab Results  Component Value Date   CHOLHDL 3.0 10/06/2018   Lab Results  Component Value Date   HGBA1C 6.3 (A) 08/16/2019   HGBA1C 6.3 08/16/2019   HGBA1C 6.3 08/16/2019   HGBA1C 6.3 08/16/2019      Assessment & Plan:    Problem List Items Addressed This Visit      Cardiovascular and Mediastinum   Essential hypertension   Relevant Medications   hydrochlorothiazide (HYDRODIURIL) 12.5 MG tablet    Other Visit Diagnoses    Type 2 diabetes mellitus without complication, without long-term current use of insulin (HCC)    -  Primary   Relevant Medications   metFORMIN (GLUCOPHAGE) 500 MG tablet   Other Relevant Orders   HgB A1c (Completed)   Urinalysis Dipstick   Basic Metabolic Panel   At risk for apnea       Relevant Orders   Split night study   Loud snoring       Relevant Orders   Split night study   Daytime sleepiness       Relevant Orders   Split night study      Meds ordered this encounter  Medications   hydrochlorothiazide (HYDRODIURIL) 12.5 MG tablet    Sig: Take 1 tablet (12.5 mg total) by mouth daily.    Dispense:  30 tablet    Refill:  5    Order Specific Question:   Supervising Provider    Answer:   Tresa Garter [[id number]]   metFORMIN (GLUCOPHAGE) 500 MG tablet    Sig: Take 1 tablet (500 mg total) by mouth 2 (two) times daily with a meal.    Dispense:  60 tablet    Refill:  5    Order Specific Question:   Supervising Provider    Answer:   Tresa Garter [[id number]]   1. Type 2 diabetes mellitus without complication, without long-term current use of insulin (HCC) - HgB A1c - Urinalysis Dipstick - Basic Metabolic Panel - metFORMIN (GLUCOPHAGE) 500 MG tablet;  Take 1 tablet (500 mg total) by mouth 2 (two) times daily with a meal.  Dispense: 60 tablet; Refill: 5 - Microalbumin/Creatinine Ratio, Urine  2. At risk for apnea - Split night study; Future  3. Loud snoring - Split night study; Future  4. Daytime sleepiness - Split night study; Future  5. Essential hypertension Patient having consistent lower extremity edema.  Discontinue amlodipine.  We will start a trial of hydrochlorothiazide 12.5 mg daily.  Also, patient counseled at length about low-fat,  low-sodium diet - hydrochlorothiazide (HYDRODIURIL) 12.5 MG tablet; Take 1 tablet (12.5 mg total) by mouth daily.  Dispense: 30 tablet; Refill: 5 patient will return for blood pressure check in 1 week.    Follow-up: Return in about 6 months (around 02/15/2020) for hypertension, diabetes.    [name]  APRN, MSN, FNP-C Patient [facility] [address] [place], [place] [address] [phone]

## 2019-08-16 NOTE — Patient Instructions (Signed)
HCTZ 12.5 mg daily.    Hydrochlorothiazide, HCTZ Oral Capsules or Tablets What is this medicine? HYDROCHLOROTHIAZIDE (hye droe klor oh THYE a zide) is a diuretic. It helps you make more urine and to lose salt and excess water from your body. It treats swelling from heart, kidney, or liver disease. It also treats high blood pressure. This medicine may be used for other purposes; ask your health care provider or pharmacist if you have questions. COMMON BRAND NAME(S): Esidrix, Ezide, HydroDIURIL, Microzide, Oretic, Zide What should I tell my health care provider before I take this medicine? They need to know if you have any of these conditions:  diabetes  gout  immune system problems, like lupus  kidney disease or kidney stones  liver disease  pancreatitis  small amount of urine or difficulty passing urine  an unusual or allergic reaction to hydrochlorothiazide, sulfa drugs, other medicines, foods, dyes, or preservatives  pregnant or trying to get pregnant  breast-feeding How should I use this medicine? Take this drug by mouth. Take it as directed on the prescription label at the same time every day. You can take it with or without food. If it upsets your stomach, take it with food. Keep taking it unless your health care provider tells you to stop. Talk to your health care provider about the use of this drug in children. While it may be prescribed for children as young as newborns for selected conditions, precautions do apply. Overdosage: If you think you have taken too much of this medicine contact a poison control center or emergency room at once. NOTE: This medicine is only for you. Do not share this medicine with others. What if I miss a dose? If you miss a dose, take it as soon as you can. If it is almost time for your next dose, take only that dose. Do not take double or extra doses. What may interact with this  medicine?  cholestyramine  colestipol  digoxin  dofetilide  lithium  medicines for blood pressure  medicines for diabetes  medicines that relax muscles for surgery  other diuretics  steroid medicines like prednisone or cortisone This list may not describe all possible interactions. Give your health care provider a list of all the medicines, herbs, non-prescription drugs, or dietary supplements you use. Also tell them if you smoke, drink alcohol, or use illegal drugs. Some items may interact with your medicine. What should I watch for while using this medicine? Visit your doctor or health care professional for regular checks on your progress. Check your blood pressure as directed. Ask your doctor or health care professional what your blood pressure should be and when you should contact him or her. Talk to your health care professional about your risk of skin cancer. You may be more at risk for skin cancer if you take this medicine. This medicine can make you more sensitive to the sun. Keep out of the sun. If you cannot avoid being in the sun, wear protective clothing and use sunscreen. Do not use sun lamps or tanning beds/booths. You may need to be on a special diet while taking this medicine. Ask your doctor. Check with your doctor or health care professional if you get an attack of severe diarrhea, nausea and vomiting, or if you sweat a lot. The loss of too much body fluid can make it dangerous for you to take this medicine. You may get drowsy or dizzy. Do not drive, use machinery, or do anything that needs  mental alertness until you know how this medicine affects you. Do not stand or sit up quickly, especially if you are an older patient. This reduces the risk of dizzy or fainting spells. Alcohol may interfere with the effect of this medicine. Avoid alcoholic drinks. This medicine may increase blood sugar. Ask your healthcare provider if changes in diet or medicines are needed if you  have diabetes. What side effects may I notice from receiving this medicine? Side effects that you should report to your doctor or health care professional as soon as possible:  allergic reactions such as skin rash or itching, hives, swelling of the lips, mouth, tongue, or throat  changes in vision  chest pain  eye pain  fast or irregular heartbeat  feeling faint or lightheaded, falls  gout attack  muscle pain or cramps  pain or difficulty when passing urine  pain, tingling, numbness in the hands or feet  redness, blistering, peeling or loosening of the skin, including inside the mouth   signs and symptoms of high blood sugar such as being more thirsty or hungry or having to urinate more than normal. You may also feel very tired or have blurry vision.  unusually weak Side effects that usually do not require medical attention (report to your doctor or health care professional if they continue or are bothersome):  change in sex drive or performance  dry mouth  headache  stomach upset This list may not describe all possible side effects. Call your doctor for medical advice about side effects. You may report side effects to FDA at 1-800-FDA-1088. Where should I keep my medicine? Keep out of the reach of children and pets. Store at room temperature between 20 and 25 degrees C (68 and 77 degrees F). Protect from light and moisture. Keep the container tightly closed. Do not freeze. Throw away any unused drug after the expiration date. NOTE: This sheet is a summary. It may not cover all possible information. If you have questions about this medicine, talk to your doctor, pharmacist, or health care provider.  2020 Elsevier/Gold Standard (2018-10-21 16:52:59)

## 2019-08-17 LAB — BASIC METABOLIC PANEL
BUN/Creatinine Ratio: 10 (ref 9–23)
BUN: 11 mg/dL (ref 6–24)
CO2: 22 mmol/L (ref 20–29)
Calcium: 9.6 mg/dL (ref 8.7–10.2)
Chloride: 105 mmol/L (ref 96–106)
Creatinine, Ser: 1.05 mg/dL — ABNORMAL HIGH (ref 0.57–1.00)
GFR calc Af Amer: 74 mL/min/{1.73_m2} (ref >59)
GFR calc non Af Amer: 64 mL/min/{1.73_m2} (ref >59)
Glucose: 132 mg/dL — ABNORMAL HIGH (ref 65–99)
Potassium: 3.8 mmol/L (ref 3.5–5.2)
Sodium: 142 mmol/L (ref 134–144)

## 2019-08-17 LAB — MICROALBUMIN / CREATININE URINE RATIO
Creatinine, Urine: 97.1 mg/dL
Microalb/Creat Ratio: 4 mg/g creat (ref 0–29)
Microalbumin, Urine: 4.1 ug/mL

## 2019-08-23 ENCOUNTER — Other Ambulatory Visit: Payer: Self-pay

## 2019-08-23 ENCOUNTER — Ambulatory Visit: Payer: Self-pay | Admitting: Nurse Practitioner

## 2019-08-23 VITALS — BP 147/78 | HR 74

## 2019-08-23 DIAGNOSIS — I10 Essential (primary) hypertension: Secondary | ICD-10-CM

## 2019-08-23 NOTE — Progress Notes (Signed)
Pt came in today for blood pressure check, no other concerns. Blood pressure 147/78 pulse 74

## 2019-10-08 ENCOUNTER — Other Ambulatory Visit: Payer: Self-pay

## 2019-10-08 ENCOUNTER — Ambulatory Visit (HOSPITAL_BASED_OUTPATIENT_CLINIC_OR_DEPARTMENT_OTHER): Payer: Self-pay | Attending: Family Medicine | Admitting: Internal Medicine

## 2019-10-08 VITALS — Ht 62.0 in | Wt 260.0 lb

## 2019-10-08 DIAGNOSIS — Z9189 Other specified personal risk factors, not elsewhere classified: Secondary | ICD-10-CM | POA: Insufficient documentation

## 2019-10-08 DIAGNOSIS — R400 Somnolence: Secondary | ICD-10-CM | POA: Insufficient documentation

## 2019-10-08 DIAGNOSIS — R0683 Snoring: Secondary | ICD-10-CM | POA: Insufficient documentation

## 2019-10-16 DIAGNOSIS — Z9189 Other specified personal risk factors, not elsewhere classified: Secondary | ICD-10-CM

## 2019-10-16 NOTE — Procedures (Signed)
   Patient Name: Melinda Ingram, Araki Study Date: 10/08/2019 Gender: Female D.O.B: [birth year] Age (years): 44 Referring Provider: Julianne Handler Height (inches): 62 Interpreting Physician: Jetty Duhamel MD, ABSM Weight (lbs): 260 RPSGT: Rolene Arbour BMI: 48 MRN: [id number] Neck Size: 17.00  CLINICAL INFORMATION Sleep Study Type: NPSG Indication for sleep study: OSA Epworth Sleepiness Score: 4  SLEEP STUDY TECHNIQUE As per the AASM Manual for the Scoring of Sleep and Associated Events v2.3 (April 2016) with a hypopnea requiring 4% desaturations.  The channels recorded and monitored were frontal, central and occipital EEG, electrooculogram (EOG), submentalis EMG (chin), nasal and oral airflow, thoracic and abdominal wall motion, anterior tibialis EMG, snore microphone, electrocardiogram, and pulse oximetry.  MEDICATIONS Medications self-administered by patient taken the night of the study : APO-METFORMIN  SLEEP ARCHITECTURE The study was initiated at 11:09:42 PM and ended at 5:43:39 AM.  Sleep onset time was 14.3 minutes and the sleep efficiency was 77.6%%. The total sleep time was 305.6 minutes.  Stage REM latency was 117.5 minutes.  The patient spent 0.7%% of the night in stage N1 sleep, 76.9%% in stage N2 sleep, 0.0%% in stage N3 and 22.4% in REM.  Alpha intrusion was absent.  Supine sleep was 65.46%.  RESPIRATORY PARAMETERS The overall apnea/hypopnea index (AHI) was 2.9 per hour. There were 0 total apneas, including 0 obstructive, 0 central and 0 mixed apneas. There were 15 hypopneas and 8 RERAs.  The AHI during Stage REM sleep was 6.1 per hour.  AHI while supine was 1.5 per hour.  The mean oxygen saturation was 95.9%. The minimum SpO2 during sleep was 89.0%.  loud snoring was noted during this study.  CARDIAC DATA The 2 lead EKG demonstrated sinus rhythm. The mean heart rate was 74.4 beats per minute. Other EKG findings include: None.  LEG MOVEMENT  DATA The total PLMS were 0 with a resulting PLMS index of 0.0. Associated arousal with leg movement index was 0.0 .  IMPRESSIONS - No significant obstructive sleep apnea occurred during this study (AHI = 2.9/h). - No significant central sleep apnea occurred during this study (CAI = 0.0/h). - Oxygen desaturation during the study (Min O2 = 89.0%)  Mean sat 96.2%. - The patient snored with loud snoring volume. - No cardiac abnormalities were noted during this study. - Clinically significant periodic limb movements did not occur during sleep. No significant associated arousals. - Sleep impacted by nocturia x 2 and by back ache requiring transfer to recliner at 4:00 AM.  DIAGNOSIS - Primary snoring  RECOMMENDATIONS - Manage for snoring and symptoms based on clinical judgment. - Be careful with alcohol, sedatives and other CNS depressants that may worsen sleep apnea and disrupt normal sleep architecture. - Sleep hygiene should be reviewed to assess factors that may improve sleep quality. - Weight management and regular exercise should be initiated or continued if appropriate.  [Electronically signed] 10/16/2019 09:50 AM  Jetty Duhamel MD, ABSM Diplomate, American Board of Sleep Medicine   NPI: [id number]                          Jetty Duhamel Diplomate, American Board of Sleep Medicine  ELECTRONICALLY SIGNED ON:  10/16/2019, 9:46 AM [facility] PH: [phone]   FX: [phone] ACCREDITED BY THE AMERICAN ACADEMY OF SLEEP MEDICINE

## 2019-10-25 ENCOUNTER — Telehealth: Payer: Self-pay | Admitting: Family Medicine

## 2019-10-25 NOTE — Telephone Encounter (Signed)
Melinda Ingram is a 44 year old female with a medical history significant for type 2 diabetes mellitus, hypertension, and morbid obesity sent an advise request concerning sleep study. Patient does not have sleep apnea. Study was discussed at length. Also, discussed interventions to assist with loud snoring including weight loss, elevating head, and nasal saline prior to bed. Patient to follow up in office as scheduled.   Nolon Nations  APRN, MSN, FNP-C Patient Care [facility] [address] [place], [place] [address] [phone]

## 2019-12-01 ENCOUNTER — Other Ambulatory Visit: Payer: Self-pay | Admitting: Ophthalmology

## 2020-01-31 ENCOUNTER — Emergency Department (HOSPITAL_BASED_OUTPATIENT_CLINIC_OR_DEPARTMENT_OTHER)
Admission: EM | Admit: 2020-01-31 | Discharge: 2020-01-31 | Disposition: A | Discharge status: Home / Self Care | Payer: Self-pay | Attending: Emergency Medicine | Admitting: Emergency Medicine

## 2020-01-31 ENCOUNTER — Emergency Department (HOSPITAL_BASED_OUTPATIENT_CLINIC_OR_DEPARTMENT_OTHER): Payer: Self-pay

## 2020-01-31 ENCOUNTER — Other Ambulatory Visit: Payer: Self-pay

## 2020-01-31 ENCOUNTER — Encounter (HOSPITAL_BASED_OUTPATIENT_CLINIC_OR_DEPARTMENT_OTHER): Payer: Self-pay | Admitting: *Deleted

## 2020-01-31 DIAGNOSIS — R0789 Other chest pain: Secondary | ICD-10-CM

## 2020-01-31 DIAGNOSIS — Z79899 Other long term (current) drug therapy: Secondary | ICD-10-CM | POA: Insufficient documentation

## 2020-01-31 DIAGNOSIS — Z7984 Long term (current) use of oral hypoglycemic drugs: Secondary | ICD-10-CM | POA: Insufficient documentation

## 2020-01-31 DIAGNOSIS — E119 Type 2 diabetes mellitus without complications: Secondary | ICD-10-CM | POA: Insufficient documentation

## 2020-01-31 DIAGNOSIS — I10 Essential (primary) hypertension: Secondary | ICD-10-CM | POA: Insufficient documentation

## 2020-01-31 LAB — BASIC METABOLIC PANEL
Anion gap: 7 (ref 5–15)
BUN: 24 mg/dL — ABNORMAL HIGH (ref 6–20)
CO2: 27 mmol/L (ref 22–32)
Calcium: 9.4 mg/dL (ref 8.9–10.3)
Chloride: 106 mmol/L (ref 98–111)
Creatinine, Ser: 1.11 mg/dL — ABNORMAL HIGH (ref 0.44–1.00)
GFR, Estimated: >60 mL/min (ref >60)
Glucose, Bld: 117 mg/dL — ABNORMAL HIGH (ref 70–99)
Potassium: 3.3 mmol/L — ABNORMAL LOW (ref 3.5–5.1)
Sodium: 140 mmol/L (ref 135–145)

## 2020-01-31 LAB — CBC
HCT: 37.5 % (ref 36.0–46.0)
Hemoglobin: 12.2 g/dL (ref 12.0–15.0)
MCH: 25.6 pg — ABNORMAL LOW (ref 26.0–34.0)
MCHC: 32.5 g/dL (ref 30.0–36.0)
MCV: 78.6 fL — ABNORMAL LOW (ref 80.0–100.0)
Platelets: 292 10*3/uL (ref 150–400)
RBC: 4.77 MIL/uL (ref 3.87–5.11)
RDW: 15.6 % — ABNORMAL HIGH (ref 11.5–15.5)
WBC: 7.9 10*3/uL (ref 4.0–10.5)
nRBC: 0 % (ref 0.0–0.2)

## 2020-01-31 LAB — PREGNANCY, URINE: Preg Test, Ur: NEGATIVE

## 2020-01-31 LAB — TROPONIN I (HIGH SENSITIVITY): Troponin I (High Sensitivity): 2 ng/L (ref <18)

## 2020-01-31 MED ORDER — NAPROXEN 250 MG PO TABS
500.0000 mg | ORAL_TABLET | Freq: Once | ORAL | Status: DC
  Filled 2020-01-31: qty 2

## 2020-01-31 NOTE — ED Provider Notes (Signed)
[facility] Provider Note: Georgena Spurling, MD, FACEP  CSN: [id number] MRN: [id number] ARRIVAL: 01/31/20 at 2138 ROOM: [place]  Chest Pain   HISTORY OF PRESENT ILLNESS  01/31/20 11:10 PM Melinda Ingram is a 44 y.o. 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  The pain is located in her sternal region and has both sharp and heaviness components.  It is worse with deep breathing and with palpation.  She has had no shortness of breath, diaphoresis, nausea or vomiting.  She has not taken anything for this.  She is concerned because her blood pressure has been elevated lately but it is 135/69 at the present time.  She states she has been under stress recently.   Past Medical History:  Diagnosis Date  . Anxiety   . Arthritis   . Diabetes mellitus without complication ([place])    type 2  . Dyspnea    walking   . Gallstone   . Headache    occasional  . History of kidney stones   . Hypertension    Stopped Lisinopril because of side effects.    Past Surgical History:  Procedure Laterality Date  . cells frozen     dysplasia  . CHOLECYSTECTOMY     01/28/17 Dr. Kae Heller  . CHOLECYSTECTOMY N/A 01/28/2017   Procedure: LAPAROSCOPIC CHOLECYSTECTOMY;  Surgeon: Clovis Riley, MD;  Location: [facility];  Service: General;  Laterality: N/A;  . TUBAL LIGATION      Family History  Problem Relation Age of Onset  . Hypertension Mother   . Diabetes Mother   . Heart failure Mother   . Hypertension Father     Social History   Tobacco Use  . Smoking status: Never Smoker  . Smokeless tobacco: Never Used  Vaping Use  . Vaping Use: Never used  Substance Use Topics  . Alcohol use: Yes    Comment: occasional  . Drug use: No    Prior to Admission medications   Medication Sig Start Date End Date Taking? Authorizing Provider  blood glucose meter kit and supplies KIT Dispense based on patient and insurance  preference. Use up to four times daily as directed. (FOR ICD-9 250.00, 250.01). 02/19/18   Lanae Boast, FNP  hydrochlorothiazide (HYDRODIURIL) 12.5 MG tablet Take 1 tablet (12.5 mg total) by mouth daily. 08/16/19   Dorena Dew, FNP  metFORMIN (GLUCOPHAGE) 500 MG tablet Take 1 tablet (500 mg total) by mouth 2 (two) times daily with a meal. 08/16/19   Dorena Dew, FNP    Allergies Lisinopril   REVIEW OF SYSTEMS  Negative except as noted here or in the History of Present Illness.   PHYSICAL EXAMINATION  Initial Vital Signs Pulse 71, temperature 98.2 F (36.8 C), temperature source Oral, resp. rate 14, height '5\' 2"'  (1.575 m), weight 117 kg, last menstrual period 01/20/2020, SpO2 92 %.  Examination General: Well-developed, well-nourished female in no acute distress; appearance consistent with age of record HENT: normocephalic; atraumatic Eyes: Normal appearance Neck: supple Heart: regular rate and rhythm; no murmur Lungs: clear to auscultation bilaterally Chest: Parasternal tenderness, greater on the left Abdomen: soft; nondistended; nontender; bowel sounds present Extremities: No deformity; full range of motion; pulses normal Neurologic: Awake, alert and oriented; motor function intact in all extremities and symmetric; no facial droop Skin: Warm and dry Psychiatric: Normal mood and affect   RESULTS  Summary of this visit's results, reviewed  and interpreted by myself:   EKG Interpretation  Date/Time:  Tuesday January 31 2020 21:42:03 EST Ventricular Rate:  81 PR Interval:  122 QRS Duration: 76 QT Interval:  388 QTC Calculation: 450 R Axis:   62 Text Interpretation: Normal sinus rhythm Nonspecific T wave abnormality Abnormal ECG Confirmed by Heath Tesler [phone]) on 01/31/2020 11:09:53 PM      Laboratory Studies: Results for orders placed or performed during the hospital encounter of 01/31/20 (from the past 24 hour(s))  Basic metabolic panel     Status:  Abnormal   Collection Time: 01/31/20  9:54 PM  Result Value Ref Range   Sodium 140 135 - 145 mmol/L   Potassium 3.3 (L) 3.5 - 5.1 mmol/L   Chloride 106 98 - 111 mmol/L   CO2 27 22 - 32 mmol/L   Glucose, Bld 117 (H) 70 - 99 mg/dL   BUN 24 (H) 6 - 20 mg/dL   Creatinine, Ser 1.11 (H) 0.44 - 1.00 mg/dL   Calcium 9.4 8.9 - 10.3 mg/dL   GFR, Estimated >60 >60 mL/min   Anion gap 7 5 - 15  CBC     Status: Abnormal   Collection Time: 01/31/20  9:54 PM  Result Value Ref Range   WBC 7.9 4.0 - 10.5 K/uL   RBC 4.77 3.87 - 5.11 MIL/uL   Hemoglobin 12.2 12.0 - 15.0 g/dL   HCT 37.5 36 - 46 %   MCV 78.6 (L) 80.0 - 100.0 fL   MCH 25.6 (L) 26.0 - 34.0 pg   MCHC 32.5 30.0 - 36.0 g/dL   RDW 15.6 (H) 11.5 - 15.5 %   Platelets 292 150 - 400 K/uL   nRBC 0.0 0.0 - 0.2 %  Troponin I (High Sensitivity)     Status: None   Collection Time: 01/31/20  9:54 PM  Result Value Ref Range   Troponin I (High Sensitivity) 2 <18 ng/L  Pregnancy, urine     Status: None   Collection Time: 01/31/20 10:01 PM  Result Value Ref Range   Preg Test, Ur NEGATIVE NEGATIVE   Imaging Studies: DG Chest 2 View  Result Date: 01/31/2020 CLINICAL DATA:  Chest pain since earlier today, hypertension EXAM: CHEST - 2 VIEW COMPARISON:  12/23/2018 FINDINGS: The heart size and mediastinal contours are within normal limits. Both lungs are clear. The visualized skeletal structures are unremarkable. IMPRESSION: No active cardiopulmonary disease. Electronically Signed   By: Randa Ngo M.D.   On: 01/31/2020 22:18    ED COURSE and MDM  Nursing notes, initial and subsequent vitals signs, including pulse oximetry, reviewed and interpreted by myself.  Vitals:   01/31/20 2147 01/31/20 2150 01/31/20 2314  BP:   135/69  Pulse:  71 68  Resp:  14 20  Temp:  98.2 F (36.8 C)   TempSrc:  Oral   SpO2:  92% 99%  Weight: 117 kg    Height: '5\' 2"'  (1.575 m)     Medications  naproxen (NAPROSYN) tablet 500 mg (has no administration in time  range)    The patient's chest pain is clearly reproducible, consistent with chest wall pain.  She has no other symptoms to suggest cardiac etiology.  She was advised to take ibuprofen or Aleve as needed for the pain.  PROCEDURES  Procedures   ED DIAGNOSES     ICD-10-CM   1. Chest wall pain  R07.89        Shanon Rosser, MD 01/31/20 2319

## 2020-01-31 NOTE — ED Triage Notes (Signed)
Pt reports chest pain since earlier today while at work. EMS evaluated her but she declined transport at that time. States her BP has been high

## 2020-02-14 ENCOUNTER — Other Ambulatory Visit: Payer: Self-pay

## 2020-02-14 ENCOUNTER — Other Ambulatory Visit: Payer: Self-pay | Admitting: Family Medicine

## 2020-02-14 ENCOUNTER — Ambulatory Visit (INDEPENDENT_AMBULATORY_CARE_PROVIDER_SITE_OTHER): Payer: Self-pay | Admitting: Family Medicine

## 2020-02-14 ENCOUNTER — Encounter: Payer: Self-pay | Admitting: Family Medicine

## 2020-02-14 VITALS — BP 137/78 | HR 98 | Temp 99.0°F | Resp 20 | Ht 62.0 in | Wt 260.6 lb

## 2020-02-14 DIAGNOSIS — E66813 Obesity, class 3: Secondary | ICD-10-CM

## 2020-02-14 DIAGNOSIS — I10 Essential (primary) hypertension: Secondary | ICD-10-CM

## 2020-02-14 DIAGNOSIS — E119 Type 2 diabetes mellitus without complications: Secondary | ICD-10-CM

## 2020-02-14 DIAGNOSIS — Z1231 Encounter for screening mammogram for malignant neoplasm of breast: Secondary | ICD-10-CM

## 2020-02-14 DIAGNOSIS — Z23 Encounter for immunization: Secondary | ICD-10-CM

## 2020-02-14 HISTORY — DX: Obesity, class 3: E66.813

## 2020-02-14 LAB — POCT URINALYSIS DIP (CLINITEK)
Bilirubin, UA: NEGATIVE
Blood, UA: NEGATIVE
Glucose, UA: NEGATIVE mg/dL
Ketones, POC UA: NEGATIVE mg/dL
Leukocytes, UA: NEGATIVE
Nitrite, UA: NEGATIVE
POC PROTEIN,UA: NEGATIVE
Spec Grav, UA: >=1.03 — AB (ref 1.010–1.025)
Urobilinogen, UA: 0.2 E.U./dL
pH, UA: 5.5 (ref 5.0–8.0)

## 2020-02-14 LAB — GLUCOSE, POCT (MANUAL RESULT ENTRY): POC Glucose: 136 mg/dl — AB (ref 70–99)

## 2020-02-14 LAB — POCT GLYCOSYLATED HEMOGLOBIN (HGB A1C): Hemoglobin A1C: 6.4 % — AB (ref 4.0–5.6)

## 2020-02-14 MED ORDER — HYDROCHLOROTHIAZIDE 25 MG PO TABS: 25.0000 mg | ORAL_TABLET | Freq: Every day | ORAL | 1 refills | Status: DC

## 2020-02-14 NOTE — Progress Notes (Signed)
Patient [facility]   Established Patient Office Visit  Subjective:  Patient ID: Melinda Ingram, female    DOB: 1975-05-20  Age: 44 y.o. MRN: [id number]  CC:  Chief Complaint  Patient presents with   Follow-up    HPI Melinda Ingram is a very pleasant 44 year old female with a medical history significant for type 2 diabetes mellitus, essential hypertension, and morbid obesity presents for follow-up of chronic conditions.  Patient states that she has been doing fairly well and is without complaint.  She has not been following a low-fat, low carbohydrate diet or exercising consistently over the past several months.  Diabetes She presents for her follow-up diabetic visit. She has type 2 diabetes mellitus. Associated symptoms include fatigue. Pertinent negatives for diabetes include no blurred vision, no chest pain, no polydipsia, no polyphagia, no polyuria, no visual change, no weakness and no weight loss. Symptoms are stable. Risk factors for coronary artery disease include obesity and sedentary lifestyle. She is compliant with treatment most of the time. Her weight is increasing steadily. She is following a high fat/cholesterol diet. When asked about meal planning, she reported none. She rarely participates in exercise. An ACE inhibitor/angiotensin II receptor blocker is being taken. She does not see a podiatrist.Eye exam is not current.  Hypertension This is a chronic problem. The problem is controlled. Pertinent negatives include no blurred vision, chest pain, orthopnea or peripheral edema. Risk factors for coronary artery disease include obesity. Compliance problems include diet and exercise.      Past Medical History:  Diagnosis Date   Anxiety    Arthritis    Diabetes mellitus without complication ([place])    type 2   Dyspnea    walking    Gallstone    Headache    occasional   History of kidney stones    Hypertension     Stopped Lisinopril because of side effects.    Past Surgical History:  Procedure Laterality Date   cells frozen     dysplasia   CHOLECYSTECTOMY     01/28/17 Dr. Kae Heller   CHOLECYSTECTOMY N/A 01/28/2017   Procedure: LAPAROSCOPIC CHOLECYSTECTOMY;  Surgeon: Clovis Riley, MD;  Location: [facility];  Service: General;  Laterality: N/A;   TUBAL LIGATION      Family History  Problem Relation Age of Onset   Hypertension Mother    Diabetes Mother    Heart failure Mother    Hypertension Father     Social History   Socioeconomic History   Marital status: Divorced    Spouse name: Not on file   Number of children: Not on file   Years of education: Not on file   Highest education level: Not on file  Occupational History   Not on file  Tobacco Use   Smoking status: Never Smoker   Smokeless tobacco: Never Used  Vaping Use   Vaping Use: Never used  Substance and Sexual Activity   Alcohol use: Yes    Comment: occasional   Drug use: No   Sexual activity: Yes    Birth control/protection: Surgical  Other Topics Concern   Not on file  Social History Narrative   Not on file   Social Determinants of Health   Financial Resource Strain: Not on file  Food Insecurity: Not on file  Transportation Needs: Not on file  Physical Activity: Not on file  Stress: Not on file  Social Connections: Not on file  Intimate  Partner Violence: Not on file    Outpatient Medications Prior to Visit  Medication Sig Dispense Refill   blood glucose meter kit and supplies KIT Dispense based on patient and insurance preference. Use up to four times daily as directed. (FOR ICD-9 250.00, 250.01). 1 each 0   metFORMIN (GLUCOPHAGE) 500 MG tablet Take 1 tablet (500 mg total) by mouth 2 (two) times daily with a meal. 60 tablet 5   hydrochlorothiazide (HYDRODIURIL) 12.5 MG tablet Take 1 tablet (12.5 mg total) by mouth daily. 30 tablet 5   latanoprost (XALATAN) 0.005 % ophthalmic solution  Place 1 drop into both eyes at bedtime.     No facility-administered medications prior to visit.    Allergies  Allergen Reactions   Lisinopril Cough    ROS Review of Systems  Constitutional: Positive for fatigue. Negative for activity change, appetite change and weight loss.  HENT: Negative.   Eyes: Negative.  Negative for blurred vision.  Respiratory: Negative.   Cardiovascular: Negative.  Negative for chest pain and orthopnea.  Gastrointestinal: Negative.   Endocrine: Negative.  Negative for polydipsia, polyphagia and polyuria.  Genitourinary: Negative.   Musculoskeletal: Positive for arthralgias and back pain.  Skin: Negative.   Neurological: Negative.  Negative for weakness.  Psychiatric/Behavioral: Negative.       Objective:    Physical Exam Constitutional:      Appearance: She is obese.  HENT:     Head: Normocephalic.     Mouth/Throat:     Mouth: Mucous membranes are moist.  Eyes:     Pupils: Pupils are equal, round, and reactive to light.  Cardiovascular:     Rate and Rhythm: Normal rate and regular rhythm.     Pulses: Normal pulses.  Pulmonary:     Effort: Pulmonary effort is normal.  Abdominal:     General: Bowel sounds are normal.  Musculoskeletal:        General: Normal range of motion.     Cervical back: Normal range of motion.  Skin:    General: Skin is warm.  Neurological:     General: No focal deficit present.     Mental Status: Mental status is at baseline.  Psychiatric:        Mood and Affect: Mood normal.        Behavior: Behavior normal.        Thought Content: Thought content normal.        Judgment: Judgment normal.     BP 137/78 (BP Location: Left Arm, Patient Position: Sitting, Cuff Size: Large)    Pulse 98    Temp 99 F (37.2 C) (Temporal)    Resp 20    Ht _0  (1.575 m)    Wt 260 lb 9.6 oz (118.2 kg)    LMP 01/20/2020    SpO2 100%    BMI 47.66 kg/m  Wt Readings from Last 3 Encounters:  02/14/20 260 lb 9.6 oz (118.2 kg)   01/31/20 258 lb (117 kg)  10/09/19 260 lb (117.9 kg)     Health Maintenance Due  Topic Date Due   PAP SMEAR-Modifier  08/08/2019    There are no preventive care reminders to display for this patient.  Lab Results  Component Value Date   TSH 1.360 02/15/2019   Lab Results  Component Value Date   WBC 7.9 01/31/2020   HGB 12.2 01/31/2020   HCT 37.5 01/31/2020   MCV 78.6 (L) 01/31/2020   PLT 292 01/31/2020   Lab Results  Component Value Date   NA 140 01/31/2020   K 3.3 (L) 01/31/2020   CO2 27 01/31/2020   GLUCOSE 117 (H) 01/31/2020   BUN 24 (H) 01/31/2020   CREATININE 1.11 (H) 01/31/2020   BILITOT <0.2 10/06/2018   ALKPHOS 81 10/06/2018   AST 8 10/06/2018   ALT 8 10/06/2018   PROT 6.7 10/06/2018   ALBUMIN 4.2 10/06/2018   CALCIUM 9.4 01/31/2020   ANIONGAP 7 01/31/2020   Lab Results  Component Value Date   CHOL 182 10/06/2018   Lab Results  Component Value Date   HDL 60 10/06/2018   Lab Results  Component Value Date   LDLCALC 108 (H) 10/06/2018   Lab Results  Component Value Date   TRIG 72 10/06/2018   Lab Results  Component Value Date   CHOLHDL 3.0 10/06/2018   Lab Results  Component Value Date   HGBA1C 6.4 (A) 02/14/2020      Assessment & Plan:   Problem List Items Addressed This Visit      Cardiovascular and Mediastinum   Essential hypertension   Relevant Medications   hydrochlorothiazide (HYDRODIURIL) 25 MG tablet   Other Relevant Orders   POCT URINALYSIS DIP (CLINITEK) (Completed)     Other   Class 3 severe obesity due to excess calories with serious comorbidity in adult, unspecified BMI ([place])    Other Visit Diagnoses    Type 2 diabetes mellitus without complication, without long-term current use of insulin (HCC)    -  Primary   Relevant Orders   Glucose (CBG) (Completed)   HgB A1c (Completed)   POCT URINALYSIS DIP (CLINITEK) (Completed)   Flu vaccine need       Relevant Orders   Flu Vaccine QUAD 6+ mos PF IM (Fluarix Quad  PF) (Completed)   Screening mammogram for breast cancer       Relevant Orders   MM Digital Screening      Meds ordered this encounter  Medications   hydrochlorothiazide (HYDRODIURIL) 25 MG tablet    Sig: Take 1 tablet (25 mg total) by mouth daily.    Dispense:  90 tablet    Refill:  1    Order Specific Question:   Supervising Provider    Answer:   Angelica Chessman E [[id number]]   5. Type 2 diabetes mellitus without complication, without long-term current use of insulin (HCC) Hemoglobin A1c is 6.4, which is at goal.  Vies to follow a low-fat, low carbohydrate diet divided over small meals throughout the day.  Also, increase daily physical activity.  Recommend 150 minutes/week of low impact cardiovascular exercise. - Glucose (CBG) - HgB A1c - POCT URINALYSIS DIP (CLINITEK) - Comprehensive metabolic panel  2. Essential hypertension BP 137/78 (BP Location: Left Arm, Patient Position: Sitting, Cuff Size: Large)    Pulse 98    Temp 99 F (37.2 C) (Temporal)    Resp 20    Ht _0  (1.575 m)    Wt 260 lb 9.6 oz (118.2 kg)    LMP 01/20/2020    SpO2 100%    BMI 47.66 kg/m  - Continue medication, monitor blood pressure at home. Continue DASH diet. Reminder to go to the ER if any CP, SOB, nausea, dizziness, severe HA, changes vision/speech, left arm numbness and tingling and jaw pain.    - POCT URINALYSIS DIP (CLINITEK) - hydrochlorothiazide (HYDRODIURIL) 25 MG tablet; Take 1 tablet (25 mg total) by mouth daily.  Dispense: 90 tablet; Refill: 1 - Comprehensive metabolic panel  3. Flu vaccine need  - Flu Vaccine QUAD 6+ mos PF IM (Fluarix Quad PF)  4. Class 3 severe obesity due to excess calories with serious comorbidity in adult, unspecified BMI ([place]) The patient is asked to make an attempt to improve diet and exercise patterns to aid in medical management of this problem.   5. Screening mammogram for breast cancer - MM Digital Screening; Future    Follow-up: Return in about 3  months (around 05/14/2020) for diabetes, hypertension.    [name]  APRN, MSN, FNP-C Patient [facility] [address] [place], [place] [address] [phone]

## 2020-02-14 NOTE — Progress Notes (Signed)
Elevated BP home readings   Needs RFs on both meds   Flu vaccine today

## 2020-02-15 LAB — COMPREHENSIVE METABOLIC PANEL
ALT: 11 IU/L (ref 0–32)
AST: 11 IU/L (ref 0–40)
Albumin/Globulin Ratio: 1.6 (ref 1.2–2.2)
Albumin: 4.2 g/dL (ref 3.8–4.8)
Alkaline Phosphatase: 78 IU/L (ref 44–121)
BUN/Creatinine Ratio: 18 (ref 9–23)
BUN: 16 mg/dL (ref 6–24)
Bilirubin Total: <0.2 mg/dL (ref 0.0–1.2)
CO2: 24 mmol/L (ref 20–29)
Calcium: 9.5 mg/dL (ref 8.7–10.2)
Chloride: 103 mmol/L (ref 96–106)
Creatinine, Ser: 0.91 mg/dL (ref 0.57–1.00)
GFR calc Af Amer: 88 mL/min/{1.73_m2} (ref >59)
GFR calc non Af Amer: 76 mL/min/{1.73_m2} (ref >59)
Globulin, Total: 2.6 g/dL (ref 1.5–4.5)
Glucose: 112 mg/dL — ABNORMAL HIGH (ref 65–99)
Potassium: 3.6 mmol/L (ref 3.5–5.2)
Sodium: 139 mmol/L (ref 134–144)
Total Protein: 6.8 g/dL (ref 6.0–8.5)

## 2020-02-16 ENCOUNTER — Telehealth: Payer: Self-pay

## 2020-02-16 NOTE — Telephone Encounter (Signed)
-----   Message from Lachina M Hollis, FNP sent at 02/16/2020  3:43 PM EST ----- Regarding: Lab results Please inform patient that all laboratory values are within a normal range. Creatinine level has improved from recent emergency room visit. Remind patient to hydrate consistently and follow a low-carb, low-fat diet divided over 5-6 small meals throughout the day. Also, increase daily physical activity, recommend 30 minutes a day 3-5 days/week. Follow-up in office as scheduled.    Lachina Moore Hollis  APRN, MSN, FNP-C Patient Care Center [facility] [address]  [place], [place] [address] [phone]    

## 2020-02-16 NOTE — Telephone Encounter (Signed)
Called and lvm for pt to call back regarding  Lab results.

## 2020-02-17 ENCOUNTER — Telehealth: Payer: Self-pay

## 2020-02-17 NOTE — Telephone Encounter (Signed)
-----   Message from Lachina M Hollis, FNP sent at 02/16/2020  3:43 PM EST ----- Regarding: Lab results Please inform patient that all laboratory values are within a normal range. Creatinine level has improved from recent emergency room visit. Remind patient to hydrate consistently and follow a low-carb, low-fat diet divided over 5-6 small meals throughout the day. Also, increase daily physical activity, recommend 30 minutes a day 3-5 days/week. Follow-up in office as scheduled.    Lachina Moore Hollis  APRN, MSN, FNP-C Patient Care Center [facility] [address]  [place], [place] [address] [phone]    

## 2020-02-17 NOTE — Telephone Encounter (Signed)
Called spoke w/ pt regarding her lab results. Informed pt that her results. Reminded pt of follow up appt,

## 2020-02-17 NOTE — Telephone Encounter (Signed)
-----   Message from Massie Maroon, [place] sent at 02/16/2020  3:43 PM EST ----- Regarding: Lab results Please inform patient that all laboratory values are within a normal range. Creatinine level has improved from recent emergency room visit. Remind patient to hydrate consistently and follow a low-carb, low-fat diet divided over 5-6 small meals throughout the day. Also, increase daily physical activity, recommend 30 minutes a day 3-5 days/week. Follow-up in office as scheduled.    Nolon Nations  APRN, MSN, FNP-C Patient Care [facility] [address] [place], [place] [address] [phone]

## 2020-02-17 NOTE — Telephone Encounter (Signed)
Called lvm for pt call back regarding lab results ?

## 2020-06-18 ENCOUNTER — Other Ambulatory Visit: Payer: Self-pay | Admitting: Family Medicine

## 2020-06-18 ENCOUNTER — Other Ambulatory Visit: Payer: Self-pay

## 2020-06-18 MED FILL — Hydrochlorothiazide Tab 25 MG: ORAL | 30 days supply | Qty: 30 | Fill #0 | Status: AC

## 2020-06-20 ENCOUNTER — Telehealth: Payer: Self-pay

## 2020-06-20 ENCOUNTER — Other Ambulatory Visit: Payer: Self-pay

## 2020-06-20 DIAGNOSIS — E119 Type 2 diabetes mellitus without complications: Secondary | ICD-10-CM

## 2020-06-20 MED ORDER — METFORMIN HCL 500 MG PO TABS
500.0000 mg | ORAL_TABLET | Freq: Two times a day (BID) | ORAL | 1 refills | Status: DC
  Filled 2020-06-20: qty 60, 30d supply, fill #0
  Filled 2020-07-26 – 2020-08-03 (×2): qty 60, 30d supply, fill #1

## 2020-06-20 NOTE — Telephone Encounter (Signed)
Called and spoke with patient,sent in her refill to the pharmacy

## 2020-06-20 NOTE — Telephone Encounter (Signed)
Med refill Metformin  Per pt Melinda Ingram DENIED it

## 2020-06-21 ENCOUNTER — Other Ambulatory Visit: Payer: Self-pay

## 2020-06-27 ENCOUNTER — Other Ambulatory Visit: Payer: Self-pay

## 2020-07-26 ENCOUNTER — Other Ambulatory Visit: Payer: Self-pay

## 2020-07-26 MED FILL — Hydrochlorothiazide Tab 25 MG: ORAL | 30 days supply | Qty: 30 | Fill #1 | Status: CN

## 2020-07-27 ENCOUNTER — Other Ambulatory Visit: Payer: Self-pay | Admitting: Family Medicine

## 2020-07-27 ENCOUNTER — Other Ambulatory Visit: Payer: Self-pay

## 2020-07-27 DIAGNOSIS — E119 Type 2 diabetes mellitus without complications: Secondary | ICD-10-CM

## 2020-08-01 MED ORDER — BLOOD GLUCOSE MONITOR SYSTEM W/DEVICE KIT
PACK | 0 refills | Status: DC
  Filled 2020-08-01: qty 1, 365d supply, fill #0

## 2020-08-02 ENCOUNTER — Other Ambulatory Visit: Payer: Self-pay

## 2020-08-03 ENCOUNTER — Other Ambulatory Visit: Payer: Self-pay | Admitting: Family Medicine

## 2020-08-03 ENCOUNTER — Other Ambulatory Visit: Payer: Self-pay

## 2020-08-03 DIAGNOSIS — E119 Type 2 diabetes mellitus without complications: Secondary | ICD-10-CM

## 2020-08-03 MED FILL — Hydrochlorothiazide Tab 25 MG: ORAL | 30 days supply | Qty: 30 | Fill #1 | Status: AC

## 2020-08-06 ENCOUNTER — Other Ambulatory Visit: Payer: Self-pay

## 2020-08-06 MED ORDER — BLOOD GLUCOSE MONITOR SYSTEM W/DEVICE KIT
PACK | 0 refills | Status: DC
  Filled 2020-08-06: qty 1, 1d supply, fill #0

## 2020-08-08 ENCOUNTER — Other Ambulatory Visit: Payer: Self-pay

## 2020-09-12 ENCOUNTER — Other Ambulatory Visit: Payer: Self-pay | Admitting: Family Medicine

## 2020-09-12 DIAGNOSIS — E119 Type 2 diabetes mellitus without complications: Secondary | ICD-10-CM

## 2020-09-12 MED FILL — Hydrochlorothiazide Tab 25 MG: ORAL | 30 days supply | Qty: 30 | Fill #2 | Status: AC

## 2020-09-13 ENCOUNTER — Other Ambulatory Visit: Payer: Self-pay

## 2020-09-13 MED ORDER — METFORMIN HCL 500 MG PO TABS
500.0000 mg | ORAL_TABLET | Freq: Two times a day (BID) | ORAL | 1 refills | Status: DC
  Filled 2020-09-13: qty 60, 30d supply, fill #0
  Filled 2020-10-10 – 2020-10-19 (×2): qty 60, 30d supply, fill #1

## 2020-09-14 ENCOUNTER — Other Ambulatory Visit: Payer: Self-pay

## 2020-09-14 MED FILL — Latanoprost Ophth Soln 0.005%: OPHTHALMIC | 18 days supply | Qty: 2.5 | Fill #0 | Status: AC

## 2020-10-10 ENCOUNTER — Other Ambulatory Visit: Payer: Self-pay | Admitting: Family Medicine

## 2020-10-10 ENCOUNTER — Other Ambulatory Visit: Payer: Self-pay

## 2020-10-11 ENCOUNTER — Other Ambulatory Visit: Payer: Self-pay

## 2020-10-11 MED ORDER — HYDROCHLOROTHIAZIDE 25 MG PO TABS
ORAL_TABLET | Freq: Every day | ORAL | 1 refills | Status: DC
  Filled 2020-10-11 – 2020-10-19 (×2): qty 30, 30d supply, fill #0
  Filled 2020-11-15: qty 30, 30d supply, fill #1

## 2020-10-12 ENCOUNTER — Other Ambulatory Visit: Payer: Self-pay

## 2020-10-15 ENCOUNTER — Other Ambulatory Visit: Payer: Self-pay

## 2020-10-17 ENCOUNTER — Other Ambulatory Visit: Payer: Self-pay

## 2020-10-18 ENCOUNTER — Other Ambulatory Visit: Payer: Self-pay

## 2020-10-19 ENCOUNTER — Other Ambulatory Visit: Payer: Self-pay

## 2020-10-31 ENCOUNTER — Other Ambulatory Visit: Payer: Self-pay

## 2020-10-31 DIAGNOSIS — Z1231 Encounter for screening mammogram for malignant neoplasm of breast: Secondary | ICD-10-CM

## 2020-11-06 ENCOUNTER — Other Ambulatory Visit: Payer: Self-pay | Admitting: Obstetrics and Gynecology

## 2020-11-06 DIAGNOSIS — Z1231 Encounter for screening mammogram for malignant neoplasm of breast: Secondary | ICD-10-CM

## 2020-11-15 ENCOUNTER — Other Ambulatory Visit: Payer: Self-pay | Admitting: Family Medicine

## 2020-11-15 DIAGNOSIS — E119 Type 2 diabetes mellitus without complications: Secondary | ICD-10-CM

## 2020-11-16 ENCOUNTER — Other Ambulatory Visit: Payer: Self-pay

## 2020-11-16 MED ORDER — METFORMIN HCL 500 MG PO TABS
500.0000 mg | ORAL_TABLET | Freq: Two times a day (BID) | ORAL | 1 refills | Status: DC
  Filled 2020-11-16: qty 60, 30d supply, fill #0
  Filled 2020-12-21: qty 60, 30d supply, fill #1

## 2020-12-13 ENCOUNTER — Ambulatory Visit: Payer: Self-pay | Admitting: *Deleted

## 2020-12-13 ENCOUNTER — Ambulatory Visit
Admission: RE | Admit: 2020-12-13 | Discharge: 2020-12-13 | Disposition: A | Discharge status: Home / Self Care | Payer: No Typology Code available for payment source | Source: Ambulatory Visit | Attending: Family Medicine | Admitting: Family Medicine

## 2020-12-13 ENCOUNTER — Other Ambulatory Visit: Payer: Self-pay

## 2020-12-13 VITALS — BP 142/84 | Wt 250.6 lb

## 2020-12-13 DIAGNOSIS — Z1211 Encounter for screening for malignant neoplasm of colon: Secondary | ICD-10-CM

## 2020-12-13 DIAGNOSIS — Z1239 Encounter for other screening for malignant neoplasm of breast: Secondary | ICD-10-CM

## 2020-12-13 DIAGNOSIS — Z1231 Encounter for screening mammogram for malignant neoplasm of breast: Secondary | ICD-10-CM

## 2020-12-13 NOTE — Progress Notes (Signed)
Ms. Meylin Stenzel Chapman Moss is a 45 y.o. female who presents to [facility] clinic today with complaint of bilateral breast tenderness for years when touched. Patient rates the pain at a 1 out of 10.    Pap Smear: Pap smear not completed today. Last Pap smear was 08/07/2016 at the [facility] clinic and was normal with negative HPV. Per patient has history of an abnormal Pap smear over 10 years that cryotherapy was completed for follow-up. Patient stated that all Pap smears have been normal since cryotherapy and that she has had more than three normals. Last Pap smear result is available in Epic.   Physical exam: Breasts Breasts symmetrical. No skin abnormalities bilateral breasts. No nipple retraction bilateral breasts. No nipple discharge bilateral breasts. No lymphadenopathy. No lumps palpated bilateral breasts. Complaints of right outer breast tenderness on exam.      MM DIGITAL SCREENING BILATERAL  Result Date: 05/11/2018 CLINICAL DATA:  Screening. This is the patient's initial baseline mammogram. EXAM: DIGITAL SCREENING BILATERAL MAMMOGRAM WITH CAD COMPARISON:  None. ACR Breast Density Category b: There are scattered areas of fibroglandular density. FINDINGS: There are no findings suspicious for malignancy. Images were processed with CAD. IMPRESSION: No mammographic evidence of malignancy. A result letter of this screening mammogram will be mailed directly to the patient. RECOMMENDATION: Screening mammogram in one year. (Code:[id number]-B-01Y) BI-RADS CATEGORY  1: Negative. Electronically Signed   By: Hulan Saas M.D.   On: 05/11/2018 16:19     Pelvic/Bimanual Pap is not indicated today per BCCCP guidelines.   Smoking History: Patient has never smoked.   Patient Navigation: Patient education provided. Access to services provided for patient through BCCCP program.   Colorectal Cancer Screening: Per patient has never had colonoscopy completed. FIT Test given to patient to complete. No  complaints today.    Breast and Cervical Cancer Risk Assessment: Patient has family history of a paternal aunt, maternal aunt, and two maternal female cousins that have had breast cancer. Patient has no known genetic mutations or history of radiation treatment to the chest before age 66. Per patient has history of cervical dysplasia. Patient has no history of being immunocompromised or DES exposure in-utero.  Risk Assessment     Risk Scores       12/13/2020   Last edited by: Narda Rutherford, LPN   5-year risk: 1 %   Lifetime risk: 9 %           A: BCCCP exam without pap smear Complaint of bilateral breast tenderness.  P: Referred patient to the [facility] for a screening mammogram on mobile unit. Appointment scheduled Thursday, December 13, 2020 at 1020.  Priscille Heidelberg, RN 12/13/2020 9:08 AM

## 2020-12-13 NOTE — Patient Instructions (Signed)
Explained breast self awareness with Melinda Ingram. Patient did not need a Pap smear today due to last Pap smear and HPV typing was 08/07/2016. Let her know BCCCP will cover Pap smears and HPV typing every 5 years unless has a history of abnormal Pap smears and that her next Pap smear is due in June 2023. Referred patient to the [facility] for a screening mammogram on mobile unit. Appointment scheduled Thursday, December 13, 2020 at 1020. Patient escorted to the mobile unit following BCCCP appointment for her screening mammogram. Let patient know the Breast Center will follow up with her within the next couple weeks with results of her mammogram by letter or phone. [name] verbalized understanding.  Evadne Ose, Kathaleen Maser, RN 9:08 AM

## 2020-12-21 ENCOUNTER — Other Ambulatory Visit: Payer: Self-pay | Admitting: Family Medicine

## 2020-12-24 ENCOUNTER — Other Ambulatory Visit: Payer: Self-pay

## 2020-12-24 MED ORDER — HYDROCHLOROTHIAZIDE 25 MG PO TABS
ORAL_TABLET | Freq: Every day | ORAL | 1 refills | Status: DC
  Filled 2020-12-24: qty 30, 30d supply, fill #0
  Filled 2021-01-29: qty 30, 30d supply, fill #1

## 2020-12-25 ENCOUNTER — Other Ambulatory Visit: Payer: Self-pay

## 2020-12-26 ENCOUNTER — Other Ambulatory Visit: Payer: Self-pay

## 2021-01-08 ENCOUNTER — Ambulatory Visit (INDEPENDENT_AMBULATORY_CARE_PROVIDER_SITE_OTHER): Payer: Self-pay | Admitting: Family Medicine

## 2021-01-08 ENCOUNTER — Other Ambulatory Visit: Payer: Self-pay

## 2021-01-08 ENCOUNTER — Encounter: Payer: Self-pay | Admitting: Family Medicine

## 2021-01-08 VITALS — BP 139/88 | HR 69 | Temp 97.9°F | Ht 62.0 in | Wt 253.8 lb

## 2021-01-08 DIAGNOSIS — Z23 Encounter for immunization: Secondary | ICD-10-CM

## 2021-01-08 DIAGNOSIS — E119 Type 2 diabetes mellitus without complications: Secondary | ICD-10-CM

## 2021-01-08 DIAGNOSIS — M549 Dorsalgia, unspecified: Secondary | ICD-10-CM

## 2021-01-08 DIAGNOSIS — I10 Essential (primary) hypertension: Secondary | ICD-10-CM

## 2021-01-08 DIAGNOSIS — G8929 Other chronic pain: Secondary | ICD-10-CM

## 2021-01-08 LAB — POCT GLYCOSYLATED HEMOGLOBIN (HGB A1C)
HbA1c POC (<> result, manual entry): 6.7 % (ref 4.0–5.6)
HbA1c, POC (controlled diabetic range): 6.7 % (ref 0.0–7.0)
HbA1c, POC (prediabetic range): 6.7 % — AB (ref 5.7–6.4)
Hemoglobin A1C: 6.7 % — AB (ref 4.0–5.6)

## 2021-01-08 LAB — POCT URINALYSIS DIP (CLINITEK)
Bilirubin, UA: NEGATIVE
Blood, UA: NEGATIVE
Glucose, UA: NEGATIVE mg/dL
Ketones, POC UA: NEGATIVE mg/dL
Nitrite, UA: NEGATIVE
POC PROTEIN,UA: NEGATIVE
Spec Grav, UA: 1.02 (ref 1.010–1.025)
Urobilinogen, UA: 0.2 E.U./dL
pH, UA: 6.5 (ref 5.0–8.0)

## 2021-01-08 MED ORDER — MELOXICAM 7.5 MG PO TABS
7.5000 mg | ORAL_TABLET | Freq: Every day | ORAL | 0 refills | Status: DC
  Filled 2021-01-08: qty 30, 30d supply, fill #0

## 2021-01-08 NOTE — Progress Notes (Signed)
Patient [facility]   Established Patient Office Visit  Subjective:  Patient ID: Melinda Ingram, female    DOB: 10/15/1975  Age: 45 y.o. MRN: [id number]  CC:  Chief Complaint  Patient presents with  . Follow-up    Follow up;Diabetes Breast tenderness; mammogram was done on 12/13/20.     HPI Kassidy Shavon Silverio Ingram is a very pleasant 45 year old female with a medical history significant for DMII, hypertension, hyperlipidemia, and morbid obesity presents for a follow up of chronic conditions.  Diabetes Pertinent negatives for hypoglycemia include no sweats. Pertinent negatives for diabetes include no blurred vision, no chest pain, no polydipsia, no polyphagia and no polyuria. Pertinent negatives for diabetic complications include no retinopathy.  Hypertension This is a chronic problem. The problem is controlled. Pertinent negatives include no blurred vision, chest pain, orthopnea, peripheral edema, PND, shortness of breath or sweats. Compliance problems include exercise and diet.  There is no history of kidney disease, CAD/MI or retinopathy.    Past Medical History:  Diagnosis Date  . Anxiety   . Arthritis   . Diabetes mellitus without complication ([place])    type 2  . Dyspnea    walking   . Gallstone   . Headache    occasional  . History of kidney stones   . Hypertension    Stopped Lisinopril because of side effects.    Past Surgical History:  Procedure Laterality Date  . cells frozen     dysplasia  . CHOLECYSTECTOMY     01/28/17 Dr. Kae Heller  . CHOLECYSTECTOMY N/A 01/28/2017   Procedure: LAPAROSCOPIC CHOLECYSTECTOMY;  Surgeon: Clovis Riley, MD;  Location: [facility];  Service: General;  Laterality: N/A;  . TUBAL LIGATION      Family History  Problem Relation Age of Onset  . Hypertension Mother   . Diabetes Mother   . Heart failure Mother   . Hypertension Father   . Breast cancer Maternal Aunt   . Breast cancer  Paternal Aunt   . Breast cancer Cousin   . Breast cancer Cousin     Social History   Socioeconomic History  . Marital status: Divorced    Spouse name: Not on file  . Number of children: 2  . Years of education: Not on file  . Highest education level: Associate degree: occupational, Hotel manager, or vocational program  Occupational History  . Not on file  Tobacco Use  . Smoking status: Never  . Smokeless tobacco: Never  Vaping Use  . Vaping Use: Never used  Substance and Sexual Activity  . Alcohol use: Yes    Comment: occasional  . Drug use: No  . Sexual activity: Yes    Birth control/protection: Surgical  Other Topics Concern  . Not on file  Social History Narrative  . Not on file   Social Determinants of Health   Financial Resource Strain: Not on file  Food Insecurity: No Food Insecurity  . Worried About Charity fundraiser in the Last Year: Never true  . Ran Out of Food in the Last Year: Never true  Transportation Needs: No Transportation Needs  . Lack of Transportation (Medical): No  . Lack of Transportation (Non-Medical): No  Physical Activity: Not on file  Stress: Not on file  Social Connections: Not on file  Intimate Partner Violence: Not on file    Outpatient Medications Prior to Visit  Medication Sig Dispense Refill  . Blood Glucose Monitoring Suppl (  BLOOD GLUCOSE MONITOR SYSTEM) w/Device KIT use as directed 1 kit 0  . hydrochlorothiazide (HYDRODIURIL) 25 MG tablet Take 1 tablet (25 mg total) by mouth daily. 90 tablet 1  . hydrochlorothiazide (HYDRODIURIL) 25 MG tablet TAKE 1 TABLET (25 MG TOTAL) BY MOUTH DAILY. Please contact office for an appointment. 30 tablet 1  . latanoprost (XALATAN) 0.005 % ophthalmic solution Place 1 drop into both eyes at bedtime.    . metFORMIN (GLUCOPHAGE) 500 MG tablet Take 1 tablet (500 mg total) by mouth 2 (two) times daily with a meal. 60 tablet 1  . hydrochlorothiazide (MICROZIDE) 12.5 MG capsule TAKE 1 TABLET (12.5 MG TOTAL)  BY MOUTH DAILY. 30 capsule 5  . metFORMIN (GLUCOPHAGE) 500 MG tablet TAKE 1 TABLET BY MOUTH TWICE DAILY WITH A MEAL. 60 tablet 5   No facility-administered medications prior to visit.    Allergies  Allergen Reactions  . Lisinopril Cough    ROS Review of Systems  Constitutional:  Negative for activity change and appetite change.  HENT: Negative.    Eyes: Negative.  Negative for blurred vision.  Respiratory: Negative.  Negative for shortness of breath.   Cardiovascular: Negative.  Negative for chest pain, orthopnea and PND.  Gastrointestinal: Negative.   Endocrine: Negative for polydipsia, polyphagia and polyuria.  Genitourinary: Negative.   Neurological: Negative.   Hematological: Negative.   Psychiatric/Behavioral: Negative.       Objective:    Physical Exam Constitutional:      Appearance: Normal appearance.  Cardiovascular:     Rate and Rhythm: Normal rate and regular rhythm.  Pulmonary:     Effort: Pulmonary effort is normal.  Abdominal:     General: Bowel sounds are normal.  Musculoskeletal:        General: Normal range of motion.  Neurological:     General: No focal deficit present.     Mental Status: Mental status is at baseline.  Psychiatric:        Mood and Affect: Mood normal.        Thought Content: Thought content normal.        Judgment: Judgment normal.    BP 139/88   Pulse 69   Temp 97.9 F (36.6 C)   Ht _0  (1.575 m)   Wt 253 lb 12.8 oz (115.1 kg)   SpO2 99%   BMI 46.42 kg/m  Wt Readings from Last 3 Encounters:  01/08/21 253 lb 12.8 oz (115.1 kg)  12/13/20 250 lb 9.6 oz (113.7 kg)  02/14/20 260 lb 9.6 oz (118.2 kg)     Health Maintenance Due  Topic Date Due  . Pneumococcal Vaccine 66-78 Years old (2 - PCV) 08/07/2017  . COLONOSCOPY (Pts 45-66yr Insurance coverage will need to be confirmed)  Never done  . COVID-19 Vaccine (3 - Booster) 05/23/2019  . PAP SMEAR-Modifier  08/08/2019  . FOOT EXAM  02/15/2020  . HEMOGLOBIN A1C   08/14/2020  . URINE MICROALBUMIN  08/15/2020    There are no preventive care reminders to display for this patient.  Lab Results  Component Value Date   TSH 1.360 02/15/2019   Lab Results  Component Value Date   WBC 7.9 01/31/2020   HGB 12.2 01/31/2020   HCT 37.5 01/31/2020   MCV 78.6 (L) 01/31/2020   PLT 292 01/31/2020   Lab Results  Component Value Date   NA 139 02/14/2020   K 3.6 02/14/2020   CO2 24 02/14/2020   GLUCOSE 112 (H) 02/14/2020   BUN 16  02/14/2020   CREATININE 0.91 02/14/2020   BILITOT <0.2 02/14/2020   ALKPHOS 78 02/14/2020   AST 11 02/14/2020   ALT 11 02/14/2020   PROT 6.8 02/14/2020   ALBUMIN 4.2 02/14/2020   CALCIUM 9.5 02/14/2020   ANIONGAP 7 01/31/2020   Lab Results  Component Value Date   CHOL 182 10/06/2018   Lab Results  Component Value Date   HDL 60 10/06/2018   Lab Results  Component Value Date   LDLCALC 108 (H) 10/06/2018   Lab Results  Component Value Date   TRIG 72 10/06/2018   Lab Results  Component Value Date   CHOLHDL 3.0 10/06/2018   Lab Results  Component Value Date   HGBA1C 6.4 (A) 02/14/2020      Assessment & Plan:   Problem List Items Addressed This Visit   None Visit Diagnoses     Type 2 diabetes mellitus without complication, without long-term current use of insulin (HCC)    -  Primary   Relevant Orders   HgB A1c   POCT URINALYSIS DIP (CLINITEK)       1. Type 2 diabetes mellitus without complication, without long-term current use of insulin (HCC)  - HgB A1c - POCT URINALYSIS DIP (CLINITEK) - Basic Metabolic Panel - Microalbumin/Creatinine Ratio, Urine  2. Essential hypertension - Continue medication, monitor blood pressure at home. Continue DASH diet.  Reminder to go to the ER if any CP, SOB, nausea, dizziness, severe HA, changes vision/speech, left arm numbness and tingling and jaw pain.   - Basic Metabolic Panel  3. Class 3 severe obesity due to excess calories with serious comorbidity in  adult, unspecified BMI ([place]) The patient is asked to make an attempt to improve diet and exercise patterns to aid in medical management of this problem.   4. Need for pneumococcal vaccine  - Pneumococcal polysaccharide vaccine 23-valent greater than or equal to 2yo subcutaneous/IM    Follow-up: Return in about 6 months (around 07/08/2021) for hypertension, diabetes.    [name]  APRN, MSN, FNP-C [facility] [address] [place], [place] [address] [phone]

## 2021-01-08 NOTE — Patient Instructions (Signed)
Back Exercises °These exercises help to make your trunk and back strong. They also help to keep the lower back flexible. Doing these exercises can help to prevent or lessen pain in your lower back. °If you have back pain, try to do these exercises 2-3 times each day or as told by your doctor. °As you get better, do the exercises once each day. Repeat the exercises more often as told by your doctor. °To stop back pain from coming back, do the exercises once each day, or as told by your doctor. °Do exercises exactly as told by your doctor. Stop right away if you feel sudden pain or your pain gets worse. °Exercises °Single knee to chest °Do these steps 3-5 times in a row for each leg: °Lie on your back on a firm bed or the floor with your legs stretched out. °Bring one knee to your chest. °Grab your knee or thigh with both hands and hold it in place. °Pull on your knee until you feel a gentle stretch in your lower back or butt. °Keep doing the stretch for 10-30 seconds. °Slowly let go of your leg and straighten it. °Pelvic tilt °Do these steps 5-10 times in a row: °Lie on your back on a firm bed or the floor with your legs stretched out. °Bend your knees so they point up to the ceiling. Your feet should be flat on the floor. °Tighten your lower belly (abdomen) muscles to press your lower back against the floor. This will make your tailbone point up to the ceiling instead of pointing down to your feet or the floor. °Stay in this position for 5-10 seconds while you gently tighten your muscles and breathe evenly. °Cat-cow °Do these steps until your lower back bends more easily: °Get on your hands and knees on a firm bed or the floor. Keep your hands under your shoulders, and keep your knees under your hips. You may put padding under your knees. °Let your head hang down toward your chest. Tighten (contract) the muscles in your belly. Point your tailbone toward the floor so your lower back becomes rounded like the back of a  cat. °Stay in this position for 5 seconds. °Slowly lift your head. Let the muscles of your belly relax. Point your tailbone up toward the ceiling so your back forms a sagging arch like the back of a cow. °Stay in this position for 5 seconds. ° °Press-ups °Do these steps 5-10 times in a row: °Lie on your belly (face-down) on a firm bed or the floor. °Place your hands near your head, about shoulder-width apart. °While you keep your back relaxed and keep your hips on the floor, slowly straighten your arms to raise the top half of your body and lift your shoulders. Do not use your back muscles. You may change where you place your hands to make yourself more comfortable. °Stay in this position for 5 seconds. Keep your back relaxed. °Slowly return to lying flat on the floor. ° °Bridges °Do these steps 10 times in a row: °Lie on your back on a firm bed or the floor. °Bend your knees so they point up to the ceiling. Your feet should be flat on the floor. Your arms should be flat at your sides, next to your body. °Tighten your butt muscles and lift your butt off the floor until your waist is almost as high as your knees. If you do not feel the muscles working in your butt and the back of   your thighs, slide your feet 1-2 inches (2.5-5 cm) farther away from your butt. °Stay in this position for 3-5 seconds. °Slowly lower your butt to the floor, and let your butt muscles relax. °If this exercise is too easy, try doing it with your arms crossed over your chest. °Belly crunches °Do these steps 5-10 times in a row: °Lie on your back on a firm bed or the floor with your legs stretched out. °Bend your knees so they point up to the ceiling. Your feet should be flat on the floor. °Cross your arms over your chest. °Tip your chin a little bit toward your chest, but do not bend your neck. °Tighten your belly muscles and slowly raise your chest just enough to lift your shoulder blades a tiny bit off the floor. Avoid raising your body  higher than that because it can put too much stress on your lower back. °Slowly lower your chest and your head to the floor. °Back lifts °Do these steps 5-10 times in a row: °Lie on your belly (face-down) with your arms at your sides, and rest your forehead on the floor. °Tighten the muscles in your legs and your butt. °Slowly lift your chest off the floor while you keep your hips on the floor. Keep the back of your head in line with the curve in your back. Look at the floor while you do this. °Stay in this position for 3-5 seconds. °Slowly lower your chest and your face to the floor. °Contact a doctor if: °Your back pain gets a lot worse when you do an exercise. °Your back pain does not get better within 2 hours after you exercise. °If you have any of these problems, stop doing the exercises. Do not do them again unless your doctor says it is okay. °Get help right away if: °You have sudden, very bad back pain. If this happens, stop doing the exercises. Do not do them again unless your doctor says it is okay. °This information is not intended to replace advice given to you by your health care provider. Make sure you discuss any questions you have with your health care provider. °Document Revised: 05/02/2020 Document Reviewed: 05/02/2020 °Elsevier Patient Education © 2022 [employer] ° °

## 2021-01-09 LAB — BASIC METABOLIC PANEL
BUN/Creatinine Ratio: 15 (ref 9–23)
BUN: 13 mg/dL (ref 6–24)
CO2: 27 mmol/L (ref 20–29)
Calcium: 9.8 mg/dL (ref 8.7–10.2)
Chloride: 103 mmol/L (ref 96–106)
Creatinine, Ser: 0.86 mg/dL (ref 0.57–1.00)
Glucose: 62 mg/dL — ABNORMAL LOW (ref 70–99)
Potassium: 4.2 mmol/L (ref 3.5–5.2)
Sodium: 142 mmol/L (ref 134–144)
eGFR: 84 mL/min/{1.73_m2} (ref >59)

## 2021-01-09 LAB — MICROALBUMIN / CREATININE URINE RATIO
Creatinine, Urine: 55.3 mg/dL
Microalb/Creat Ratio: <5 mg/g creat (ref 0–29)
Microalbumin, Urine: <3 ug/mL

## 2021-01-15 ENCOUNTER — Other Ambulatory Visit: Payer: Self-pay

## 2021-01-29 ENCOUNTER — Other Ambulatory Visit: Payer: Self-pay | Admitting: Family Medicine

## 2021-01-29 DIAGNOSIS — E119 Type 2 diabetes mellitus without complications: Secondary | ICD-10-CM

## 2021-01-30 ENCOUNTER — Other Ambulatory Visit: Payer: Self-pay

## 2021-01-30 MED ORDER — METFORMIN HCL 500 MG PO TABS
500.0000 mg | ORAL_TABLET | Freq: Two times a day (BID) | ORAL | 1 refills | Status: DC
  Filled 2021-01-30: qty 60, 30d supply, fill #0
  Filled 2021-03-09: qty 60, 30d supply, fill #1
  Filled 2021-03-11: qty 60, 30d supply, fill #0

## 2021-02-15 ENCOUNTER — Other Ambulatory Visit: Payer: Self-pay

## 2021-02-18 ENCOUNTER — Other Ambulatory Visit: Payer: Self-pay

## 2021-02-18 MED ORDER — LATANOPROST 0.005 % OP SOLN
OPHTHALMIC | 1 refills | Status: DC
Start: 2021-02-18 — End: 2022-02-11
  Filled 2021-02-18 – 2021-03-28 (×2): qty 2.5, 20d supply, fill #0

## 2021-02-20 ENCOUNTER — Other Ambulatory Visit: Payer: Self-pay

## 2021-03-09 ENCOUNTER — Other Ambulatory Visit: Payer: Self-pay | Admitting: Family Medicine

## 2021-03-11 ENCOUNTER — Other Ambulatory Visit: Payer: Self-pay

## 2021-03-11 ENCOUNTER — Other Ambulatory Visit: Payer: Self-pay | Admitting: Family Medicine

## 2021-03-12 ENCOUNTER — Other Ambulatory Visit: Payer: Self-pay

## 2021-03-12 MED ORDER — HYDROCHLOROTHIAZIDE 25 MG PO TABS
25.0000 mg | ORAL_TABLET | Freq: Every day | ORAL | 1 refills | Status: DC
  Filled 2021-03-12: qty 90, 90d supply, fill #0
  Filled 2021-06-01: qty 30, 30d supply, fill #1
  Filled 2021-07-08: qty 30, 30d supply, fill #2

## 2021-03-14 ENCOUNTER — Other Ambulatory Visit: Payer: Self-pay

## 2021-03-28 ENCOUNTER — Other Ambulatory Visit: Payer: Self-pay

## 2021-04-03 ENCOUNTER — Other Ambulatory Visit: Payer: Self-pay

## 2021-04-24 ENCOUNTER — Other Ambulatory Visit: Payer: Self-pay

## 2021-04-24 ENCOUNTER — Other Ambulatory Visit: Payer: Self-pay | Admitting: Family Medicine

## 2021-04-24 DIAGNOSIS — E119 Type 2 diabetes mellitus without complications: Secondary | ICD-10-CM

## 2021-04-25 ENCOUNTER — Other Ambulatory Visit: Payer: Self-pay

## 2021-04-25 MED ORDER — METFORMIN HCL 500 MG PO TABS
500.0000 mg | ORAL_TABLET | Freq: Two times a day (BID) | ORAL | 2 refills | Status: DC
  Filled 2021-04-25: qty 60, 30d supply, fill #0
  Filled 2021-06-01: qty 60, 30d supply, fill #1
  Filled 2021-07-08: qty 60, 30d supply, fill #2

## 2021-04-30 ENCOUNTER — Other Ambulatory Visit: Payer: Self-pay

## 2021-06-03 ENCOUNTER — Other Ambulatory Visit: Payer: Self-pay

## 2021-06-05 ENCOUNTER — Other Ambulatory Visit: Payer: Self-pay

## 2021-07-09 ENCOUNTER — Other Ambulatory Visit: Payer: Self-pay

## 2021-07-09 ENCOUNTER — Ambulatory Visit: Payer: Self-pay | Admitting: Family Medicine

## 2021-07-09 DIAGNOSIS — E119 Type 2 diabetes mellitus without complications: Secondary | ICD-10-CM

## 2021-07-09 MED ORDER — HYDROCHLOROTHIAZIDE 25 MG PO TABS
25.0000 mg | ORAL_TABLET | Freq: Every day | ORAL | 1 refills | Status: DC
  Filled 2021-07-09 – 2021-08-14 (×2): qty 90, 90d supply, fill #0
  Filled 2021-12-13: qty 30, 30d supply, fill #1
  Filled 2022-01-15: qty 30, 30d supply, fill #2
  Filled 2022-02-18 – 2022-02-26 (×2): qty 30, 30d supply, fill #3

## 2021-07-09 MED ORDER — METFORMIN HCL 500 MG PO TABS
500.0000 mg | ORAL_TABLET | Freq: Two times a day (BID) | ORAL | 2 refills | Status: DC
  Filled 2021-07-09 – 2021-08-14 (×2): qty 60, 30d supply, fill #0
  Filled 2021-09-22: qty 60, 30d supply, fill #1
  Filled 2021-10-25: qty 60, 30d supply, fill #2

## 2021-08-06 ENCOUNTER — Encounter: Payer: Self-pay | Admitting: Family Medicine

## 2021-08-06 ENCOUNTER — Other Ambulatory Visit: Payer: Self-pay

## 2021-08-06 ENCOUNTER — Ambulatory Visit (HOSPITAL_COMMUNITY)
Admission: RE | Admit: 2021-08-06 | Discharge: 2021-08-06 | Disposition: A | Discharge status: Home / Self Care | Payer: 59 | Source: Ambulatory Visit | Attending: Family Medicine | Admitting: Family Medicine

## 2021-08-06 ENCOUNTER — Ambulatory Visit (INDEPENDENT_AMBULATORY_CARE_PROVIDER_SITE_OTHER): Payer: PRIVATE HEALTH INSURANCE | Admitting: Family Medicine

## 2021-08-06 VITALS — BP 155/101 | HR 69 | Temp 98.2°F | Ht 62.0 in | Wt 265.4 lb

## 2021-08-06 DIAGNOSIS — I10 Essential (primary) hypertension: Secondary | ICD-10-CM | POA: Diagnosis not present

## 2021-08-06 DIAGNOSIS — M549 Dorsalgia, unspecified: Secondary | ICD-10-CM

## 2021-08-06 DIAGNOSIS — M545 Low back pain, unspecified: Secondary | ICD-10-CM | POA: Insufficient documentation

## 2021-08-06 DIAGNOSIS — F3289 Other specified depressive episodes: Secondary | ICD-10-CM

## 2021-08-06 DIAGNOSIS — E119 Type 2 diabetes mellitus without complications: Secondary | ICD-10-CM | POA: Diagnosis not present

## 2021-08-06 DIAGNOSIS — G8929 Other chronic pain: Secondary | ICD-10-CM

## 2021-08-06 MED ORDER — MELOXICAM 15 MG PO TABS
15.0000 mg | ORAL_TABLET | Freq: Every day | ORAL | 1 refills | Status: DC
  Filled 2021-08-06: qty 30, 30d supply, fill #0
  Filled 2021-09-02: qty 30, 30d supply, fill #1
  Filled 2021-10-04: qty 30, 30d supply, fill #2
  Filled 2021-11-05 – 2021-11-14 (×2): qty 30, 30d supply, fill #3
  Filled 2021-12-13: qty 30, 30d supply, fill #4
  Filled 2022-01-15: qty 30, 30d supply, fill #5

## 2021-08-06 MED ORDER — BUPROPION HCL ER (XL) 150 MG PO TB24
150.0000 mg | ORAL_TABLET | Freq: Every day | ORAL | 11 refills | Status: DC
  Filled 2021-08-06: qty 30, 30d supply, fill #0
  Filled 2021-09-02 (×2): qty 30, 30d supply, fill #1
  Filled 2021-10-04: qty 30, 30d supply, fill #2
  Filled 2021-11-05 – 2021-11-14 (×2): qty 30, 30d supply, fill #3
  Filled 2021-12-13: qty 30, 30d supply, fill #4
  Filled 2022-01-15: qty 30, 30d supply, fill #5
  Filled 2022-02-18 – 2022-02-26 (×2): qty 30, 30d supply, fill #6
  Filled 2022-03-29: qty 30, 30d supply, fill #7
  Filled 2022-05-02: qty 30, 30d supply, fill #8
  Filled 2022-06-05: qty 30, 30d supply, fill #9
  Filled 2022-07-01 (×2): qty 30, 30d supply, fill #10

## 2021-08-06 MED ORDER — KETOROLAC TROMETHAMINE 60 MG/2ML IM SOLN
60.0000 mg | Freq: Once | INTRAMUSCULAR | Status: AC
  Administered 2021-08-06: 60 mg via INTRAMUSCULAR

## 2021-08-06 MED ORDER — TRULICITY 1.5 MG/0.5ML ~~LOC~~ SOAJ
1.5000 mg | SUBCUTANEOUS | 11 refills | Status: DC
  Filled 2021-08-06: qty 2, 28d supply, fill #0

## 2021-08-06 NOTE — Progress Notes (Signed)
[name]   Established Patient Office Visit  Subjective   Patient ID: Melinda Ingram, female    DOB: [birth year]  Age: 46 y.o. MRN: [id number]  Chief Complaint  Patient presents with   Follow-up    6 month follow up for Hypertension and Diabetes Patient states that she is in sever pain today in her joints, right hip lower back. Patient is requesting to get x-rays done on her back and right hip.     Melinda Ingram is a 46 year old female with a medical history significant for type 2 DM, hypertension, hyperlipidemia and morbid obesity that presents for a 6 month follow up of chronic conditions.   Hypertension This is a chronic problem. The problem is controlled. Pertinent negatives include no blurred vision, chest pain, orthopnea, palpitations, PND, shortness of breath or sweats. Risk factors for coronary artery disease include obesity, sedentary lifestyle and diabetes mellitus.  Diabetes She presents for her follow-up diabetic visit. She has type 2 diabetes mellitus. Her disease course has been stable. Pertinent negatives for hypoglycemia include no sweats. Pertinent negatives for diabetes include no blurred vision, no chest pain, no fatigue, no foot paresthesias, no polydipsia, no polyphagia, no polyuria, no weakness and no weight loss. Risk factors for coronary artery disease include obesity and sedentary lifestyle. She does not see a podiatrist.Eye exam is current.    Patient Active Problem List   Diagnosis Date Noted   Class 3 severe obesity due to excess calories with serious comorbidity in adult, unspecified BMI ([place]) 02/14/2020   Pain of joint of left ankle and foot 05/28/2016   Elevated blood pressure reading 05/28/2016   Foot erythema 05/28/2016   Essential hypertension 05/28/2016   Past Medical History:  Diagnosis Date   Anxiety    Arthritis    Diabetes mellitus without complication (HCC)    type 2   Dyspnea     walking    Gallstone    Headache    occasional   History of kidney stones    Hypertension    Stopped Lisinopril because of side effects.   Past Surgical History:  Procedure Laterality Date   cells frozen     dysplasia   CHOLECYSTECTOMY     01/28/17 Dr. Kae Heller   CHOLECYSTECTOMY N/A 01/28/2017   Procedure: LAPAROSCOPIC CHOLECYSTECTOMY;  Surgeon: Clovis Riley, MD;  Location: [facility];  Service: General;  Laterality: N/A;   TUBAL LIGATION     Social History   Tobacco Use   Smoking status: Never   Smokeless tobacco: Never  Vaping Use   Vaping Use: Never used  Substance Use Topics   Alcohol use: Yes    Comment: occasional   Drug use: No   Social History   Socioeconomic History   Marital status: Divorced    Spouse name: Not on file   Number of children: 2   Years of education: Not on file   Highest education level: Associate degree: occupational, Hotel manager, or vocational program  Occupational History   Not on file  Tobacco Use   Smoking status: Never   Smokeless tobacco: Never  Vaping Use   Vaping Use: Never used  Substance and Sexual Activity   Alcohol use: Yes    Comment: occasional   Drug use: No   Sexual activity: Yes    Birth control/protection: Surgical  Other Topics Concern   Not on file  Social History Narrative   Not on file  Social Determinants of Health   Financial Resource Strain: Not on file  Food Insecurity: No Food Insecurity   Worried About Charity fundraiser in the Last Year: Never true   Ran Out of Food in the Last Year: Never true  Transportation Needs: No Transportation Needs   Lack of Transportation (Medical): No   Lack of Transportation (Non-Medical): No  Physical Activity: Not on file  Stress: Not on file  Social Connections: Not on file  Intimate Partner Violence: Not on file   Family Status  Relation Name Status   Mother  Alive   Father  Deceased   Mat Aunt  (Not Specified)   Ethlyn Daniels  (Not Specified)   Cousin female  (Not Specified)   Cousin female 19   Family History  Problem Relation Age of Onset   Hypertension Mother    Diabetes Mother    Heart failure Mother    Hypertension Father    Breast cancer Maternal Aunt    Breast cancer Paternal Aunt    Breast cancer Cousin    Breast cancer Cousin    Allergies  Allergen Reactions   Lisinopril Cough      Review of Systems  Constitutional: Negative.  Negative for fatigue and weight loss.  HENT: Negative.    Eyes: Negative.  Negative for blurred vision.  Respiratory: Negative.  Negative for shortness of breath.   Cardiovascular: Negative.  Negative for chest pain, palpitations, orthopnea and PND.  Gastrointestinal: Negative.   Genitourinary: Negative.   Musculoskeletal:  Positive for back pain and joint pain.  Skin: Negative.   Neurological: Negative.  Negative for weakness.  Endo/Heme/Allergies:  Negative for polydipsia and polyphagia.  Psychiatric/Behavioral: Negative.        Objective:     BP (!) 155/101   Pulse 69   Temp 98.2 F (36.8 C)   Ht _0  (1.575 m)   Wt 265 lb 6.4 oz (120.4 kg)   SpO2 100%   BMI 48.54 kg/m  BP Readings from Last 3 Encounters:  08/06/21 (!) 155/101  01/08/21 139/88  12/13/20 (!) 142/84   Wt Readings from Last 3 Encounters:  08/06/21 265 lb 6.4 oz (120.4 kg)  01/08/21 253 lb 12.8 oz (115.1 kg)  12/13/20 250 lb 9.6 oz (113.7 kg)      Physical Exam Constitutional:      Appearance: She is obese.  HENT:     Mouth/Throat:     Mouth: Mucous membranes are moist.  Eyes:     Pupils: Pupils are equal, round, and reactive to light.  Cardiovascular:     Rate and Rhythm: Normal rate and regular rhythm.  Pulmonary:     Effort: Pulmonary effort is normal.  Abdominal:     General: Bowel sounds are normal.  Skin:    General: Skin is warm.  Neurological:     General: No focal deficit present.     Mental Status: Mental status is at baseline.  Psychiatric:        Mood and Affect: Mood normal.         Behavior: Behavior normal.        Thought Content: Thought content normal.        Judgment: Judgment normal.     No results found for any visits on 08/06/21.  Last CBC Lab Results  Component Value Date   WBC 7.9 01/31/2020   HGB 12.2 01/31/2020   HCT 37.5 01/31/2020   MCV 78.6 (L) 01/31/2020   MCH  25.6 (L) 01/31/2020   RDW 15.6 (H) 01/31/2020   PLT 292 07/11/209   Last metabolic panel Lab Results  Component Value Date   GLUCOSE 62 (L) 01/08/2021   NA 142 01/08/2021   K 4.2 01/08/2021   CL 103 01/08/2021   CO2 27 01/08/2021   BUN 13 01/08/2021   CREATININE 0.86 01/08/2021   EGFR 84 01/08/2021   CALCIUM 9.8 01/08/2021   PROT 6.8 02/14/2020   ALBUMIN 4.2 02/14/2020   LABGLOB 2.6 02/14/2020   AGRATIO 1.6 02/14/2020   BILITOT <0.2 02/14/2020   ALKPHOS 78 02/14/2020   AST 11 02/14/2020   ALT 11 02/14/2020   ANIONGAP 7 01/31/2020   Last lipids Lab Results  Component Value Date   CHOL 182 10/06/2018   HDL 60 10/06/2018   LDLCALC 108 (H) 10/06/2018   TRIG 72 10/06/2018   CHOLHDL 3.0 10/06/2018   Last hemoglobin A1c Lab Results  Component Value Date   HGBA1C 6.7 (A) 01/08/2021   HGBA1C 6.7 01/08/2021   HGBA1C 6.7 (A) 01/08/2021   HGBA1C 6.7 01/08/2021   Last thyroid functions Lab Results  Component Value Date   TSH 1.360 02/15/2019   Last vitamin D Lab Results  Component Value Date   VD25OH 14.3 (L) 10/06/2018   Last vitamin B12 and Folate No results found for: VITAMINB12, FOLATE    The 10-year ASCVD risk score (Arnett DK, et al., 2019) is: 12.4%    Assessment & Plan:   Problem List Items Addressed This Visit       Cardiovascular and Mediastinum   Essential hypertension   Relevant Orders   Comprehensive metabolic panel     Other   Class 3 severe obesity due to excess calories with serious comorbidity in adult, unspecified BMI (HCC)   Relevant Medications   Dulaglutide (TRULICITY) 1.5 ZN/3.5AP SOPN   Other Visit Diagnoses     Acute  midline low back pain without sciatica    -  Primary   Relevant Medications   ketorolac (TORADOL) injection 60 mg   Other Relevant Orders   DG Lumbar Spine Complete   Type 2 diabetes mellitus without complication, without long-term current use of insulin (HCC)       Relevant Medications   Dulaglutide (TRULICITY) 1.5 OL/4.1CV SOPN   Other Relevant Orders   Hemoglobin A1c   Comprehensive metabolic panel   Other depression       Relevant Medications   buPROPion (WELLBUTRIN XL) 150 MG 24 hr tablet     1. Acute midline low back pain without sciatica  - ketorolac (TORADOL) injection 60 mg - DG Lumbar Spine Complete; Future  2. Type 2 diabetes mellitus without complication, without long-term current use of insulin (HCC) Hemoglobin A1c is 7.0.  Has been trending up over the past year.  We will add dulaglutide 0.75 mg weekly for greater control. Also, patient advised to follow a low-fat, carbohydrate modified diet divided over small meals throughout the day. - Hemoglobin A1c - Comprehensive metabolic panel - Dulaglutide (TRULICITY) 0.13 HY/3.8OI SOPN; Inject 0.75 mg into the skin once a week.  Dispense: 0.5 mL; Refill: 11  3. Essential hypertension Blood pressure is not at goal.  Patient did not take BP medications prior to arrival.  Patient advised to take blood pressure medication prior to appointments.  She expressed understanding. - Comprehensive metabolic panel  4. Class 3 severe obesity due to excess calories with serious comorbidity in adult, unspecified BMI ([place]) The patient is asked to make an  attempt to improve diet and exercise patterns to aid in medical management of this problem.   5. Other depression  - buPROPion (WELLBUTRIN XL) 150 MG 24 hr tablet; Take 1 tablet (150 mg total) by mouth daily.  Dispense: 30 tablet; Refill: 11  6. Chronic mid back pain  - meloxicam (MOBIC) 15 MG tablet; Take 1 tablet (15 mg total) by mouth daily.  Dispense: 90 tablet; Refill: 1   Return  in about 3 months (around 11/06/2021).     [name]  APRN, MSN, FNP-C [facility] [address] [place],  [address] [phone]

## 2021-08-06 NOTE — Patient Instructions (Addendum)
Goal is to decrease weight by 10% in 3 months, which is 26.5 pounds.  Decrease any fruits or starchy foods (grits,rice, pasta, chips, bread, soda, juice)  Please read information about trulicity and welbutrin.      Kegel Exercises  Kegel exercises can help strengthen your pelvic floor muscles. The pelvic floor is a group of muscles that support your rectum, small intestine, and bladder. In females, pelvic floor muscles also help support the uterus. These muscles help you control the flow of urine and stool (feces). Kegel exercises are painless and simple. They do not require any equipment. Your provider may suggest Kegel exercises to: Improve bladder and bowel control. Improve sexual response. Improve weak pelvic floor muscles after surgery to remove the uterus (hysterectomy) or after pregnancy, in females. Improve weak pelvic floor muscles after prostate gland removal or surgery, in males. Kegel exercises involve squeezing your pelvic floor muscles. These are the same muscles you squeeze when you try to stop the flow of urine or keep from passing gas. The exercises can be done while sitting, standing, or lying down, but it is best to vary your position. Ask your health care provider which exercises are safe for you. Do exercises exactly as told by your health care provider and adjust them as directed. Do not begin these exercises until told by your health care provider. Exercises How to do Kegel exercises: Squeeze your pelvic floor muscles tight. You should feel a tight lift in your rectal area. If you are a female, you should also feel a tightness in your vaginal area. Keep your stomach, buttocks, and legs relaxed. Hold the muscles tight for up to 10 seconds. Breathe normally. Relax your muscles for up to 10 seconds. Repeat as told by your health care provider. Repeat this exercise daily as told by your health care provider. Continue to do this exercise for at least 4-6 weeks, or for as  long as told by your health care provider. You may be referred to a physical therapist who can help you learn more about how to do Kegel exercises. Depending on your condition, your health care provider may recommend: Varying how long you squeeze your muscles. Doing several sets of exercises every day. Doing exercises for several weeks. Making Kegel exercises a part of your regular exercise routine. This information is not intended to replace advice given to you by your health care provider. Make sure you discuss any questions you have with your health care provider. Document Revised: 06/28/2020 Document Reviewed: 06/28/2020 Elsevier Patient Education  2023 [employer].

## 2021-08-07 ENCOUNTER — Other Ambulatory Visit: Payer: Self-pay

## 2021-08-07 LAB — COMPREHENSIVE METABOLIC PANEL
ALT: 24 IU/L (ref 0–32)
AST: 24 IU/L (ref 0–40)
Albumin/Globulin Ratio: 1.9 (ref 1.2–2.2)
Albumin: 4.4 g/dL (ref 3.8–4.8)
Alkaline Phosphatase: 64 IU/L (ref 44–121)
BUN/Creatinine Ratio: 14 (ref 9–23)
BUN: 12 mg/dL (ref 6–24)
Bilirubin Total: <0.2 mg/dL (ref 0.0–1.2)
CO2: 24 mmol/L (ref 20–29)
Calcium: 9.5 mg/dL (ref 8.7–10.2)
Chloride: 103 mmol/L (ref 96–106)
Creatinine, Ser: 0.86 mg/dL (ref 0.57–1.00)
Globulin, Total: 2.3 g/dL (ref 1.5–4.5)
Glucose: 115 mg/dL — ABNORMAL HIGH (ref 70–99)
Potassium: 3.5 mmol/L (ref 3.5–5.2)
Sodium: 141 mmol/L (ref 134–144)
Total Protein: 6.7 g/dL (ref 6.0–8.5)
eGFR: 83 mL/min/{1.73_m2} (ref >59)

## 2021-08-07 LAB — HEMOGLOBIN A1C
Est. average glucose Bld gHb Est-mCnc: 154 mg/dL
Hgb A1c MFr Bld: 7 % — ABNORMAL HIGH (ref 4.8–5.6)

## 2021-08-07 MED ORDER — TRULICITY 0.75 MG/0.5ML ~~LOC~~ SOAJ
0.7500 mg | SUBCUTANEOUS | 11 refills | Status: DC
  Filled 2021-08-07: qty 2, 28d supply, fill #0
  Filled 2021-09-02 – 2021-09-17 (×3): qty 2, 28d supply, fill #1
  Filled 2021-10-25: qty 2, 28d supply, fill #2

## 2021-08-08 ENCOUNTER — Other Ambulatory Visit: Payer: Self-pay

## 2021-08-09 ENCOUNTER — Other Ambulatory Visit: Payer: Self-pay

## 2021-08-15 ENCOUNTER — Other Ambulatory Visit: Payer: Self-pay

## 2021-08-15 NOTE — Progress Notes (Signed)
Provider has attempted to contact this patient's several times to discuss x-ray results.  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  We will continue to manage conservatively.  May warrant referral to orthopedic specialist if condition worsens.  Hemoglobin A1c was 7 while in clinic.  Trulicity was started at 0.75 mg weekly.  Please inquire about this medication.  Also, recommend a low-fat, low carbohydrate diet divided over small meals.  In addition, low impact cardiovascular exercise 3-5 days/week. Follow-up as scheduled.  Nolon Nations  APRN, MSN, FNP-C [facility] [address] [place], [place] [address] [phone]

## 2021-08-21 ENCOUNTER — Encounter: Payer: Self-pay | Admitting: Family Medicine

## 2021-09-02 ENCOUNTER — Other Ambulatory Visit: Payer: Self-pay

## 2021-09-04 ENCOUNTER — Other Ambulatory Visit: Payer: Self-pay

## 2021-09-06 ENCOUNTER — Other Ambulatory Visit: Payer: Self-pay

## 2021-09-09 ENCOUNTER — Other Ambulatory Visit: Payer: Self-pay

## 2021-09-10 ENCOUNTER — Other Ambulatory Visit: Payer: Self-pay

## 2021-09-12 ENCOUNTER — Other Ambulatory Visit: Payer: Self-pay

## 2021-09-13 ENCOUNTER — Other Ambulatory Visit: Payer: Self-pay

## 2021-09-17 ENCOUNTER — Other Ambulatory Visit (HOSPITAL_COMMUNITY): Payer: Self-pay

## 2021-09-17 ENCOUNTER — Other Ambulatory Visit: Payer: Self-pay

## 2021-09-20 ENCOUNTER — Other Ambulatory Visit: Payer: Self-pay

## 2021-09-23 ENCOUNTER — Other Ambulatory Visit: Payer: Self-pay

## 2021-09-23 MED ORDER — LATANOPROST 0.005 % OP SOLN
OPHTHALMIC | 11 refills | Status: AC
  Filled 2021-09-23: qty 2.5, 25d supply, fill #0
  Filled 2021-10-25: qty 2.5, 25d supply, fill #1
  Filled 2022-02-13: qty 2.5, 25d supply, fill #2
  Filled 2022-03-31: qty 2.5, 25d supply, fill #3
  Filled 2022-05-02: qty 2.5, 25d supply, fill #4
  Filled 2022-06-25: qty 2.5, 25d supply, fill #5
  Filled 2022-08-19: qty 2.5, 25d supply, fill #6

## 2021-09-27 ENCOUNTER — Other Ambulatory Visit: Payer: Self-pay

## 2021-10-03 ENCOUNTER — Telehealth: Payer: Self-pay

## 2021-10-03 NOTE — Telephone Encounter (Signed)
Approved on July 17 Effective from 09/13/2021 through 09/12/2022. Drug Trulicity 0.75MG /0.5ML pen-injectors Form Consulting civil engineer Form (CB)

## 2021-10-04 ENCOUNTER — Other Ambulatory Visit: Payer: Self-pay

## 2021-10-07 ENCOUNTER — Other Ambulatory Visit: Payer: Self-pay

## 2021-10-08 ENCOUNTER — Other Ambulatory Visit: Payer: Self-pay

## 2021-10-14 ENCOUNTER — Telehealth: Payer: Self-pay

## 2021-10-14 NOTE — Telephone Encounter (Signed)
PA was approved for Outcome Approved on July 17 Effective from 09/13/2021 through 09/12/2022. Drug Trulicity 0.75MG /0.5ML pen-injectors Form Consulting civil engineer Form (CB)

## 2021-10-25 ENCOUNTER — Other Ambulatory Visit: Payer: Self-pay

## 2021-10-31 ENCOUNTER — Other Ambulatory Visit: Payer: Self-pay | Admitting: Family Medicine

## 2021-10-31 DIAGNOSIS — Z1231 Encounter for screening mammogram for malignant neoplasm of breast: Secondary | ICD-10-CM

## 2021-11-06 ENCOUNTER — Other Ambulatory Visit: Payer: Self-pay

## 2021-11-07 ENCOUNTER — Other Ambulatory Visit: Payer: Self-pay

## 2021-11-13 ENCOUNTER — Other Ambulatory Visit: Payer: Self-pay

## 2021-11-14 ENCOUNTER — Other Ambulatory Visit: Payer: Self-pay

## 2021-11-15 ENCOUNTER — Other Ambulatory Visit: Payer: Self-pay

## 2021-11-26 ENCOUNTER — Encounter (HOSPITAL_BASED_OUTPATIENT_CLINIC_OR_DEPARTMENT_OTHER): Payer: Self-pay

## 2021-11-26 ENCOUNTER — Telehealth: Payer: Self-pay

## 2021-11-26 ENCOUNTER — Other Ambulatory Visit: Payer: Self-pay | Admitting: Family Medicine

## 2021-11-26 ENCOUNTER — Emergency Department (HOSPITAL_BASED_OUTPATIENT_CLINIC_OR_DEPARTMENT_OTHER)
Admission: EM | Admit: 2021-11-26 | Discharge: 2021-11-26 | Disposition: A | Discharge status: Home / Self Care | Payer: PRIVATE HEALTH INSURANCE | Attending: Emergency Medicine | Admitting: Emergency Medicine

## 2021-11-26 ENCOUNTER — Other Ambulatory Visit: Payer: Self-pay

## 2021-11-26 DIAGNOSIS — E119 Type 2 diabetes mellitus without complications: Secondary | ICD-10-CM | POA: Insufficient documentation

## 2021-11-26 DIAGNOSIS — Z79899 Other long term (current) drug therapy: Secondary | ICD-10-CM | POA: Insufficient documentation

## 2021-11-26 DIAGNOSIS — I10 Essential (primary) hypertension: Secondary | ICD-10-CM | POA: Insufficient documentation

## 2021-11-26 DIAGNOSIS — M545 Low back pain, unspecified: Secondary | ICD-10-CM | POA: Insufficient documentation

## 2021-11-26 DIAGNOSIS — Z7984 Long term (current) use of oral hypoglycemic drugs: Secondary | ICD-10-CM | POA: Insufficient documentation

## 2021-11-26 LAB — URINALYSIS, ROUTINE W REFLEX MICROSCOPIC
Bilirubin Urine: NEGATIVE
Glucose, UA: NEGATIVE mg/dL
Hgb urine dipstick: NEGATIVE
Ketones, ur: NEGATIVE mg/dL
Leukocytes,Ua: NEGATIVE
Nitrite: NEGATIVE
Protein, ur: NEGATIVE mg/dL
Specific Gravity, Urine: 1.015 (ref 1.005–1.030)
pH: 5 (ref 5.0–8.0)

## 2021-11-26 LAB — PREGNANCY, URINE: Preg Test, Ur: NEGATIVE

## 2021-11-26 MED ORDER — DEXAMETHASONE SODIUM PHOSPHATE 10 MG/ML IJ SOLN
10.0000 mg | Freq: Once | INTRAMUSCULAR | Status: AC
  Administered 2021-11-26: 10 mg via INTRAMUSCULAR
  Filled 2021-11-26: qty 1

## 2021-11-26 MED ORDER — KETOROLAC TROMETHAMINE 15 MG/ML IJ SOLN
15.0000 mg | Freq: Once | INTRAMUSCULAR | Status: AC
  Administered 2021-11-26: 15 mg via INTRAMUSCULAR
  Filled 2021-11-26: qty 1

## 2021-11-26 MED ORDER — NAPROXEN 500 MG PO TABS
500.0000 mg | ORAL_TABLET | Freq: Two times a day (BID) | ORAL | 0 refills | Status: DC
  Filled 2021-11-26: qty 30, 15d supply, fill #0

## 2021-11-26 MED ORDER — METHOCARBAMOL 500 MG PO TABS
500.0000 mg | ORAL_TABLET | Freq: Two times a day (BID) | ORAL | 0 refills | Status: DC
  Filled 2021-11-26: qty 20, 10d supply, fill #0

## 2021-11-26 MED ORDER — LIDOCAINE 5 % EX PTCH
1.0000 | MEDICATED_PATCH | Freq: Once | CUTANEOUS | Status: DC
  Administered 2021-11-26: 1 via TRANSDERMAL
  Filled 2021-11-26: qty 1

## 2021-11-26 MED ORDER — TRULICITY 1.5 MG/0.5ML ~~LOC~~ SOAJ
1.5000 mg | SUBCUTANEOUS | 11 refills | Status: DC
  Filled 2021-11-26: qty 0.5, 7d supply, fill #0

## 2021-11-26 MED ORDER — METHYLPREDNISOLONE 4 MG PO TBPK
ORAL_TABLET | ORAL | 0 refills | Status: DC
  Filled 2021-11-26: qty 21, 6d supply, fill #0

## 2021-11-26 MED ORDER — METHOCARBAMOL 500 MG PO TABS
500.0000 mg | ORAL_TABLET | Freq: Once | ORAL | Status: AC
  Administered 2021-11-26: 500 mg via ORAL
  Filled 2021-11-26: qty 1

## 2021-11-26 NOTE — ED Notes (Signed)
Discharge instructions, follow up care, and prescriptions reviewed and explained, pt verbalized understanding. Pt had no further questions on d/c. Pt caox4 and ambulatory on d/c.  

## 2021-11-26 NOTE — Discharge Instructions (Signed)
You were seen here today for Back Pain: Low back pain is discomfort in the lower back that may be due to injuries to muscles and ligaments around the spine. Occasionally, it may be caused by a problem to a part of the spine called a disc. Your back pain should be treated with medicines listed below as well as back exercises and this back pain should get better over the next 2 weeks. Most patients get completely well in 4 weeks. It is important to know however, if you develop severe or worsening pain, low back pain with fever, numbness, weakness or inability to walk or urinate, you should return to the ER immediately.  Please follow up with your doctor this week for a recheck if still having symptoms.  HOME INSTRUCTIONS Self - care:  The application of heat can help soothe the pain.  Maintaining your daily activities, including walking (this is encouraged), as it will help you get better faster than just staying in bed. Do not life, push, pull anything more than 10 pounds for the next week. I am attaching back exercises that you can do at home to help facilitate your recovery.   Back Exercises - I have attached a handout on back exercises that can be done at home to help facilitate your recovery.   Medications are also useful to help with pain control.   Acetaminophen.  This medication is generally safe, and found over the counter. Take as directed for your age. You should not take more than 8 of the extra strength (500mg ) pills a day (max dose is 4000mg  total OVER one day)  Non steroidal anti inflammatory: This includes medications including Ibuprofen, naproxen and Mobic; These medications help both pain and swelling and are very useful in treating back pain.  They should be taken with food, as they can cause stomach upset, and more seriously, stomach bleeding. Do not combine the medications. If you can not take medications such as ibuprofen and naproxen, medications such as Diclofenac (Voltaren) gel can  be applied directly to the area of pain.   Lidocaine Patch: Salon Pas lidocaine patches (blue and silver box) can be purchased over the counter and worn for 12 hours for local pain relief    Muscle relaxants:  These medications can help with muscle tightness that is a cause of lower back pain.  Most of these medications can cause drowsiness, and it is not safe to drive or use dangerous machinery while taking them. They are primarily helpful when taken at night before sleep.  Medrol -  This is an oral steroid.  This medication is best taken with food in the morning.  Please note that this medication can cause anxiety, mood swings, muscle fatigue, increased hunger, weight gain (sodium/fluid retention), poor sleep as well as other symptoms. If you are a diabetic, please monitor your blood sugars at home as this medication can increase your blood sugars. Call your pharmacist if you have any questions.  You will need to follow up with your primary healthcare provider  or the Orthopedist in 1-2 weeks for reassessment and persistent symptoms.  Be aware that if you develop new symptoms, such as a fever, leg weakness, difficulty with or loss of control of your urine or bowels, abdominal pain, or more severe pain, you will need to seek medical attention and/or return to the Emergency department. Additional Information:  Your vital signs today were: BP (!) 157/94 (BP Location: Right Arm)   Pulse 77  Temp 98.1 F (36.7 C)   Resp 18   Ht 5\' 2"  (1.575 m)   Wt 120.4 kg   SpO2 98%   BMI 48.55 kg/m  If your blood pressure (BP) was elevated above 135/85 this visit, please have this repeated by your doctor within one month. ---------------

## 2021-11-26 NOTE — Progress Notes (Signed)
Meds ordered this encounter  Medications   Dulaglutide (TRULICITY) 1.5 MG/0.5ML SOPN    Sig: Inject 1.5 mg into the skin once a week.    Dispense:  0.5 mL    Refill:  11    Order Specific Question:   Supervising Provider    Answer:   JEGEDE, OLUGBEMIGA E [[id number]]   Aletheia Tangredi Moore Amahd Morino  APRN, MSN, FNP-C [facility] [address]  [place], [place] [address] [phone]  

## 2021-11-26 NOTE — ED Triage Notes (Signed)
Patient here POV from Home.  Endorses Lower Back Pain that radiates to Bilateral Hip at Times. Present for a Few Days and has been worsening.   No N/V/D. No Fevers. No known Injury. No Dysuria. History of Arthritis to Same Area.  NAD Noted during Triage, A&Ox4. GCS 15. Ambulatory.

## 2021-11-26 NOTE — ED Provider Notes (Signed)
[facility] EMERGENCY DEPT Provider Note   CSN: [id number] Arrival date & time: 11/26/21  1544     History  Chief Complaint  Patient presents with   Back Pain    Melinda Ingram is a 46 y.o. female.  Melinda Ingram is a 46 y.o. female with a history of hypertension, diabetes, who presents to the emergency department for evaluation of low back pain.  She reports this has been occurring for the past 3 to 4 months and that will clear up and then flareup for a few days.  Current flareup has been occurring for the past 3 to 4 days.  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  She denies any inciting injury to the back.  She reports that it will radiate to her hips intermittently but does not go all the way down her legs.  No associated numbness or weakness.  No loss of bowel or bladder control or saddle anesthesia.  No fevers or chills.  No history of cancer or IV drug use.  No associated abdominal pain, nausea, vomiting, diarrhea, no urinary symptoms.  Reports that she saw her primary care doctor for this back pain and they did x-rays that showed arthritis.  She reports that they gave her a shot that seemed to help.  She has been taking over-the-counter medications without relief for the past few days.  The history is provided by the patient.  Back Pain Associated symptoms: no abdominal pain, no chest pain, no dysuria, no fever, no numbness and no weakness        Home Medications Prior to Admission medications   Medication Sig Start Date End Date Taking? Authorizing Provider  methocarbamol (ROBAXIN) 500 MG tablet Take 1 tablet (500 mg total) by mouth 2 (two) times daily. 11/26/21  Yes Jacqlyn Larsen, PA-C  methylPREDNISolone (MEDROL DOSEPAK) 4 MG TBPK tablet Take as directed 11/26/21  Yes Jacqlyn Larsen, PA-C  naproxen (NAPROSYN) 500 MG tablet Take 1 tablet (500 mg total) by mouth 2 (two) times daily. 11/26/21  Yes  Jacqlyn Larsen, PA-C  Blood Glucose Monitoring Suppl (BLOOD GLUCOSE MONITOR SYSTEM) w/Device KIT use as directed Patient not taking: Reported on 08/06/2021 08/06/20   Dorena Dew, FNP  buPROPion (WELLBUTRIN XL) 150 MG 24 hr tablet Take 1 tablet (150 mg total) by mouth daily. 08/06/21   Dorena Dew, FNP  Dulaglutide (TRULICITY) 1.5 WV/7.9NR SOPN Inject 1.5 mg into the skin once a week. 11/26/21   Dorena Dew, FNP  hydrochlorothiazide (HYDRODIURIL) 25 MG tablet Take 1 tablet (25 mg total) by mouth once daily. 07/09/21   Dorena Dew, FNP  latanoprost (XALATAN) 0.005 % ophthalmic solution Instill 1 drop both eyes at bedtime. Please call our office for an appointment 02/18/21     latanoprost (XALATAN) 0.005 % ophthalmic solution Instill 1 drop Both Eyes at bedtime 09/23/21     meloxicam (MOBIC) 15 MG tablet Take 1 tablet (15 mg total) by mouth daily. 08/06/21   Dorena Dew, FNP  metFORMIN (GLUCOPHAGE) 500 MG tablet Take 1 tablet (500 mg total) by mouth 2 (two) times daily with a meal. 07/09/21   Dorena Dew, FNP      Allergies    Lisinopril    Review of Systems   Review of Systems  Constitutional:  Negative for chills and fever.  HENT: Negative.    Respiratory:  Negative for shortness of breath.   Cardiovascular:  Negative for chest pain.  Gastrointestinal:  Negative for abdominal pain, constipation, diarrhea, nausea and vomiting.  Genitourinary:  Negative for dysuria, flank pain, frequency and hematuria.  Musculoskeletal:  Positive for back pain. Negative for arthralgias, gait problem, joint swelling, myalgias and neck pain.  Skin:  Negative for color change, rash and wound.  Neurological:  Negative for weakness and numbness.    Physical Exam Updated Vital Signs BP (!) 157/94 (BP Location: Right Arm)   Pulse 77   Temp 98.1 F (36.7 C)   Resp 18   Ht '5\' 2"'  (1.575 m)   Wt 120.4 kg   SpO2 98%   BMI 48.55 kg/m  Physical Exam Vitals and nursing note reviewed.   Constitutional:      General: She is not in acute distress.    Appearance: She is well-developed. She is not diaphoretic.  HENT:     Head: Atraumatic.  Eyes:     General:        Right eye: No discharge.        Left eye: No discharge.  Cardiovascular:     Pulses:          Radial pulses are 2+ on the right side and 2+ on the left side.       Dorsalis pedis pulses are 2+ on the right side and 2+ on the left side.       Posterior tibial pulses are 2+ on the right side and 2+ on the left side.  Pulmonary:     Effort: Pulmonary effort is normal. No respiratory distress.  Abdominal:     General: Bowel sounds are normal. There is no distension.     Palpations: Abdomen is soft. There is no mass.     Tenderness: There is no abdominal tenderness. There is no guarding.     Comments: Abdomen soft, nondistended, nontender to palpation in all quadrants without guarding or peritoneal signs, no CVA tenderness bilaterally  Musculoskeletal:     Cervical back: Neck supple.     Comments: Mild tenderness to palpation over the low back, slightly worse on the left than right.  Patient to range lower extremities, negative straight leg raise bilaterally.  No skin changes over the back.  Skin:    General: Skin is warm and dry.     Capillary Refill: Capillary refill takes less than 2 seconds.  Neurological:     Mental Status: She is alert and oriented to person, place, and time.     Comments: Alert, clear speech, following commands. Moving all extremities without difficulty. Bilateral lower extremities with 5/5 strength in proximal and distal muscle groups and with dorsi and plantar flexion. Sensation intact in bilateral lower extremities. 2+ patellar DTRs bilaterally. Ambulatory with steady gait  Psychiatric:        Behavior: Behavior normal.     ED Results / Procedures / Treatments   Labs (all labs ordered are listed, but only abnormal results are displayed) Labs Reviewed  URINALYSIS, ROUTINE W  REFLEX MICROSCOPIC  PREGNANCY, URINE    EKG None  Radiology No results found.  Procedures Procedures    Medications Ordered in ED Medications  lidocaine (LIDODERM) 5 % 1 patch (1 patch Transdermal Patch Applied 11/26/21 1916)  ketorolac (TORADOL) 15 MG/ML injection 15 mg (15 mg Intramuscular Given 11/26/21 1918)  dexamethasone (DECADRON) injection 10 mg (10 mg Intramuscular Given 11/26/21 1919)  methocarbamol (ROBAXIN) tablet 500 mg (500 mg Oral Given  11/26/21 1917)    ED Course/ Medical Decision Making/ A&P                           Medical Decision Making Amount and/or Complexity of Data Reviewed Labs: ordered.  Risk Prescription drug management.   Patient presents to the ED with complaints of  back pain.  Nontoxic, vitals significant for mild hypertension but otherwise normal.   Additional history obtained:  Additional history obtained from chart review & nursing note review.  I reviewed x-rays of the lumbar spine from June which showed some arthritis and degenerative changes.  Some mild intervertebral disc space narrowing noted at L5-S1  Lab Tests:  I Ordered, reviewed, and interpreted labs, which included: UA negative for infection   ED Course:  Patient has no back pain red flags.  No neurologic deficits, ambulatory- doubt cauda equina syndrome or acute cord compression. Afebrile, no hx of IVDU- doubt epidural abscess. No urinary sxs- feel that UTI/nephrolithiasis. Symmetric pulses, not a tearing sensation, overall well appearing, feel that dissection is unlikely at this time. Favor musculoskeletal pain- likely strain/spasm. Will treat with steroid, naproxen and Robaxin, discussed with patient that they are not to drive or operate heavy machinery while taking Robaxin. I discussed treatment plan, need for PCP follow-up, and return precautions with the patient. Provided opportunity for questions, patient confirmed understanding and is in agreement with plan.   Portions  of this note were generated with [employer]. Dictation errors may occur despite best attempts at proofreading.          Final Clinical Impression(s) / ED Diagnoses Final diagnoses:  Acute bilateral low back pain without sciatica    Rx / DC Orders ED Discharge Orders          Ordered    naproxen (NAPROSYN) 500 MG tablet  2 times daily        11/26/21 2046    methocarbamol (ROBAXIN) 500 MG tablet  2 times daily        11/26/21 2046    methylPREDNISolone (MEDROL DOSEPAK) 4 MG TBPK tablet        11/26/21 2046              Jacqlyn Larsen, PA-C 31/43/88 [id number]    Campbell Stall P, DO 97/28/20 [phone]

## 2021-11-26 NOTE — ED Notes (Signed)
Ambulatpry to restroom

## 2021-11-26 NOTE — Telephone Encounter (Signed)
Pt called and wants her dosage higher for her Trulicity

## 2021-11-27 ENCOUNTER — Other Ambulatory Visit: Payer: Self-pay

## 2021-12-04 ENCOUNTER — Other Ambulatory Visit: Payer: Self-pay | Admitting: Family Medicine

## 2021-12-04 ENCOUNTER — Other Ambulatory Visit: Payer: Self-pay

## 2021-12-04 DIAGNOSIS — E119 Type 2 diabetes mellitus without complications: Secondary | ICD-10-CM

## 2021-12-04 MED ORDER — TRULICITY 3 MG/0.5ML ~~LOC~~ SOAJ
3.0000 mg | SUBCUTANEOUS | 5 refills | Status: DC
  Filled 2021-12-04: qty 0.5, 7d supply, fill #0
  Filled 2021-12-16 – 2021-12-25 (×2): qty 2, 28d supply, fill #0
  Filled 2022-01-15 – 2022-01-16 (×2): qty 2, 28d supply, fill #1
  Filled 2022-02-07: qty 2, 28d supply, fill #2

## 2021-12-04 NOTE — Progress Notes (Signed)
Meds ordered this encounter  Medications   Dulaglutide (TRULICITY) 3 NW/2.9FA SOPN    Sig: Inject 3 mg as directed once a week.    Dispense:  0.5 mL    Refill:  5    Order Specific Question:   Supervising Provider    Answer:   Tresa Garter [[id number]]   [name]  APRN, MSN, FNP-C Patient [facility] [address] [place], [place] [address] [phone]

## 2021-12-13 ENCOUNTER — Other Ambulatory Visit: Payer: Self-pay

## 2021-12-13 ENCOUNTER — Other Ambulatory Visit: Payer: Self-pay | Admitting: Family Medicine

## 2021-12-13 DIAGNOSIS — E119 Type 2 diabetes mellitus without complications: Secondary | ICD-10-CM

## 2021-12-13 MED ORDER — METFORMIN HCL 500 MG PO TABS
500.0000 mg | ORAL_TABLET | Freq: Two times a day (BID) | ORAL | 2 refills | Status: DC
  Filled 2021-12-13: qty 60, 30d supply, fill #0
  Filled 2022-02-04: qty 60, 30d supply, fill #1
  Filled 2022-02-26: qty 60, 30d supply, fill #2

## 2021-12-16 ENCOUNTER — Ambulatory Visit: Payer: PRIVATE HEALTH INSURANCE

## 2021-12-16 ENCOUNTER — Other Ambulatory Visit: Payer: Self-pay

## 2021-12-17 ENCOUNTER — Other Ambulatory Visit: Payer: Self-pay

## 2021-12-17 ENCOUNTER — Other Ambulatory Visit (HOSPITAL_COMMUNITY): Payer: Self-pay

## 2021-12-18 ENCOUNTER — Other Ambulatory Visit: Payer: Self-pay

## 2021-12-23 ENCOUNTER — Other Ambulatory Visit: Payer: Self-pay

## 2021-12-25 ENCOUNTER — Other Ambulatory Visit: Payer: Self-pay

## 2021-12-25 DIAGNOSIS — M47816 Spondylosis without myelopathy or radiculopathy, lumbar region: Secondary | ICD-10-CM | POA: Diagnosis not present

## 2022-01-15 ENCOUNTER — Other Ambulatory Visit: Payer: Self-pay

## 2022-01-16 ENCOUNTER — Other Ambulatory Visit: Payer: Self-pay

## 2022-01-17 ENCOUNTER — Other Ambulatory Visit: Payer: Self-pay

## 2022-01-29 ENCOUNTER — Ambulatory Visit
Admission: RE | Admit: 2022-01-29 | Discharge: 2022-01-29 | Disposition: A | Discharge status: Home / Self Care | Payer: 59 | Source: Ambulatory Visit | Attending: Family Medicine | Admitting: Family Medicine

## 2022-01-29 DIAGNOSIS — Z1231 Encounter for screening mammogram for malignant neoplasm of breast: Secondary | ICD-10-CM

## 2022-02-04 ENCOUNTER — Other Ambulatory Visit: Payer: Self-pay

## 2022-02-07 ENCOUNTER — Other Ambulatory Visit: Payer: Self-pay

## 2022-02-10 ENCOUNTER — Other Ambulatory Visit: Payer: Self-pay

## 2022-02-11 ENCOUNTER — Encounter: Payer: Self-pay | Admitting: Family Medicine

## 2022-02-11 ENCOUNTER — Ambulatory Visit: Payer: 59 | Admitting: Family Medicine

## 2022-02-11 VITALS — BP 159/88 | HR 73 | Temp 97.9°F | Ht 62.0 in | Wt 253.2 lb

## 2022-02-11 DIAGNOSIS — I10 Essential (primary) hypertension: Secondary | ICD-10-CM

## 2022-02-11 DIAGNOSIS — E119 Type 2 diabetes mellitus without complications: Secondary | ICD-10-CM

## 2022-02-11 DIAGNOSIS — Z23 Encounter for immunization: Secondary | ICD-10-CM

## 2022-02-11 DIAGNOSIS — Z1211 Encounter for screening for malignant neoplasm of colon: Secondary | ICD-10-CM | POA: Diagnosis not present

## 2022-02-11 LAB — POCT GLYCOSYLATED HEMOGLOBIN (HGB A1C): Hemoglobin A1C: 6.5 % — AB (ref 4.0–5.6)

## 2022-02-11 MED ORDER — TRULICITY 3 MG/0.5ML ~~LOC~~ SOAJ: 3.0000 mg | SUBCUTANEOUS | 5 refills | Status: DC

## 2022-02-11 NOTE — Patient Instructions (Signed)
Today, your hemoglobin A1c was 6.5, which is at goal.  Keep up the good work.  You have also decreased your weight by 12 pounds.  We have recent your Trulicity to a commercial pharmacy, make sure you attempt to utilize the prescription discount card to make the co-pay lesser. I recommend that you continue a carbohydrate modified diet.  Please decrease the amounts of sweetened drinks and desserts I recommend that you exercise around 150 minutes/week Increase your water intake to 64 ounces per day  Continue your diabetes maintenance that includes a routine eye exam.  I have sent a referral to [facility] ophthalmology.  Please schedule first available appointment when they contact you.

## 2022-02-11 NOTE — Progress Notes (Signed)
Subjective   Patient ID: Melinda Ingram, female    DOB: Aug 24, 1975  Age: 46 y.o. MRN: [id number]  Chief Complaint  Patient presents with   Diabetes    diabtetes    Melinda Ingram is a 46 year old female with a medical history significant for type 2 diabetes mellitus, hypertension, hyperlipidemia, and morbid obesity presents for follow-up of chronic conditions.  Patient is doing well and is without complaint on today. She states that she has been taking all medications consistently, with the exception of Trulicity.  Patient has been unable to get Trulicity due to financial constraints. Patient does not exercise nor follow a carbohydrate modified diet.  However, she states that she is decreased her sugar intake over the past month.  Patient is not up-to-date with eye exam and does not follow with a podiatrist.  Diabetes She presents for her follow-up diabetic visit. She has type 2 diabetes mellitus. Her disease course has been stable. Pertinent negatives for diabetes include no blurred vision, no chest pain, no fatigue, no foot paresthesias, no foot ulcerations, no polydipsia, no polyphagia, no polyuria, no visual change, no weakness and no weight loss. There are no diabetic complications. She is compliant with treatment all of the time. She does not see a podiatrist.Eye exam is not current.    Patient Active Problem List   Diagnosis Date Noted   Class 3 severe obesity due to excess calories with serious comorbidity in adult, unspecified BMI ([place]) 02/14/2020   Pain of joint of left ankle and foot 05/28/2016   Elevated blood pressure reading 05/28/2016   Foot erythema 05/28/2016   Essential hypertension 05/28/2016   Past Medical History:  Diagnosis Date   Anxiety    Arthritis    Diabetes mellitus without complication (HCC)    type 2   Dyspnea    walking    Gallstone    Headache    occasional   History of kidney stones    Hypertension    Stopped Lisinopril because of side  effects.   Past Surgical History:  Procedure Laterality Date   cells frozen     dysplasia   CHOLECYSTECTOMY     01/28/17 Dr. Kae Heller   CHOLECYSTECTOMY N/A 01/28/2017   Procedure: LAPAROSCOPIC CHOLECYSTECTOMY;  Surgeon: Clovis Riley, MD;  Location: [facility];  Service: General;  Laterality: N/A;   TUBAL LIGATION     Social History   Tobacco Use   Smoking status: Never   Smokeless tobacco: Never  Vaping Use   Vaping Use: Never used  Substance Use Topics   Alcohol use: Yes    Comment: occasional   Drug use: No   Social History   Socioeconomic History   Marital status: Divorced    Spouse name: Not on file   Number of children: 2   Years of education: Not on file   Highest education level: Associate degree: occupational, Hotel manager, or vocational program  Occupational History   Not on file  Tobacco Use   Smoking status: Never   Smokeless tobacco: Never  Vaping Use   Vaping Use: Never used  Substance and Sexual Activity   Alcohol use: Yes    Comment: occasional   Drug use: No   Sexual activity: Yes    Birth control/protection: Surgical  Other Topics Concern   Not on file  Social History Narrative   Not on file   Social Determinants of Health   Financial Resource Strain: Not on file  Food Insecurity: No  Food Insecurity (12/13/2020)   Hunger Vital Sign    Worried About Running Out of Food in the Last Year: Never true    Ran Out of Food in the Last Year: Never true  Transportation Needs: No Transportation Needs (12/13/2020)   PRAPARE - Hydrologist (Medical): No    Lack of Transportation (Non-Medical): No  Physical Activity: Not on file  Stress: Not on file  Social Connections: Not on file  Intimate Partner Violence: Not on file   Family Status  Relation Name Status   Mother  Alive   Father  Deceased   Mat Aunt  (Not Specified)   Ethlyn Daniels  (Not Specified)   Cousin female (Not Specified)   Cousin female 22   Family History   Problem Relation Age of Onset   Hypertension Mother    Diabetes Mother    Heart failure Mother    Hypertension Father    Breast cancer Maternal Aunt    Breast cancer Paternal Aunt    Breast cancer Cousin    Breast cancer Cousin    Allergies  Allergen Reactions   Lisinopril Cough      Review of Systems  Constitutional: Negative.  Negative for fatigue and weight loss.  HENT: Negative.    Eyes:  Negative for blurred vision.  Respiratory: Negative.    Cardiovascular:  Negative for chest pain.  Genitourinary: Negative.   Musculoskeletal: Negative.   Skin: Negative.   Neurological: Negative.  Negative for weakness.  Endo/Heme/Allergies:  Negative for polydipsia and polyphagia.  Psychiatric/Behavioral: Negative.        Objective:     BP (!) 159/88   Pulse 73   Temp 97.9 F (36.6 C)   Ht _0  (1.575 m)   Wt 253 lb 3.2 oz (114.9 kg)   SpO2 100%   BMI 46.31 kg/m  BP Readings from Last 3 Encounters:  02/11/22 (!) 159/88  11/26/21 (!) 170/98  08/06/21 (!) 155/101   Wt Readings from Last 3 Encounters:  02/11/22 253 lb 3.2 oz (114.9 kg)  11/26/21 265 lb 6.9 oz (120.4 kg)  08/06/21 265 lb 6.4 oz (120.4 kg)      Physical Exam Constitutional:      Appearance: Normal appearance.  Eyes:     Pupils: Pupils are equal, round, and reactive to light.  Cardiovascular:     Rate and Rhythm: Normal rate and regular rhythm.  Pulmonary:     Effort: Pulmonary effort is normal.  Abdominal:     General: Bowel sounds are normal.  Musculoskeletal:        General: Normal range of motion.  Skin:    General: Skin is warm.  Neurological:     General: No focal deficit present.     Mental Status: She is alert. Mental status is at baseline.  Psychiatric:        Mood and Affect: Mood normal.        Behavior: Behavior normal.        Thought Content: Thought content normal.        Judgment: Judgment normal.      Results for orders placed or performed in visit on 02/11/22   Results Console HPV  Result Value Ref Range   CHL HPV Negative   Results for orders placed or performed in visit on 02/11/22  POCT glycosylated hemoglobin (Hb A1C)  Result Value Ref Range   Hemoglobin A1C 6.5 (A) 4.0 - 5.6 %   HbA1c  POC (<> result, manual entry)     HbA1c, POC (prediabetic range)     HbA1c, POC (controlled diabetic range)      Last CBC Lab Results  Component Value Date   WBC 7.9 01/31/2020   HGB 12.2 01/31/2020   HCT 37.5 01/31/2020   MCV 78.6 (L) 01/31/2020   MCH 25.6 (L) 01/31/2020   RDW 15.6 (H) 01/31/2020   PLT 292 52/84/1324   Last metabolic panel Lab Results  Component Value Date   GLUCOSE 115 (H) 08/06/2021   NA 141 08/06/2021   K 3.5 08/06/2021   CL 103 08/06/2021   CO2 24 08/06/2021   BUN 12 08/06/2021   CREATININE 0.86 08/06/2021   EGFR 83 08/06/2021   CALCIUM 9.5 08/06/2021   PROT 6.7 08/06/2021   ALBUMIN 4.4 08/06/2021   LABGLOB 2.3 08/06/2021   AGRATIO 1.9 08/06/2021   BILITOT <0.2 08/06/2021   ALKPHOS 64 08/06/2021   AST 24 08/06/2021   ALT 24 08/06/2021   ANIONGAP 7 01/31/2020   Last lipids Lab Results  Component Value Date   CHOL 182 10/06/2018   HDL 60 10/06/2018   LDLCALC 108 (H) 10/06/2018   TRIG 72 10/06/2018   CHOLHDL 3.0 10/06/2018   Last hemoglobin A1c Lab Results  Component Value Date   HGBA1C 6.5 (A) 02/11/2022   Last thyroid functions Lab Results  Component Value Date   TSH 1.360 02/15/2019   Last vitamin D Lab Results  Component Value Date   VD25OH 14.3 (L) 10/06/2018   Last vitamin B12 and Folate No results found for: "VITAMINB12", "FOLATE"    The ASCVD Risk score (Arnett DK, et al., 2019) failed to calculate for the following reasons:   Cannot find a previous HDL lab   Cannot find a previous total cholesterol lab    Assessment & Plan:   Problem List Items Addressed This Visit       Cardiovascular and Mediastinum   Essential hypertension   Relevant Orders   Ambulatory referral to  Ophthalmology   Comprehensive metabolic panel     Other   Class 3 severe obesity due to excess calories with serious comorbidity in adult, unspecified BMI ([place])   Relevant Medications   Dulaglutide (TRULICITY) 3 MW/1.0UV SOPN   Other Visit Diagnoses     Type 2 diabetes mellitus without complication, without long-term current use of insulin ([place])    -  Primary   Relevant Medications   Dulaglutide (TRULICITY) 3 OZ/3.6UY SOPN   Other Relevant Orders   POCT glycosylated hemoglobin (Hb A1C) (Completed)   Ambulatory referral to Ophthalmology   Microalbumin/Creatinine Ratio, Urine   Comprehensive metabolic panel   Lipid Panel   Flu vaccine need       Relevant Orders   Flu Vaccine QUAD 85moIM (Fluarix, Fluzone & Alfiuria Quad PF) (Completed)   Colon cancer screening       Relevant Orders   Cologuard      1. Type 2 diabetes mellitus without complication, without long-term current use of insulin (HCC) Patient's hemoglobin A1c is 6.5, which is at goal.  Discussed the importance of continuing a carbohydrate modified diet in order to achieve positive outcomes. We will continue Trulicity and metformin for diabetes control. Goal weight loss is an additional 12 pounds by next follow-up visit. - POCT glycosylated hemoglobin (Hb A1C) - Ambulatory referral to Ophthalmology - Microalbumin/Creatinine Ratio, Urine - Dulaglutide (TRULICITY) 3 MQI/3.4VQSOPN; Inject 3 mg as directed once a week.  Dispense: 2 mL; Refill:  5 - Comprehensive metabolic panel - Lipid Panel  2. Class 3 severe obesity due to excess calories with serious comorbidity in adult, unspecified BMI ([place]) Patient has decreased her weight by 12 pounds over the past 6 months The patient is asked to make an attempt to improve diet and exercise patterns to aid in medical management of this problem.   3. Essential hypertension BP (!) 159/88   Pulse 73   Temp 97.9 F (36.6 C)   Ht _0  (1.575 m)   Wt 253 lb 3.2 oz (114.9 kg)    SpO2 100%   BMI 46.31 kg/m  Patient's blood pressure is above goal.  She states that she last had her blood pressure medication 30 minutes prior to arrival. - Ambulatory referral to Ophthalmology - Comprehensive metabolic panel  4. Flu vaccine need  - Flu Vaccine QUAD 26moIM (Fluarix, Fluzone & Alfiuria Quad PF)  5. Colon cancer screening  - Cologuard  Return in about 6 months (around 08/13/2022) for gynecological examination.   LDonia Pounds APRN, MSN, FNP-C Patient [facility] [phone]

## 2022-02-12 LAB — COMPREHENSIVE METABOLIC PANEL
ALT: 10 IU/L (ref 0–32)
AST: 11 IU/L (ref 0–40)
Albumin/Globulin Ratio: 1.6 (ref 1.2–2.2)
Albumin: 4.1 g/dL (ref 3.9–4.9)
Alkaline Phosphatase: 96 IU/L (ref 44–121)
BUN/Creatinine Ratio: 11 (ref 9–23)
BUN: 11 mg/dL (ref 6–24)
Bilirubin Total: <0.2 mg/dL (ref 0.0–1.2)
CO2: 26 mmol/L (ref 20–29)
Calcium: 9.7 mg/dL (ref 8.7–10.2)
Chloride: 104 mmol/L (ref 96–106)
Creatinine, Ser: 0.98 mg/dL (ref 0.57–1.00)
Globulin, Total: 2.6 g/dL (ref 1.5–4.5)
Glucose: 106 mg/dL — ABNORMAL HIGH (ref 70–99)
Potassium: 4.2 mmol/L (ref 3.5–5.2)
Sodium: 143 mmol/L (ref 134–144)
Total Protein: 6.7 g/dL (ref 6.0–8.5)
eGFR: 71 mL/min/{1.73_m2} (ref >59)

## 2022-02-12 LAB — LIPID PANEL
Chol/HDL Ratio: 3.7 ratio (ref 0.0–4.4)
Cholesterol, Total: 209 mg/dL — ABNORMAL HIGH (ref 100–199)
HDL: 57 mg/dL (ref >39)
LDL Chol Calc (NIH): 135 mg/dL — ABNORMAL HIGH (ref 0–99)
Triglycerides: 97 mg/dL (ref 0–149)
VLDL Cholesterol Cal: 17 mg/dL (ref 5–40)

## 2022-02-12 LAB — MICROALBUMIN / CREATININE URINE RATIO
Creatinine, Urine: 70.1 mg/dL
Microalb/Creat Ratio: <4 mg/g creat (ref 0–29)
Microalbumin, Urine: <3 ug/mL

## 2022-02-13 ENCOUNTER — Other Ambulatory Visit: Payer: Self-pay

## 2022-02-14 ENCOUNTER — Other Ambulatory Visit: Payer: Self-pay

## 2022-02-15 DIAGNOSIS — E119 Type 2 diabetes mellitus without complications: Secondary | ICD-10-CM | POA: Diagnosis not present

## 2022-02-15 DIAGNOSIS — I10 Essential (primary) hypertension: Secondary | ICD-10-CM | POA: Diagnosis not present

## 2022-02-15 DIAGNOSIS — Z7984 Long term (current) use of oral hypoglycemic drugs: Secondary | ICD-10-CM | POA: Diagnosis not present

## 2022-02-15 DIAGNOSIS — Z791 Long term (current) use of non-steroidal anti-inflammatories (NSAID): Secondary | ICD-10-CM | POA: Diagnosis not present

## 2022-02-15 DIAGNOSIS — Z7985 Long-term (current) use of injectable non-insulin antidiabetic drugs: Secondary | ICD-10-CM | POA: Diagnosis not present

## 2022-02-15 DIAGNOSIS — Z6841 Body Mass Index (BMI) 40.0 and over, adult: Secondary | ICD-10-CM | POA: Diagnosis not present

## 2022-02-15 DIAGNOSIS — R609 Edema, unspecified: Secondary | ICD-10-CM | POA: Diagnosis not present

## 2022-02-15 DIAGNOSIS — R69 Illness, unspecified: Secondary | ICD-10-CM | POA: Diagnosis not present

## 2022-02-15 DIAGNOSIS — M1990 Unspecified osteoarthritis, unspecified site: Secondary | ICD-10-CM | POA: Diagnosis not present

## 2022-02-15 DIAGNOSIS — M549 Dorsalgia, unspecified: Secondary | ICD-10-CM | POA: Diagnosis not present

## 2022-02-18 ENCOUNTER — Other Ambulatory Visit: Payer: Self-pay | Admitting: Family Medicine

## 2022-02-18 ENCOUNTER — Other Ambulatory Visit: Payer: Self-pay

## 2022-02-18 DIAGNOSIS — G8929 Other chronic pain: Secondary | ICD-10-CM

## 2022-02-20 ENCOUNTER — Other Ambulatory Visit: Payer: Self-pay

## 2022-02-20 MED ORDER — MELOXICAM 15 MG PO TABS
15.0000 mg | ORAL_TABLET | Freq: Every day | ORAL | 1 refills | Status: DC
  Filled 2022-02-20 – 2022-02-26 (×2): qty 30, 30d supply, fill #0
  Filled 2022-03-29: qty 30, 30d supply, fill #1
  Filled 2022-05-02: qty 30, 30d supply, fill #2
  Filled 2022-06-05: qty 30, 30d supply, fill #3
  Filled 2022-07-01 (×2): qty 30, 30d supply, fill #4
  Filled 2022-12-15: qty 30, 30d supply, fill #5

## 2022-02-20 NOTE — Telephone Encounter (Signed)
Please advise Kh 

## 2022-02-26 ENCOUNTER — Other Ambulatory Visit: Payer: Self-pay

## 2022-02-27 ENCOUNTER — Other Ambulatory Visit: Payer: Self-pay

## 2022-03-08 DIAGNOSIS — Z1211 Encounter for screening for malignant neoplasm of colon: Secondary | ICD-10-CM | POA: Diagnosis not present

## 2022-03-19 LAB — COLOGUARD: COLOGUARD: NEGATIVE

## 2022-03-19 NOTE — Progress Notes (Signed)
Melinda Ingram venously sent in sample for Cologuard.  Test yielded a negative result.  A negative Cologuard result indicates a low likelihood that colorectal cancer or advanced adenoma is present.  Will repeat this test in 3 years.  Please follow-up as scheduled.  [name]  APRN, MSN, FNP-C Patient [place] [address] [place], [place] [address] [phone]

## 2022-03-29 ENCOUNTER — Other Ambulatory Visit: Payer: Self-pay | Admitting: Family Medicine

## 2022-03-29 DIAGNOSIS — E119 Type 2 diabetes mellitus without complications: Secondary | ICD-10-CM

## 2022-03-31 ENCOUNTER — Other Ambulatory Visit: Payer: Self-pay

## 2022-04-01 ENCOUNTER — Other Ambulatory Visit: Payer: Self-pay

## 2022-04-01 MED ORDER — HYDROCHLOROTHIAZIDE 25 MG PO TABS
25.0000 mg | ORAL_TABLET | Freq: Every day | ORAL | 1 refills | Status: DC
  Filled 2022-04-01: qty 30, 30d supply, fill #0
  Filled 2022-05-02: qty 30, 30d supply, fill #1
  Filled 2022-06-05: qty 30, 30d supply, fill #2
  Filled 2022-07-01 (×2): qty 30, 30d supply, fill #3
  Filled 2022-08-19: qty 30, 30d supply, fill #4
  Filled 2022-09-29: qty 30, 30d supply, fill #5

## 2022-04-01 MED ORDER — METFORMIN HCL 500 MG PO TABS
500.0000 mg | ORAL_TABLET | Freq: Two times a day (BID) | ORAL | 2 refills | Status: DC
  Filled 2022-04-01: qty 60, 30d supply, fill #0
  Filled 2022-06-25: qty 60, 30d supply, fill #1
  Filled 2022-08-19: qty 60, 30d supply, fill #2

## 2022-04-04 ENCOUNTER — Other Ambulatory Visit: Payer: Self-pay

## 2022-05-02 ENCOUNTER — Other Ambulatory Visit: Payer: Self-pay

## 2022-05-06 ENCOUNTER — Other Ambulatory Visit: Payer: Self-pay

## 2022-06-06 ENCOUNTER — Other Ambulatory Visit: Payer: Self-pay

## 2022-06-25 ENCOUNTER — Other Ambulatory Visit: Payer: Self-pay

## 2022-06-26 ENCOUNTER — Other Ambulatory Visit (HOSPITAL_COMMUNITY): Payer: Self-pay

## 2022-06-26 ENCOUNTER — Other Ambulatory Visit: Payer: Self-pay

## 2022-06-26 DIAGNOSIS — L659 Nonscarring hair loss, unspecified: Secondary | ICD-10-CM | POA: Diagnosis not present

## 2022-06-26 MED ORDER — HALOBETASOL PROPIONATE 0.05 % EX OINT
1.0000 | TOPICAL_OINTMENT | Freq: Every day | CUTANEOUS | 3 refills | Status: DC
  Filled 2022-06-26: qty 50, 30d supply, fill #0

## 2022-06-27 ENCOUNTER — Other Ambulatory Visit (HOSPITAL_COMMUNITY): Payer: Self-pay

## 2022-06-30 ENCOUNTER — Other Ambulatory Visit: Payer: Self-pay

## 2022-06-30 MED ORDER — HALOBETASOL PROPIONATE 0.05 % EX OINT
1.0000 | TOPICAL_OINTMENT | Freq: Every day | CUTANEOUS | 3 refills | Status: DC
  Filled 2022-06-30: qty 50, 30d supply, fill #0

## 2022-07-01 ENCOUNTER — Other Ambulatory Visit: Payer: Self-pay

## 2022-07-04 ENCOUNTER — Other Ambulatory Visit: Payer: Self-pay

## 2022-07-09 ENCOUNTER — Other Ambulatory Visit: Payer: Self-pay

## 2022-07-10 ENCOUNTER — Other Ambulatory Visit: Payer: Self-pay

## 2022-07-10 ENCOUNTER — Other Ambulatory Visit (HOSPITAL_COMMUNITY): Payer: Self-pay

## 2022-07-10 DIAGNOSIS — L578 Other skin changes due to chronic exposure to nonionizing radiation: Secondary | ICD-10-CM | POA: Diagnosis not present

## 2022-07-25 DIAGNOSIS — F325 Major depressive disorder, single episode, in full remission: Secondary | ICD-10-CM | POA: Diagnosis not present

## 2022-07-25 DIAGNOSIS — M1990 Unspecified osteoarthritis, unspecified site: Secondary | ICD-10-CM | POA: Diagnosis not present

## 2022-07-25 DIAGNOSIS — Z7984 Long term (current) use of oral hypoglycemic drugs: Secondary | ICD-10-CM | POA: Diagnosis not present

## 2022-07-25 DIAGNOSIS — Z6841 Body Mass Index (BMI) 40.0 and over, adult: Secondary | ICD-10-CM | POA: Diagnosis not present

## 2022-07-25 DIAGNOSIS — H409 Unspecified glaucoma: Secondary | ICD-10-CM | POA: Diagnosis not present

## 2022-07-25 DIAGNOSIS — I10 Essential (primary) hypertension: Secondary | ICD-10-CM | POA: Diagnosis not present

## 2022-07-25 DIAGNOSIS — Z7985 Long-term (current) use of injectable non-insulin antidiabetic drugs: Secondary | ICD-10-CM | POA: Diagnosis not present

## 2022-07-25 DIAGNOSIS — R32 Unspecified urinary incontinence: Secondary | ICD-10-CM | POA: Diagnosis not present

## 2022-07-25 DIAGNOSIS — K5900 Constipation, unspecified: Secondary | ICD-10-CM | POA: Diagnosis not present

## 2022-07-25 DIAGNOSIS — E119 Type 2 diabetes mellitus without complications: Secondary | ICD-10-CM | POA: Diagnosis not present

## 2022-07-25 DIAGNOSIS — Z8249 Family history of ischemic heart disease and other diseases of the circulatory system: Secondary | ICD-10-CM | POA: Diagnosis not present

## 2022-08-08 ENCOUNTER — Telehealth: Payer: Self-pay

## 2022-08-08 NOTE — Telephone Encounter (Signed)
Called pt to find out if she would be able to have her eye exam done here 11/06/22 or if she has a eye doctor already. No answer and could not leave voice mail.   KH

## 2022-08-18 ENCOUNTER — Telehealth: Payer: Self-pay

## 2022-08-18 ENCOUNTER — Other Ambulatory Visit: Payer: Self-pay

## 2022-08-18 NOTE — Progress Notes (Signed)
   Melinda Ingram [birth year] [id number]  Patient outreached by Mack Guise , PharmD Candidate on 08/18/2022.  Blood Pressure Readings: Last documented ambulatory systolic blood pressure: 159 Last documented ambulatory diastolic blood pressure: 88 Does the patient have a validated home blood pressure machine?: Yes They report home readings NA  Medication review was performed. Is the patient taking their medications as prescribed?: Yes Differences from their prescribed list include: NA  The following barriers to adherence were noted: Does the patient have cost concerns?: No Does the patient have transportation concerns?: No Does the patient need assistance obtaining refills?: No Does the patient occassionally forget to take some of their prescribed medications?: No Does the patient feel like one/some of their medications make them feel poorly?: No Does the patient have questions or concerns about their medications?: No Does the patient have a follow up scheduled with their primary care provider/cardiologist?: Yes   Interventions: Interventions Completed: Medications were reviewed, Patient was educated on goal blood pressures and long term health implications of elevated blood pressure, Patient was educated on proper technique to check home blood pressure and reminded to bring home machine and readings to next provider appointment  The patient has follow up scheduled:  PCP: Massie Maroon, FNP   Mack Guise, Student-PharmD

## 2022-08-19 ENCOUNTER — Other Ambulatory Visit: Payer: Self-pay | Admitting: Family Medicine

## 2022-08-19 ENCOUNTER — Ambulatory Visit (INDEPENDENT_AMBULATORY_CARE_PROVIDER_SITE_OTHER): Payer: 59 | Admitting: Family Medicine

## 2022-08-19 ENCOUNTER — Other Ambulatory Visit: Payer: Self-pay

## 2022-08-19 VITALS — BP 139/82 | HR 85 | Temp 97.3°F | Wt 248.8 lb

## 2022-08-19 DIAGNOSIS — E119 Type 2 diabetes mellitus without complications: Secondary | ICD-10-CM | POA: Diagnosis not present

## 2022-08-19 DIAGNOSIS — F3289 Other specified depressive episodes: Secondary | ICD-10-CM

## 2022-08-19 DIAGNOSIS — I10 Essential (primary) hypertension: Secondary | ICD-10-CM | POA: Diagnosis not present

## 2022-08-19 LAB — POCT GLYCOSYLATED HEMOGLOBIN (HGB A1C): Hemoglobin A1C: 6.4 % — AB (ref 4.0–5.6)

## 2022-08-19 MED ORDER — BUPROPION HCL ER (XL) 150 MG PO TB24
150.0000 mg | ORAL_TABLET | Freq: Every day | ORAL | 11 refills | Status: DC
  Filled 2022-08-19: qty 30, 30d supply, fill #0
  Filled 2022-12-15: qty 30, 30d supply, fill #1

## 2022-08-19 MED ORDER — TRULICITY 3 MG/0.5ML ~~LOC~~ SOAJ: 3.0000 mg | SUBCUTANEOUS | 5 refills | Status: DC

## 2022-08-19 NOTE — Telephone Encounter (Signed)
Please advise KH 

## 2022-08-19 NOTE — Progress Notes (Signed)
Established Patient Office Visit  Subjective   Patient ID: Melinda Ingram, female    DOB: 22-Jul-1975  Age: 47 y.o. MRN: [id number]  Chief Complaint  Patient presents with  . Diabetes  . Hypertension    Melinda Ingram is a very pleasant 47 year old female with a medical history significant for   Diabetes She presents for her follow-up diabetic visit. She has type 2 diabetes mellitus. Her disease course has been stable. Pertinent negatives for diabetes include no blurred vision, no chest pain, no fatigue, no foot paresthesias, no polydipsia, no polyphagia, no polyuria, no weakness and no weight loss. Symptoms are stable. Risk factors for coronary artery disease include diabetes mellitus and dyslipidemia. She is compliant with treatment all of the time. She does not see a podiatrist.Eye exam is not current.  Hypertension Pertinent negatives include no blurred vision or chest pain.    Patient Active Problem List   Diagnosis Date Noted  . Class 3 severe obesity due to excess calories with serious comorbidity in adult, unspecified BMI (HCC) 02/14/2020  . Pain of joint of left ankle and foot 05/28/2016  . Elevated blood pressure reading 05/28/2016  . Foot erythema 05/28/2016  . Essential hypertension 05/28/2016   Past Medical History:  Diagnosis Date  . Anxiety   . Arthritis   . Diabetes mellitus without complication (HCC)    type 2  . Dyspnea    walking   . Gallstone   . Headache    occasional  . History of kidney stones   . Hypertension    Stopped Lisinopril because of side effects.   Past Surgical History:  Procedure Laterality Date  . cells frozen     dysplasia  . CHOLECYSTECTOMY     01/28/17 Dr. Fredricka Bonine  . CHOLECYSTECTOMY N/A 01/28/2017   Procedure: LAPAROSCOPIC CHOLECYSTECTOMY;  Surgeon: Berna Bue, MD;  Location: [facility];  Service: General;  Laterality: N/A;  . TUBAL LIGATION     Social History   Tobacco Use  . Smoking status: Never  . Smokeless  tobacco: Never  Vaping Use  . Vaping Use: Never used  Substance Use Topics  . Alcohol use: Yes    Comment: occasional  . Drug use: No   Social History   Socioeconomic History  . Marital status: Divorced    Spouse name: Not on file  . Number of children: 2  . Years of education: Not on file  . Highest education level: Associate degree: occupational, Scientist, product/process development, or vocational program  Occupational History  . Not on file  Tobacco Use  . Smoking status: Never  . Smokeless tobacco: Never  Vaping Use  . Vaping Use: Never used  Substance and Sexual Activity  . Alcohol use: Yes    Comment: occasional  . Drug use: No  . Sexual activity: Yes    Birth control/protection: Surgical  Other Topics Concern  . Not on file  Social History Narrative  . Not on file   Social Determinants of Health   Financial Resource Strain: Not on file  Food Insecurity: No Food Insecurity (12/13/2020)   Hunger Vital Sign   . Worried About Programme researcher, broadcasting/film/video in the Last Year: Never true   . Ran Out of Food in the Last Year: Never true  Transportation Needs: No Transportation Needs (12/13/2020)   PRAPARE - Transportation   . Lack of Transportation (Medical): No   . Lack of Transportation (Non-Medical): No  Physical Activity: Not on file  Stress: Not on file  Social Connections: Not on file  Intimate Partner Violence: Not on file   Family Status  Relation Name Status  . Mother  Alive  . Father  Deceased  . Mat Aunt  (Not Specified)  . Emelda Brothers  (Not Specified)  . Cousin female (Not Specified)  . Cousin female Alive   Family History  Problem Relation Age of Onset  . Hypertension Mother   . Diabetes Mother   . Heart failure Mother   . Hypertension Father   . Breast cancer Maternal Aunt   . Breast cancer Paternal Aunt   . Breast cancer Cousin   . Breast cancer Cousin    Allergies  Allergen Reactions  . Lisinopril Cough      Review of Systems  Constitutional: Negative.  Negative for  fatigue and weight loss.  HENT: Negative.    Eyes: Negative.  Negative for blurred vision.  Cardiovascular: Negative.  Negative for chest pain.  Gastrointestinal: Negative.   Genitourinary: Negative.   Musculoskeletal:  Positive for back pain and joint pain.  Skin: Negative.   Neurological: Negative.  Negative for weakness.  Endo/Heme/Allergies: Negative.  Negative for polydipsia and polyphagia.  Psychiatric/Behavioral: Negative.        Objective:     BP 139/82   Pulse 85   Temp (!) 97.3 F (36.3 C)   Wt 248 lb 12.8 oz (112.9 kg)   SpO2 100%   BMI 45.51 kg/m  BP Readings from Last 3 Encounters:  08/19/22 139/82  02/11/22 (!) 159/88  11/26/21 (!) 170/98   Wt Readings from Last 3 Encounters:  08/19/22 248 lb 12.8 oz (112.9 kg)  02/11/22 253 lb 3.2 oz (114.9 kg)  11/26/21 265 lb 6.9 oz (120.4 kg)      Physical Exam Constitutional:      Appearance: She is obese.  Eyes:     Pupils: Pupils are equal, round, and reactive to light.  Cardiovascular:     Rate and Rhythm: Normal rate and regular rhythm.     Pulses: Normal pulses.  Pulmonary:     Effort: Pulmonary effort is normal.  Abdominal:     General: Bowel sounds are normal.  Musculoskeletal:        General: Normal range of motion.  Skin:    General: Skin is warm.  Neurological:     General: No focal deficit present.     Mental Status: Mental status is at baseline.  Psychiatric:        Mood and Affect: Mood normal.        Behavior: Behavior normal.        Thought Content: Thought content normal.        Judgment: Judgment normal.     Results for orders placed or performed in visit on 08/19/22  POCT glycosylated hemoglobin (Hb A1C)  Result Value Ref Range   Hemoglobin A1C 6.4 (A) 4.0 - 5.6 %   HbA1c POC (<> result, manual entry)     HbA1c, POC (prediabetic range)     HbA1c, POC (controlled diabetic range)      Last CBC Lab Results  Component Value Date   WBC 7.9 01/31/2020   HGB 12.2 01/31/2020    HCT 37.5 01/31/2020   MCV 78.6 (L) 01/31/2020   MCH 25.6 (L) 01/31/2020   RDW 15.6 (H) 01/31/2020   PLT 292 01/31/2020   Last metabolic panel Lab Results  Component Value Date   GLUCOSE 106 (H) 02/11/2022   NA  143 02/11/2022   K 4.2 02/11/2022   CL 104 02/11/2022   CO2 26 02/11/2022   BUN 11 02/11/2022   CREATININE 0.98 02/11/2022   EGFR 71 02/11/2022   CALCIUM 9.7 02/11/2022   PROT 6.7 02/11/2022   ALBUMIN 4.1 02/11/2022   LABGLOB 2.6 02/11/2022   AGRATIO 1.6 02/11/2022   BILITOT <0.2 02/11/2022   ALKPHOS 96 02/11/2022   AST 11 02/11/2022   ALT 10 02/11/2022   ANIONGAP 7 01/31/2020   Last lipids Lab Results  Component Value Date   CHOL 209 (H) 02/11/2022   HDL 57 02/11/2022   LDLCALC 135 (H) 02/11/2022   TRIG 97 02/11/2022   CHOLHDL 3.7 02/11/2022   Last hemoglobin A1c Lab Results  Component Value Date   HGBA1C 6.4 (A) 08/19/2022   Last thyroid functions Lab Results  Component Value Date   TSH 1.360 02/15/2019   Last vitamin D Lab Results  Component Value Date   VD25OH 14.3 (L) 10/06/2018   Last vitamin B12 and Folate No results found for: "VITAMINB12", "FOLATE"    The 10-year ASCVD risk score (Arnett DK, et al., 2019) is: 10.3%    Assessment & Plan:   Problem List Items Addressed This Visit       Cardiovascular and Mediastinum   Essential hypertension     Other   Class 3 severe obesity due to excess calories with serious comorbidity in adult, unspecified BMI (HCC)   Relevant Medications   Dulaglutide (TRULICITY) 3 MG/0.5ML SOPN   Other Visit Diagnoses     Type 2 diabetes mellitus without complication, without long-term current use of insulin (HCC)    -  Primary   Relevant Medications   Dulaglutide (TRULICITY) 3 MG/0.5ML SOPN   Other Relevant Orders   POCT glycosylated hemoglobin (Hb A1C) (Completed)   CMP and Liver       Return in about 6 months (around 02/18/2023) for diabetes, obesity.   Nolon Nations  APRN, MSN,  FNP-C Patient Care [facility] [address] [place], [place] [address] [phone]

## 2022-08-20 ENCOUNTER — Ambulatory Visit: Payer: Self-pay | Admitting: Nurse Practitioner

## 2022-08-20 ENCOUNTER — Encounter: Payer: Self-pay | Admitting: Family Medicine

## 2022-08-20 LAB — CMP AND LIVER
ALT: 7 IU/L (ref 0–32)
AST: 10 IU/L (ref 0–40)
Albumin: 4.2 g/dL (ref 3.9–4.9)
Alkaline Phosphatase: 92 IU/L (ref 44–121)
BUN: 11 mg/dL (ref 6–24)
Bilirubin Total: <0.2 mg/dL (ref 0.0–1.2)
Bilirubin, Direct: <0.1 mg/dL (ref 0.00–0.40)
CO2: 24 mmol/L (ref 20–29)
Calcium: 9.6 mg/dL (ref 8.7–10.2)
Chloride: 103 mmol/L (ref 96–106)
Creatinine, Ser: 0.89 mg/dL (ref 0.57–1.00)
Glucose: 110 mg/dL — ABNORMAL HIGH (ref 70–99)
Potassium: 4 mmol/L (ref 3.5–5.2)
Sodium: 143 mmol/L (ref 134–144)
Total Protein: 7 g/dL (ref 6.0–8.5)
eGFR: 79 mL/min/{1.73_m2} (ref >59)

## 2022-08-27 ENCOUNTER — Ambulatory Visit (INDEPENDENT_AMBULATORY_CARE_PROVIDER_SITE_OTHER): Payer: 59 | Admitting: Nurse Practitioner

## 2022-08-27 ENCOUNTER — Encounter: Payer: Self-pay | Admitting: Nurse Practitioner

## 2022-08-27 ENCOUNTER — Other Ambulatory Visit (HOSPITAL_COMMUNITY)
Admission: RE | Admit: 2022-08-27 | Discharge: 2022-08-27 | Disposition: A | Discharge status: Home / Self Care | Payer: 59 | Source: Ambulatory Visit | Attending: Nurse Practitioner | Admitting: Nurse Practitioner

## 2022-08-27 VITALS — BP 138/80 | HR 85 | Temp 97.2°F | Wt 245.2 lb

## 2022-08-27 DIAGNOSIS — Z124 Encounter for screening for malignant neoplasm of cervix: Secondary | ICD-10-CM | POA: Diagnosis not present

## 2022-08-27 DIAGNOSIS — I10 Essential (primary) hypertension: Secondary | ICD-10-CM

## 2022-08-27 NOTE — Progress Notes (Signed)
Established Patient Office Visit  Subjective:  Patient ID: Melinda Ingram, female    DOB: August 14, 1975  Age: 47 y.o. MRN: [id number]  CC: No chief complaint on file.   HPI Melinda Ingram is a 47 y.o. female  has a past medical history of Anxiety, Arthritis, Diabetes mellitus without complication (HCC), Dyspnea, Gallstone, Headache, History of kidney stones, and Hypertension.   Patient presents for cervical Pap exam.  She reports occasional vaginal itching no discharge, redness, abdominal pain.  She is up-to-date with mammogram  Patient denies any adverse reactions to current medications   .  Past Medical History:  Diagnosis Date   Anxiety    Arthritis    Diabetes mellitus without complication (HCC)    type 2   Dyspnea    walking    Gallstone    Headache    occasional   History of kidney stones    Hypertension    Stopped Lisinopril because of side effects.    Past Surgical History:  Procedure Laterality Date   cells frozen     dysplasia   CHOLECYSTECTOMY     01/28/17 Dr. Fredricka Bonine   CHOLECYSTECTOMY N/A 01/28/2017   Procedure: LAPAROSCOPIC CHOLECYSTECTOMY;  Surgeon: Berna Bue, MD;  Location: [facility];  Service: General;  Laterality: N/A;   TUBAL LIGATION      Family History  Problem Relation Age of Onset   Hypertension Mother    Diabetes Mother    Heart failure Mother    Hypertension Father    Breast cancer Maternal Aunt    Breast cancer Paternal Aunt    Breast cancer Cousin    Breast cancer Cousin     Social History   Socioeconomic History   Marital status: Divorced    Spouse name: Not on file   Number of children: 2   Years of education: Not on file   Highest education level: Associate degree: occupational, Scientist, product/process development, or vocational program  Occupational History   Not on file  Tobacco Use   Smoking status: Never   Smokeless tobacco: Never  Vaping Use   Vaping Use: Never used  Substance and Sexual Activity   Alcohol use: Yes     Comment: occasional   Drug use: No   Sexual activity: Yes    Birth control/protection: Surgical  Other Topics Concern   Not on file  Social History Narrative   Not on file   Social Determinants of Health   Financial Resource Strain: Not on file  Food Insecurity: No Food Insecurity (12/13/2020)   Hunger Vital Sign    Worried About Running Out of Food in the Last Year: Never true    Ran Out of Food in the Last Year: Never true  Transportation Needs: No Transportation Needs (12/13/2020)   PRAPARE - Administrator, Civil Service (Medical): No    Lack of Transportation (Non-Medical): No  Physical Activity: Not on file  Stress: Not on file  Social Connections: Not on file  Intimate Partner Violence: Not on file    Outpatient Medications Prior to Visit  Medication Sig Dispense Refill   buPROPion (WELLBUTRIN XL) 150 MG 24 hr tablet Take 1 tablet (150 mg total) by mouth daily. 30 tablet 11   Dulaglutide (TRULICITY) 3 MG/0.5ML SOPN Inject 3 mg as directed once a week. 2 mL 5   hydrochlorothiazide (HYDRODIURIL) 25 MG tablet Take 1 tablet (25 mg total) by mouth once daily. 90 tablet 1   latanoprost (XALATAN) 0.005 %  ophthalmic solution Instill 1 drop Both Eyes at bedtime 2.5 mL 11   metFORMIN (GLUCOPHAGE) 500 MG tablet Take 1 tablet (500 mg total) by mouth 2 (two) times daily with a meal. 60 tablet 2   Blood Glucose Monitoring Suppl (BLOOD GLUCOSE MONITOR SYSTEM) w/Device KIT use as directed (Patient not taking: Reported on 08/06/2021) 1 kit 0   halobetasol (ULTRAVATE) 0.05 % ointment Apply 1 Application to affected areas on scalp daily. (Patient not taking: Reported on 08/19/2022) 50 g 3   halobetasol (ULTRAVATE) 0.05 % ointment Apply topically to affected areas of scalp daily. (Patient not taking: Reported on 08/19/2022) 50 g 3   meloxicam (MOBIC) 15 MG tablet Take 1 tablet (15 mg total) by mouth daily. (Patient not taking: Reported on 08/27/2022) 90 tablet 1   methocarbamol  (ROBAXIN) 500 MG tablet Take 1 tablet (500 mg total) by mouth 2 (two) times daily. (Patient not taking: Reported on 02/11/2022) 20 tablet 0   naproxen (NAPROSYN) 500 MG tablet Take 1 tablet (500 mg total) by mouth 2 (two) times daily. (Patient not taking: Reported on 02/11/2022) 30 tablet 0   methylPREDNISolone (MEDROL DOSEPAK) 4 MG TBPK tablet Take as directed (Patient not taking: Reported on 02/11/2022) 21 tablet 0   No facility-administered medications prior to visit.    Allergies  Allergen Reactions   Lisinopril Cough    ROS Review of Systems  Constitutional:  Negative for activity change, appetite change, chills, fatigue and fever.  HENT:  Negative for congestion, dental problem, ear discharge, ear pain, hearing loss, rhinorrhea, sinus pressure, sinus pain, sneezing and sore throat.   Eyes:  Negative for pain, discharge, redness and itching.  Respiratory:  Negative for cough, chest tightness, shortness of breath and wheezing.   Cardiovascular:  Negative for chest pain, palpitations and leg swelling.  Gastrointestinal:  Negative for abdominal distention, abdominal pain, anal bleeding, blood in stool, constipation, diarrhea, nausea, rectal pain and vomiting.  Endocrine: Negative for cold intolerance, heat intolerance, polydipsia, polyphagia and polyuria.  Genitourinary:  Negative for difficulty urinating, dysuria, flank pain, frequency, hematuria, menstrual problem, pelvic pain and vaginal bleeding.  Musculoskeletal:  Negative for arthralgias, back pain, gait problem, joint swelling and myalgias.  Skin:  Negative for color change, pallor, rash and wound.  Allergic/Immunologic: Negative for environmental allergies, food allergies and immunocompromised state.  Neurological:  Negative for dizziness, tremors, facial asymmetry, weakness and headaches.  Hematological:  Negative for adenopathy. Does not bruise/bleed easily.  Psychiatric/Behavioral:  Negative for agitation, behavioral problems,  confusion, decreased concentration, hallucinations, self-injury and suicidal ideas.       Objective:    Physical Exam Vitals and nursing note reviewed. Exam conducted with a chaperone present.  Constitutional:      General: She is not in acute distress.    Appearance: Normal appearance. She is obese. She is not ill-appearing, toxic-appearing or diaphoretic.  HENT:     Mouth/Throat:     Mouth: Mucous membranes are moist.     Pharynx: Oropharynx is clear. No oropharyngeal exudate or posterior oropharyngeal erythema.  Eyes:     General: No scleral icterus.       Right eye: No discharge.        Left eye: No discharge.     Extraocular Movements: Extraocular movements intact.     Conjunctiva/sclera: Conjunctivae normal.  Cardiovascular:     Rate and Rhythm: Normal rate and regular rhythm.     Pulses: Normal pulses.     Heart sounds: Normal heart sounds. No  murmur heard.    No friction rub. No gallop.  Pulmonary:     Effort: Pulmonary effort is normal. No respiratory distress.     Breath sounds: Normal breath sounds. No stridor. No wheezing, rhonchi or rales.  Chest:     Chest wall: No mass, lacerations, deformity, swelling, tenderness or edema.  Breasts:    Tanner Score is 5.     Breasts are symmetrical.     Right: Normal. No swelling, bleeding, inverted nipple, mass, nipple discharge, skin change or tenderness.     Left: Normal. No swelling, bleeding, inverted nipple, mass, nipple discharge, skin change or tenderness.  Abdominal:     General: There is no distension.     Palpations: Abdomen is soft.     Tenderness: There is no abdominal tenderness. There is no right CVA tenderness, left CVA tenderness or guarding.     Hernia: There is no hernia in the left inguinal area or right inguinal area.  Genitourinary:    General: Normal vulva.     Exam position: Lithotomy position.     Pubic Area: No rash or pubic lice.      Tanner stage (genital): 5.     Labia:        Right: No rash,  tenderness, lesion or injury.        Left: No rash, tenderness, lesion or injury.      Urethra: No prolapse, urethral pain, urethral swelling or urethral lesion.     Vagina: No signs of injury and foreign body. No vaginal discharge, erythema, tenderness, bleeding, lesions or prolapsed vaginal walls.     Cervix: No cervical motion tenderness, discharge, friability, lesion, erythema, cervical bleeding or eversion.     Uterus: Normal. Not enlarged, not fixed, not tender and no uterine prolapse.      Adnexa:        Right: No mass, tenderness or fullness.         Left: No mass, tenderness or fullness.    Musculoskeletal:        General: No swelling, tenderness, deformity or signs of injury.     Right lower leg: No edema.     Left lower leg: No edema.  Lymphadenopathy:     Upper Body:     Right upper body: No supraclavicular, axillary or pectoral adenopathy.     Left upper body: No supraclavicular, axillary or pectoral adenopathy.     Lower Body: No right inguinal adenopathy. No left inguinal adenopathy.  Skin:    General: Skin is warm and dry.     Capillary Refill: Capillary refill takes 2 to 3 seconds.     Coloration: Skin is not jaundiced or pale.     Findings: No bruising, erythema or lesion.  Neurological:     Mental Status: She is alert and oriented to person, place, and time.     Motor: No weakness.     Coordination: Coordination normal.     Gait: Gait normal.  Psychiatric:        Mood and Affect: Mood normal.        Behavior: Behavior normal.        Thought Content: Thought content normal.        Judgment: Judgment normal.     BP 138/80   Pulse 85   Temp (!) 97.2 F (36.2 C)   Wt 245 lb 3.2 oz (111.2 kg)   SpO2 100%   BMI 44.85 kg/m  Wt Readings from Last 3 Encounters:  08/27/22 245 lb 3.2 oz (111.2 kg)  08/19/22 248 lb 12.8 oz (112.9 kg)  02/11/22 253 lb 3.2 oz (114.9 kg)    Lab Results  Component Value Date   TSH 1.360 02/15/2019   Lab Results  Component  Value Date   WBC 7.9 01/31/2020   HGB 12.2 01/31/2020   HCT 37.5 01/31/2020   MCV 78.6 (L) 01/31/2020   PLT 292 01/31/2020   Lab Results  Component Value Date   NA 143 08/19/2022   K 4.0 08/19/2022   CO2 24 08/19/2022   GLUCOSE 110 (H) 08/19/2022   BUN 11 08/19/2022   CREATININE 0.89 08/19/2022   BILITOT <0.2 08/19/2022   ALKPHOS 92 08/19/2022   AST 10 08/19/2022   ALT 7 08/19/2022   PROT 7.0 08/19/2022   ALBUMIN 4.2 08/19/2022   CALCIUM 9.6 08/19/2022   ANIONGAP 7 01/31/2020   EGFR 79 08/19/2022   Lab Results  Component Value Date   CHOL 209 (H) 02/11/2022   Lab Results  Component Value Date   HDL 57 02/11/2022   Lab Results  Component Value Date   LDLCALC 135 (H) 02/11/2022   Lab Results  Component Value Date   TRIG 97 02/11/2022   Lab Results  Component Value Date   CHOLHDL 3.7 02/11/2022   Lab Results  Component Value Date   HGBA1C 6.4 (A) 08/19/2022      Assessment & Plan:   Problem List Items Addressed This Visit       Cardiovascular and Mediastinum   Essential hypertension    BP Readings from Last 3 Encounters:  08/27/22 138/80  08/19/22 139/82  02/11/22 (!) 159/88   HTN On hydrochlorothiazide 25 mg daily Continue current medications.  DASH diet and dietary sodium restrictions encouraged increase dietary efforts and exercise.           Other   Screening for cervical cancer - Primary    Procedure completed without difficulty Sample sent to the lab for testing      Relevant Orders   Cytology - PAP([place])    No orders of the defined types were placed in this encounter.   Follow-up: No follow-ups on file.    Donell Beers, FNP

## 2022-08-27 NOTE — Patient Instructions (Signed)

## 2022-08-27 NOTE — Assessment & Plan Note (Addendum)
BP Readings from Last 3 Encounters:  08/27/22 138/80  08/19/22 139/82  02/11/22 (!) 159/88   HTN On hydrochlorothiazide 25 mg daily Continue current medications.  DASH diet and dietary sodium restrictions encouraged increase dietary efforts and exercise.

## 2022-08-27 NOTE — Assessment & Plan Note (Signed)
Procedure completed without difficulty Sample sent to the lab for testing

## 2022-09-08 LAB — CYTOLOGY - PAP
Chlamydia: NEGATIVE
Comment: NEGATIVE
Comment: NEGATIVE
Comment: NEGATIVE
Comment: NORMAL
Diagnosis: NEGATIVE
High risk HPV: NEGATIVE
Neisseria Gonorrhea: NEGATIVE
Trichomonas: NEGATIVE

## 2022-09-29 ENCOUNTER — Other Ambulatory Visit: Payer: Self-pay

## 2022-09-29 ENCOUNTER — Other Ambulatory Visit: Payer: Self-pay | Admitting: Family Medicine

## 2022-09-29 DIAGNOSIS — E119 Type 2 diabetes mellitus without complications: Secondary | ICD-10-CM

## 2022-09-29 MED ORDER — METFORMIN HCL 500 MG PO TABS
500.0000 mg | ORAL_TABLET | Freq: Two times a day (BID) | ORAL | 2 refills | Status: DC
  Filled 2022-09-29: qty 60, 30d supply, fill #0
  Filled 2022-12-15 (×2): qty 60, 30d supply, fill #1

## 2022-09-30 ENCOUNTER — Other Ambulatory Visit: Payer: Self-pay

## 2022-09-30 ENCOUNTER — Other Ambulatory Visit (HOSPITAL_COMMUNITY): Payer: Self-pay

## 2022-11-08 ENCOUNTER — Other Ambulatory Visit: Payer: Self-pay | Admitting: Family Medicine

## 2022-11-08 DIAGNOSIS — E119 Type 2 diabetes mellitus without complications: Secondary | ICD-10-CM

## 2022-11-10 ENCOUNTER — Other Ambulatory Visit: Payer: Self-pay

## 2022-11-10 MED ORDER — HYDROCHLOROTHIAZIDE 25 MG PO TABS
25.0000 mg | ORAL_TABLET | Freq: Every day | ORAL | 1 refills | Status: DC
  Filled 2022-11-10: qty 30, 30d supply, fill #0
  Filled 2022-12-15 (×2): qty 30, 30d supply, fill #1
  Filled 2023-01-20: qty 30, 30d supply, fill #2
  Filled 2023-03-03: qty 30, 30d supply, fill #3
  Filled 2023-04-21: qty 30, 30d supply, fill #4
  Filled 2023-05-26: qty 30, 30d supply, fill #5

## 2022-11-12 ENCOUNTER — Other Ambulatory Visit: Payer: Self-pay

## 2022-11-14 ENCOUNTER — Other Ambulatory Visit: Payer: Self-pay

## 2022-11-27 ENCOUNTER — Other Ambulatory Visit: Payer: Self-pay

## 2022-12-01 DIAGNOSIS — M25562 Pain in left knee: Secondary | ICD-10-CM | POA: Diagnosis not present

## 2022-12-04 ENCOUNTER — Ambulatory Visit: Payer: 59 | Admitting: Allergy

## 2022-12-04 ENCOUNTER — Other Ambulatory Visit: Payer: Self-pay

## 2022-12-04 ENCOUNTER — Encounter: Payer: Self-pay | Admitting: Allergy

## 2022-12-04 VITALS — BP 140/82 | HR 74 | Temp 98.3°F | Ht 62.0 in | Wt 243.9 lb

## 2022-12-04 DIAGNOSIS — J302 Other seasonal allergic rhinitis: Secondary | ICD-10-CM

## 2022-12-04 DIAGNOSIS — J3089 Other allergic rhinitis: Secondary | ICD-10-CM | POA: Diagnosis not present

## 2022-12-04 DIAGNOSIS — L299 Pruritus, unspecified: Secondary | ICD-10-CM | POA: Diagnosis not present

## 2022-12-04 DIAGNOSIS — L659 Nonscarring hair loss, unspecified: Secondary | ICD-10-CM | POA: Diagnosis not present

## 2022-12-04 DIAGNOSIS — H1013 Acute atopic conjunctivitis, bilateral: Secondary | ICD-10-CM | POA: Diagnosis not present

## 2022-12-04 DIAGNOSIS — K9049 Malabsorption due to intolerance, not elsewhere classified: Secondary | ICD-10-CM

## 2022-12-04 MED ORDER — RYALTRIS 665-25 MCG/ACT NA SUSP: 2.0000 | Freq: Two times a day (BID) | NASAL | 1 refills | Status: DC

## 2022-12-04 MED ORDER — OLOPATADINE HCL 0.2 % OP SOLN
1.0000 [drp] | OPHTHALMIC | 5 refills | Status: DC
  Filled 2022-12-04: qty 2.5, 30d supply, fill #0
  Filled 2023-05-05: qty 2.5, 25d supply, fill #0
  Filled 2023-10-16: qty 2.5, 25d supply, fill #1

## 2022-12-04 MED ORDER — FEXOFENADINE HCL 180 MG PO TABS
180.0000 mg | ORAL_TABLET | Freq: Every day | ORAL | 5 refills | Status: DC
  Filled 2022-12-04: qty 30, 30d supply, fill #0

## 2022-12-04 NOTE — Patient Instructions (Addendum)
Chronic Itching Predominantly scalp itching with some generalized itching. No visible or palpable rashes. Itching has improved with reduction in dairy intake. Possible association with stress. 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 - Perform allergy testing for environmental and common food allergens. - Testing today showed: ragweed, weeds, trees, indoor molds, and outdoor molds - Food allergy testing is negative including milk - Copy of test results provided.  - Avoidance measures provided. - Start taking: Allegra (fexofenadine) 180mg  tablet once daily - You can use an extra dose of the antihistamine, if needed, for breakthrough symptoms.  - Moisturize daily after bathing with an unscented, dye-free moisturizing agent  Allergic Rhinitis Year-round symptoms of runny nose, stuffy nose, itchy/watery eyes, and sneezing. Symptoms worse in spring. Difficulty breathing at night due to nasal congestion. - Testing as above - Antihistamine as above - Start use of Ryaltris nasal spray 2 sprays each nostril twice a day as needed for runny or stuffy nose.  - Start use of Pataday 1 drop each eye daily as needed for itch/watery eyes.  - Consider nasal saline rinses 3-7 days a week to remove allergens from the nasal cavities as well as help with mucous clearance (this is especially helpful to do before the nasal sprays are given).  - With using nasal sprays point tip of bottle toward eye on same side nostril and lean head slightly forward for best technique.   - Consider allergy shots as a means of long-term control. - Allergy shots "re-train" and "reset" the immune system to ignore environmental allergens and decrease the resulting immune response to those allergens (sneezing, itchy watery eyes, runny nose, nasal congestion, etc).    - Allergy shots improve symptoms in 80-85% of patients.  - We can discuss more at a future appointment if the medications are not working for you. -  Consider referral to ENT if symptoms do not improve with recommended treatments.  Lactose Intolerance Diarrhea after consuming dairy.  - Performed allergy testing for dairy as above - Recommend use of either lactose-free dairy products or you can take a lactase enzyme pill prior to dairy ingestion to help prevent symptoms  Alopecia - Advise to follow-up with dermatologist for management of alopecia.  Follow-up in 3-4 months or sooner if needed

## 2022-12-04 NOTE — Progress Notes (Signed)
New Patient Note  RE: Melinda Ingram MRN: [id number] DOB: Sep 18, 1975 Date of Office Visit: 12/04/2022 Primary care provider: Massie Maroon, FNP  Chief Complaint: itching  History of present illness: Melinda Ingram is a 47 y.o. female presenting today for evaluation of itch and allergies.   Discussed the use of AI scribe software for clinical note transcription with the patient, who gave verbal consent to proceed.  The patient, with a primary care provider of Sander Nephew, presents for her first allergist consultation due to persistent itching, primarily of the scalp, but also generalized over the body. The itching began in 2016 and has progressively worsened over the past three months, to the point of causing significant distress. The patient reports some flaking of the scalp but denies any noticeable rashes or bumps on the body. Scratching does not result in any noticeable skin changes. The itching was a daily occurrence but has reduced to a couple of times a month since the patient reduced her dairy intake, based on a suggestion from a relative.  To manage the itching, the patient has been applying water and oil to the scalp. She has not taken any antihistamines or other allergy medications. The patient also reports hair loss and has seen a dermatologist for this issue. The dermatologist diagnosed some form of alopecia and recommended a cream for treatment, which the patient has not been consistently using. A biopsy was performed in May of the current year. The patient has not followed up with the dermatologist since then.  In addition to the itching, the patient reports year-round symptoms of a runny nose, stuffy nose, itchy and watery eyes, and sneezing, which worsen in the spring. The patient uses a saline spray by [employer] to manage the nasal congestion, particularly at night. She has not used any medicated nasal sprays. The patient also reports frequent headaches and  pressure, suggestive of possible sinus infections, but has not sought medical attention for these symptoms.  The patient denies any history of asthma or food allergies, but reports diarrhea after consuming milk/dairy, suggesting possible lactose intolerance. There is no history of eczema.     Review of systems: 10pt ROS negative unless noted above in HPI  Past medical history: Past Medical History:  Diagnosis Date   Anxiety    Arthritis    Diabetes mellitus without complication (HCC)    type 2   Dyspnea    walking    Gallstone    Headache    occasional   History of kidney stones    Hypertension    Stopped Lisinopril because of side effects.    Past surgical history: Past Surgical History:  Procedure Laterality Date   cells frozen     dysplasia   CHOLECYSTECTOMY     01/28/17 Dr. Fredricka Bonine   CHOLECYSTECTOMY N/A 01/28/2017   Procedure: LAPAROSCOPIC CHOLECYSTECTOMY;  Surgeon: Berna Bue, MD;  Location: [facility];  Service: General;  Laterality: N/A;   TUBAL LIGATION      Family history:  Family History  Problem Relation Age of Onset   Allergic rhinitis Mother    Hypertension Mother    Diabetes Mother    Heart failure Mother    Hypertension Father    Allergic rhinitis Maternal Aunt    Breast cancer Maternal Aunt    Allergic rhinitis Maternal Uncle    Allergic rhinitis Paternal Aunt    Breast cancer Paternal Aunt    Allergic rhinitis Paternal Uncle  Breast cancer Cousin    Breast cancer Cousin     Social history: Lives in a apartment without carpeting with gas heating and central cooling.  Dog sometimes in home.  There is concern for water damage, mildew or roaches in the home.  She is a Lawyer.  Does not report smoking history.    Medication List: Current Outpatient Medications  Medication Sig Dispense Refill   Blood Glucose Monitoring Suppl (BLOOD GLUCOSE MONITOR SYSTEM) w/Device KIT use as directed 1 kit 0   hydrochlorothiazide (HYDRODIURIL) 25 MG tablet  Take 1 tablet (25 mg total) by mouth once daily. 90 tablet 1   latanoprost (XALATAN) 0.005 % ophthalmic solution Instill 1 drop Both Eyes at bedtime 2.5 mL 11   metFORMIN (GLUCOPHAGE) 500 MG tablet Take 1 tablet (500 mg total) by mouth 2 (two) times daily with a meal. 60 tablet 2   methocarbamol (ROBAXIN) 500 MG tablet Take 1 tablet (500 mg total) by mouth 2 (two) times daily. 20 tablet 0   naproxen (NAPROSYN) 500 MG tablet Take 1 tablet (500 mg total) by mouth 2 (two) times daily. 30 tablet 0   buPROPion (WELLBUTRIN XL) 150 MG 24 hr tablet Take 1 tablet (150 mg total) by mouth daily. (Patient not taking: Reported on 12/04/2022) 30 tablet 11   Dulaglutide (TRULICITY) 3 MG/0.5ML SOPN Inject 3 mg as directed once a week. (Patient not taking: Reported on 12/04/2022) 2 mL 5   halobetasol (ULTRAVATE) 0.05 % ointment Apply 1 Application to affected areas on scalp daily. (Patient not taking: Reported on 08/19/2022) 50 g 3   halobetasol (ULTRAVATE) 0.05 % ointment Apply topically to affected areas of scalp daily. (Patient not taking: Reported on 08/19/2022) 50 g 3   meloxicam (MOBIC) 15 MG tablet Take 1 tablet (15 mg total) by mouth daily. (Patient not taking: Reported on 08/27/2022) 90 tablet 1   No current facility-administered medications for this visit.    Known medication allergies: Allergies  Allergen Reactions   Lisinopril Cough     Physical examination: Blood pressure (!) 148/92, pulse 74, temperature 98.3 F (36.8 C), temperature source Temporal, height 5\' 2"  (1.575 m), weight 243 lb 14.4 oz (110.6 kg), SpO2 97%.  General: Alert, interactive, in no acute distress. HEENT: PERRLA, TMs pearly gray, turbinates moderately edematous without discharge, post-pharynx non erythematous. Neck: Supple without lymphadenopathy. Lungs: Clear to auscultation without wheezing, rhonchi or rales. {no increased work of breathing. CV: Normal S1, S2 without murmurs. Abdomen: Nondistended, nontender. Skin: Mild  hair loss at crown . Extremities:  No clubbing, cyanosis or edema. Neuro:   Grossly intact.  Diagnositics/Labs:  Allergy testing:   Airborne Adult Perc - 12/04/22 0900     Time Antigen Placed 4098    Location Back    Number of Test 55    1. Control-Buffer 50% Glycerol Negative    2. Control-Histamine 2+    3. Bahia Negative    4. French [place] Negative    5. Johnson Negative    6. Kentucky Blue Negative    7. Meadow Fescue Negative    8. Perennial Rye Negative    9. Timothy Negative    10. Ragweed Mix 2+    11. Cocklebur 2+    12. Plantain,  English Negative    13. Baccharis Negative    14. Dog Fennel 3+    15. Russian Thistle 3+    16. Lamb's Quarters 3+    17. Sheep Sorrell 2+    18. Rough Pigweed 2+  19. Marsh Elder, Rough Negative    20. Mugwort, Common Negative    21. Box, Elder 2+    22. Cedar, red Negative    23. Sweet Gum Negative    24. Pecan Pollen 2+    25. Pine Mix Negative    26. Walnut, Black Pollen Negative    27. Red Mulberry 2+    28. Ash Mix Negative    29. Birch Mix 2+    30. Beech American 3+    31. Cottonwood, [place] 2+    32. Hickory, White Negative    33. Maple Mix Negative    34. Oak, [place] Mix Negative    35. Sycamore Eastern Negative    36. Alternaria Alternata Negative    37. Cladosporium Herbarum 2+    38. Aspergillus Mix 2+    39. Penicillium Mix Negative    40. Bipolaris Sorokiniana (Helminthosporium) 3+    41. Drechslera Spicifera (Curvularia) Negative    42. Mucor Plumbeus Negative    43. Fusarium Moniliforme Negative    44. Aureobasidium Pullulans (pullulara) Negative    45. Rhizopus Oryzae Negative    46. Botrytis Cinera Negative    47. Epicoccum Nigrum Negative    48. Phoma Betae Negative    49. Dust Mite Mix Negative    50. Cat Hair 10,000 BAU/ml Negative    51.  Dog Epithelia Negative    52. Mixed Feathers Negative    53. Horse Epithelia Negative    54. Cockroach, German Negative    55. Tobacco Leaf Negative              13 Food Perc - 12/04/22 0900       Test Information   Time Antigen Placed 4696    Allergen Manufacturer Waynette Buttery    Location Back    Number of allergen test 13      Food   1. Peanut Negative    2. Soybean Negative    3. Wheat Negative    4. Sesame Negative    5. Milk, Cow Negative    6. Casein Negative    7. Egg White, Chicken Negative    8. Shellfish Mix Negative    9. Fish Mix Negative    10. Cashew Negative    11. Walnut Food Negative    12. Almond Negative    13. Hazelnut Negative             Allergy testing results were read and interpreted by provider, documented by clinical staff.   Assessment and plan: Chronic Itching Predominantly scalp itching with some generalized itching. No visible or palpable rashes. Itching has improved with reduction in dairy intake. Possible association with stress. 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 - Perform allergy testing for environmental and common food allergens. - Testing today showed: ragweed, weeds, trees, indoor molds, and outdoor molds - Food allergy testing is negative including milk - Copy of test results provided.  - Avoidance measures provided. - Start taking: Allegra (fexofenadine) 180mg  tablet once daily - You can use an extra dose of the antihistamine, if needed, for breakthrough symptoms.  - Moisturize daily after bathing with an unscented, dye-free moisturizing agent  Allergic Rhinitis with conjunctivitis Year-round symptoms of runny nose, stuffy nose, itchy/watery eyes, and sneezing. Symptoms worse in spring. Difficulty breathing at night due to nasal congestion. - Testing as above - Antihistamine as above - Start use of Ryaltris nasal spray 2 sprays each nostril twice a day as needed  for runny or stuffy nose.  - Start use of Pataday 1 drop each eye daily as needed for itch/watery eyes.  - Consider nasal saline rinses 3-7 days a week to remove allergens from the nasal cavities  as well as help with mucous clearance (this is especially helpful to do before the nasal sprays are given).  - With using nasal sprays point tip of bottle toward eye on same side nostril and lean head slightly forward for best technique.   - Consider allergy shots as a means of long-term control. - Allergy shots "re-train" and "reset" the immune system to ignore environmental allergens and decrease the resulting immune response to those allergens (sneezing, itchy watery eyes, runny nose, nasal congestion, etc).    - Allergy shots improve symptoms in 80-85% of patients.  - We can discuss more at a future appointment if the medications are not working for you. - Consider referral to ENT if symptoms do not improve with recommended treatments.  Lactose Intolerance Diarrhea after consuming dairy.  - Performed allergy testing for dairy as above - Recommend use of either lactose-free dairy products or you can take a lactase enzyme pill prior to dairy ingestion to help prevent symptoms  Alopecia - Advise to follow-up with dermatologist for management of alopecia.  Follow-up in 3-4 months or sooner if needed   I appreciate the opportunity to take part in Vedanshi's care. Please do not hesitate to contact me with questions.  Sincerely,   Margo Aye, MD Allergy/Immunology [facility]

## 2022-12-15 ENCOUNTER — Other Ambulatory Visit: Payer: Self-pay

## 2022-12-17 ENCOUNTER — Other Ambulatory Visit: Payer: Self-pay

## 2022-12-29 ENCOUNTER — Other Ambulatory Visit (HOSPITAL_BASED_OUTPATIENT_CLINIC_OR_DEPARTMENT_OTHER): Payer: Self-pay

## 2023-01-09 ENCOUNTER — Other Ambulatory Visit: Payer: Self-pay | Admitting: Allergy

## 2023-01-15 ENCOUNTER — Other Ambulatory Visit: Payer: Self-pay

## 2023-01-15 DIAGNOSIS — H1045 Other chronic allergic conjunctivitis: Secondary | ICD-10-CM | POA: Diagnosis not present

## 2023-01-15 DIAGNOSIS — H02822 Cysts of right lower eyelid: Secondary | ICD-10-CM | POA: Diagnosis not present

## 2023-01-15 DIAGNOSIS — H40053 Ocular hypertension, bilateral: Secondary | ICD-10-CM | POA: Diagnosis not present

## 2023-01-15 DIAGNOSIS — H02825 Cysts of left lower eyelid: Secondary | ICD-10-CM | POA: Diagnosis not present

## 2023-01-15 DIAGNOSIS — H04123 Dry eye syndrome of bilateral lacrimal glands: Secondary | ICD-10-CM | POA: Diagnosis not present

## 2023-01-15 MED ORDER — LATANOPROST 0.005 % OP SOLN
1.0000 [drp] | Freq: Every day | OPHTHALMIC | 11 refills | Status: DC
  Filled 2023-01-15: qty 2.5, 25d supply, fill #0
  Filled 2023-02-09: qty 2.5, 25d supply, fill #1
  Filled 2023-03-03: qty 2.5, 25d supply, fill #2
  Filled 2023-05-05: qty 2.5, 25d supply, fill #3

## 2023-01-20 ENCOUNTER — Other Ambulatory Visit: Payer: Self-pay | Admitting: Family Medicine

## 2023-01-20 ENCOUNTER — Other Ambulatory Visit: Payer: Self-pay

## 2023-01-20 DIAGNOSIS — G8929 Other chronic pain: Secondary | ICD-10-CM

## 2023-01-20 MED ORDER — MELOXICAM 15 MG PO TABS
15.0000 mg | ORAL_TABLET | Freq: Every day | ORAL | 1 refills | Status: DC
  Filled 2023-01-20: qty 30, 30d supply, fill #0
  Filled 2023-03-03: qty 30, 30d supply, fill #1

## 2023-01-21 ENCOUNTER — Other Ambulatory Visit: Payer: Self-pay

## 2023-02-09 ENCOUNTER — Other Ambulatory Visit: Payer: Self-pay

## 2023-02-12 DIAGNOSIS — M25462 Effusion, left knee: Secondary | ICD-10-CM | POA: Diagnosis not present

## 2023-02-17 ENCOUNTER — Ambulatory Visit: Payer: Self-pay | Admitting: Family Medicine

## 2023-02-18 ENCOUNTER — Encounter: Payer: Self-pay | Admitting: Nurse Practitioner

## 2023-02-18 ENCOUNTER — Ambulatory Visit (INDEPENDENT_AMBULATORY_CARE_PROVIDER_SITE_OTHER): Payer: 59 | Admitting: Nurse Practitioner

## 2023-02-18 ENCOUNTER — Other Ambulatory Visit: Payer: Self-pay

## 2023-02-18 VITALS — BP 151/78 | HR 73 | Temp 97.6°F | Wt 247.0 lb

## 2023-02-18 DIAGNOSIS — E1165 Type 2 diabetes mellitus with hyperglycemia: Secondary | ICD-10-CM | POA: Insufficient documentation

## 2023-02-18 DIAGNOSIS — B353 Tinea pedis: Secondary | ICD-10-CM | POA: Insufficient documentation

## 2023-02-18 DIAGNOSIS — E66813 Obesity, class 3: Secondary | ICD-10-CM | POA: Diagnosis not present

## 2023-02-18 DIAGNOSIS — Z23 Encounter for immunization: Secondary | ICD-10-CM | POA: Diagnosis not present

## 2023-02-18 DIAGNOSIS — I10 Essential (primary) hypertension: Secondary | ICD-10-CM | POA: Diagnosis not present

## 2023-02-18 DIAGNOSIS — E785 Hyperlipidemia, unspecified: Secondary | ICD-10-CM

## 2023-02-18 HISTORY — DX: Hyperlipidemia, unspecified: E78.5

## 2023-02-18 LAB — POCT GLYCOSYLATED HEMOGLOBIN (HGB A1C): Hemoglobin A1C: 6.6 % — AB (ref 4.0–5.6)

## 2023-02-18 MED ORDER — LOSARTAN POTASSIUM 25 MG PO TABS
25.0000 mg | ORAL_TABLET | Freq: Every day | ORAL | 0 refills | Status: DC
  Filled 2023-02-18: qty 30, 30d supply, fill #0
  Filled 2023-03-28: qty 30, 30d supply, fill #1

## 2023-02-18 MED ORDER — TRULICITY 0.75 MG/0.5ML ~~LOC~~ SOAJ
0.7500 mg | SUBCUTANEOUS | 0 refills | Status: DC
  Filled 2023-02-18: qty 2, 28d supply, fill #0

## 2023-02-18 MED ORDER — METFORMIN HCL 500 MG PO TABS: 500.0000 mg | ORAL_TABLET | Freq: Every day | ORAL | 2 refills | Status: DC

## 2023-02-18 MED ORDER — CLOTRIMAZOLE 1 % EX CREA
1.0000 | TOPICAL_CREAM | Freq: Two times a day (BID) | CUTANEOUS | 1 refills | Status: DC
  Filled 2023-02-18: qty 30, 15d supply, fill #0
  Filled 2023-03-03: qty 30, 15d supply, fill #1

## 2023-02-18 NOTE — Progress Notes (Signed)
Established Patient Office Visit  Subjective:  Patient ID: Melinda Ingram, female    DOB: [birth year]  Age: 47 y.o. MRN: [id number]  CC:  Chief Complaint  Patient presents with   Diabetes   Obesity    HPI Melinda Ingram is a 47 y.o. female  has a past medical history of Anxiety, Arthritis, Class 3 severe obesity due to excess calories with serious comorbidity in adult, unspecified BMI (HCC) (02/14/2020), Diabetes mellitus without complication (HCC), Dyslipidemia, goal LDL below 70 (02/18/2023), Dyspnea, Gallstone, Headache, History of kidney stones, and Hypertension.  Patient presents for follow-up for her chronic medical conditions.  Patient of Julianne Handler NP  Hypertension.  Currently on hydrochlorothiazide 25 mg daily. Denies CP, SOB, edema   Type 2 diabetes.  Currently on , metformin 500 mg once daily. Not taking trulicity for the past 4 months due to cost , she has been cutting down on sweets , bread but does not exercise.  Not on a statin . Does not check CBG, able to tell the signs of hypoglycemia,She denies symptoms of hypoglycemia    Past Medical History:  Diagnosis Date   Anxiety    Arthritis    Class 3 severe obesity due to excess calories with serious comorbidity in adult, unspecified BMI (HCC) 02/14/2020   Diabetes mellitus without complication (HCC)    type 2   Dyslipidemia, goal LDL below 70 02/18/2023   Dyspnea    walking    Gallstone    Headache    occasional   History of kidney stones    Hypertension    Stopped Lisinopril because of side effects.    Past Surgical History:  Procedure Laterality Date   cells frozen     dysplasia   CHOLECYSTECTOMY     01/28/17 Dr. Fredricka Bonine   CHOLECYSTECTOMY N/A 01/28/2017   Procedure: LAPAROSCOPIC CHOLECYSTECTOMY;  Surgeon: Berna Bue, MD;  Location: [facility];  Service: General;  Laterality: N/A;   TUBAL LIGATION      Family History  Problem Relation Age of Onset   Allergic rhinitis Mother     Hypertension Mother    Diabetes Mother    Heart failure Mother    Hypertension Father    Allergic rhinitis Maternal Aunt    Breast cancer Maternal Aunt    Allergic rhinitis Maternal Uncle    Allergic rhinitis Paternal Aunt    Breast cancer Paternal Aunt    Allergic rhinitis Paternal Uncle    Breast cancer Cousin    Breast cancer Cousin     Social History   Socioeconomic History   Marital status: Divorced    Spouse name: Not on file   Number of children: 2   Years of education: Not on file   Highest education level: Associate degree: occupational, Scientist, product/process development, or vocational program  Occupational History   Not on file  Tobacco Use   Smoking status: Never    Passive exposure: Past (Mother and Father)   Smokeless tobacco: Never  Vaping Use   Vaping status: Never Used  Substance and Sexual Activity   Alcohol use: Yes    Comment: occasional   Drug use: No   Sexual activity: Yes    Birth control/protection: Surgical  Other Topics Concern   Not on file  Social History Narrative   Not on file   Social Drivers of Health   Financial Resource Strain: Not on file  Food Insecurity: No Food Insecurity (12/13/2020)   Hunger Vital Sign  Worried About Programme researcher, broadcasting/film/video in the Last Year: Never true    Ran Out of Food in the Last Year: Never true  Transportation Needs: No Transportation Needs (12/13/2020)   PRAPARE - Administrator, Civil Service (Medical): No    Lack of Transportation (Non-Medical): No  Physical Activity: Not on file  Stress: Not on file  Social Connections: Not on file  Intimate Partner Violence: Not on file    Outpatient Medications Prior to Visit  Medication Sig Dispense Refill   Blood Glucose Monitoring Suppl (BLOOD GLUCOSE MONITOR SYSTEM) w/Device KIT use as directed 1 kit 0   fexofenadine (ALLEGRA) 180 MG tablet Take 1 tablet (180 mg total) by mouth daily. 30 tablet 5   hydrochlorothiazide (HYDRODIURIL) 25 MG tablet Take 1 tablet (25 mg  total) by mouth once daily. 90 tablet 1   latanoprost (XALATAN) 0.005 % ophthalmic solution Instill 1 drop Both Eyes at bedtime 2.5 mL 11   latanoprost (XALATAN) 0.005 % ophthalmic solution Place 1 drop into both eyes at bedtime. 2.5 mL 11   meloxicam (MOBIC) 15 MG tablet Take 1 tablet (15 mg total) by mouth daily. 90 tablet 1   Nutritional Supplements (EAA SUPPLEMENT PO) Take by mouth.     RYALTRIS 665-25 MCG/ACT SUSP INSTILL 2 SPRAYS INTO THE NOSE IN THE MORNING AND AT BEDTIME. 29 g 1   metFORMIN (GLUCOPHAGE) 500 MG tablet Take 1 tablet (500 mg total) by mouth 2 (two) times daily with a meal. 60 tablet 2   buPROPion (WELLBUTRIN XL) 150 MG 24 hr tablet Take 1 tablet (150 mg total) by mouth daily. (Patient not taking: Reported on 02/18/2023) 30 tablet 11   halobetasol (ULTRAVATE) 0.05 % ointment Apply 1 Application to affected areas on scalp daily. (Patient not taking: Reported on 02/18/2023) 50 g 3   halobetasol (ULTRAVATE) 0.05 % ointment Apply topically to affected areas of scalp daily. (Patient not taking: Reported on 02/18/2023) 50 g 3   Olopatadine HCl (PATADAY) 0.2 % SOLN Place 1 drop into both eyes 1 day or 1 dose. (Patient not taking: Reported on 02/18/2023) 2.5 mL 5   Dulaglutide (TRULICITY) 3 MG/0.5ML SOPN Inject 3 mg as directed once a week. (Patient not taking: Reported on 02/18/2023) 2 mL 5   methocarbamol (ROBAXIN) 500 MG tablet Take 1 tablet (500 mg total) by mouth 2 (two) times daily. (Patient not taking: Reported on 02/18/2023) 20 tablet 0   naproxen (NAPROSYN) 500 MG tablet Take 1 tablet (500 mg total) by mouth 2 (two) times daily. (Patient not taking: Reported on 02/18/2023) 30 tablet 0   No facility-administered medications prior to visit.    Allergies  Allergen Reactions   Lisinopril Cough    ROS Review of Systems  Constitutional:  Negative for appetite change, chills, fatigue and fever.  HENT:  Negative for congestion, postnasal drip, rhinorrhea and sneezing.    Respiratory:  Negative for cough, shortness of breath and wheezing.   Cardiovascular:  Negative for chest pain, palpitations and leg swelling.  Gastrointestinal:  Negative for abdominal pain, constipation, nausea and vomiting.  Genitourinary:  Negative for difficulty urinating, dysuria, flank pain and frequency.  Musculoskeletal:  Negative for arthralgias, back pain, joint swelling and myalgias.  Skin:  Negative for color change, pallor, rash and wound.  Neurological:  Negative for dizziness, facial asymmetry, weakness, numbness and headaches.  Psychiatric/Behavioral:  Negative for behavioral problems, confusion, self-injury and suicidal ideas.       Objective:  Physical Exam Vitals and nursing note reviewed.  Constitutional:      General: She is not in acute distress.    Appearance: Normal appearance. She is obese. She is not ill-appearing, toxic-appearing or diaphoretic.  HENT:     Mouth/Throat:     Mouth: Mucous membranes are moist.     Pharynx: Oropharynx is clear. No oropharyngeal exudate or posterior oropharyngeal erythema.  Eyes:     General: No scleral icterus.       Right eye: No discharge.        Left eye: No discharge.     Extraocular Movements: Extraocular movements intact.     Conjunctiva/sclera: Conjunctivae normal.  Cardiovascular:     Rate and Rhythm: Normal rate and regular rhythm.     Pulses: Normal pulses.     Heart sounds: Normal heart sounds. No murmur heard.    No friction rub. No gallop.  Pulmonary:     Effort: Pulmonary effort is normal. No respiratory distress.     Breath sounds: Normal breath sounds. No stridor. No wheezing, rhonchi or rales.  Chest:     Chest wall: No tenderness.  Abdominal:     General: There is no distension.     Palpations: Abdomen is soft.     Tenderness: There is no abdominal tenderness. There is no right CVA tenderness, left CVA tenderness or guarding.  Musculoskeletal:        General: No swelling, tenderness, deformity  or signs of injury.     Right lower leg: No edema.     Left lower leg: No edema.  Skin:    General: Skin is warm and dry.     Capillary Refill: Capillary refill takes less than 2 seconds.     Coloration: Skin is not jaundiced or pale.     Findings: No bruising, erythema or lesion.     Comments: Tenia pedis noted on bilateral heels.  No redness or swelling noted  Neurological:     Mental Status: She is alert and oriented to person, place, and time.     Motor: No weakness.     Coordination: Coordination normal.     Gait: Gait normal.  Psychiatric:        Mood and Affect: Mood normal.        Behavior: Behavior normal.        Thought Content: Thought content normal.        Judgment: Judgment normal.     BP (!) 151/78 (Patient Position: Sitting)   Pulse 73   Temp 97.6 F (36.4 C)   Wt 247 lb (112 kg)   SpO2 100%   BMI 45.18 kg/m  Wt Readings from Last 3 Encounters:  02/18/23 247 lb (112 kg)  12/04/22 243 lb 14.4 oz (110.6 kg)  08/27/22 245 lb 3.2 oz (111.2 kg)    Lab Results  Component Value Date   TSH 1.360 02/15/2019   Lab Results  Component Value Date   WBC 7.9 01/31/2020   HGB 12.2 01/31/2020   HCT 37.5 01/31/2020   MCV 78.6 (L) 01/31/2020   PLT 292 01/31/2020   Lab Results  Component Value Date   NA 143 08/19/2022   K 4.0 08/19/2022   CO2 24 08/19/2022   GLUCOSE 110 (H) 08/19/2022   BUN 11 08/19/2022   CREATININE 0.89 08/19/2022   BILITOT <0.2 08/19/2022   ALKPHOS 92 08/19/2022   AST 10 08/19/2022   ALT 7 08/19/2022   PROT 7.0 08/19/2022  ALBUMIN 4.2 08/19/2022   CALCIUM 9.6 08/19/2022   ANIONGAP 7 01/31/2020   EGFR 79 08/19/2022   Lab Results  Component Value Date   CHOL 209 (H) 02/11/2022   Lab Results  Component Value Date   HDL 57 02/11/2022   Lab Results  Component Value Date   LDLCALC 135 (H) 02/11/2022   Lab Results  Component Value Date   TRIG 97 02/11/2022   Lab Results  Component Value Date   CHOLHDL 3.7 02/11/2022   Lab  Results  Component Value Date   HGBA1C 6.4 (A) 08/19/2022      Assessment & Plan:   Problem List Items Addressed This Visit       Cardiovascular and Mediastinum   Essential hypertension - Primary   BP Readings from Last 3 Encounters:  02/18/23 (!) 151/78  12/04/22 (!) 140/82  08/27/22 138/80  Blood pressure goal is less than 130/80 encouraged to monitor blood pressure at home and keep a log Continue hydrochlorothiazide 25 mg daily, start losartan 25 mg daily HTN Controlled . Continue current medications. No changes in management. Discussed DASH diet and dietary sodium restrictions Continue to increase dietary efforts and exercise.  CMP today BMP in 2 weeks Follow-up in the office in 4 weeks      Relevant Medications   losartan (COZAAR) 25 MG tablet   Other Relevant Orders   CMP14+EGFR   Basic metabolic panel     Endocrine   Controlled type 2 diabetes mellitus with hyperglycemia, without long-term current use of insulin (HCC)   Diabetes is well-controlled Currently on metformin 500 mg daily She would like to restart Trulicity, Trulicity 0.75 mg once weekly injection ordered Patient counseled on low-carb modified diets Encouraged engage in regular moderate to vigorous exercise at least 150 minutes weekly Diabetic foot exam completed Up-to-date with diabetic eye exam we will get records Checking urine microalbumin      Relevant Medications   losartan (COZAAR) 25 MG tablet   Dulaglutide (TRULICITY) 0.75 MG/0.5ML SOAJ   metFORMIN (GLUCOPHAGE) 500 MG tablet   Other Relevant Orders   POCT glycosylated hemoglobin (Hb A1C)   Microalbumin/Creatinine Ratio, Urine     Musculoskeletal and Integument   Tinea pedis of both feet    - clotrimazole (LOTRIMIN) 1 % cream; Apply 1 Application topically 2 (two) times daily.  Dispense: 30 g; Refill: 1       Relevant Medications   clotrimazole (LOTRIMIN) 1 % cream     Other   Class 3 severe obesity due to excess calories with  serious comorbidity in adult, unspecified BMI (HCC)   Wt Readings from Last 3 Encounters:  02/18/23 247 lb (112 kg)  12/04/22 243 lb 14.4 oz (110.6 kg)  08/27/22 245 lb 3.2 oz (111.2 kg)   Body mass index is 45.18 kg/m.  Patient counseled on low-carb diet Encouraged engage in regular moderate to vigorous exercise at least 150 minutes weekly Benefits of healthy weights discussed      Relevant Medications   Dulaglutide (TRULICITY) 0.75 MG/0.5ML SOAJ   metFORMIN (GLUCOPHAGE) 500 MG tablet   Dyslipidemia, goal LDL below 70   Lab Results  Component Value Date   CHOL 209 (H) 02/11/2022   HDL 57 02/11/2022   LDLCALC 135 (H) 02/11/2022   TRIG 97 02/11/2022   CHOLHDL 3.7 02/11/2022  Currently not on a statin LDL goal is less than 70 Checking lipid panel today      Relevant Medications   losartan (COZAAR) 25 MG tablet  Other Relevant Orders   Lipid panel   Need for influenza vaccination   Relevant Orders   Flu vaccine trivalent PF, 6mos and older(Flulaval,Afluria,Fluarix,Fluzone) (Completed)    Meds ordered this encounter  Medications   losartan (COZAAR) 25 MG tablet    Sig: Take 1 tablet (25 mg total) by mouth daily.    Dispense:  60 tablet    Refill:  0   Dulaglutide (TRULICITY) 0.75 MG/0.5ML SOAJ    Sig: Inject 0.75 mg into the skin once a week.    Dispense:  2 mL    Refill:  0   metFORMIN (GLUCOPHAGE) 500 MG tablet    Sig: Take 1 tablet (500 mg total) by mouth daily with breakfast.    Dispense:  60 tablet    Refill:  2   clotrimazole (LOTRIMIN) 1 % cream    Sig: Apply 1 Application topically 2 (two) times daily.    Dispense:  30 g    Refill:  1    Follow-up: Return in about 6 weeks (around 04/01/2023) for HTN, HYPERLIPIDEMIA.    Donell Beers, FNP

## 2023-02-18 NOTE — Patient Instructions (Addendum)
Around 3 times per week, check your blood pressure 2 times per day. once in the morning and once in the evening. The readings should be at least one minute apart. Write down these values and bring them to your next nurse visit/appointment.  When you check your BP, make sure you have been doing something calm/relaxing 5 minutes prior to checking. Both feet should be flat on the floor and you should be sitting. Use your left arm and make sure it is in a relaxed position (on a table), and that the cuff is at the approximate level/height of your heart.   Blood pressure goal is less than 130/80     1. Essential hypertension (Primary)  - CMP14+EGFR - losartan (COZAAR) 25 MG tablet; Take 1 tablet (25 mg total) by mouth daily.  Dispense: 60 tablet; Refill: 0 - Basic metabolic panel; Future  2. Class 3 severe obesity due to excess calories with serious comorbidity in adult, unspecified BMI (HCC)  - Dulaglutide (TRULICITY) 0.75 MG/0.5ML SOAJ; Inject 0.75 mg into the skin once a week.  Dispense: 2 mL; Refill: 0  3. Dyslipidemia, goal LDL below 70  - Lipid panel  4. Controlled type 2 diabetes mellitus with hyperglycemia, without long-term current use of insulin (HCC)  - POCT glycosylated hemoglobin (Hb A1C) - Microalbumin/Creatinine Ratio, Urine - Dulaglutide (TRULICITY) 0.75 MG/0.5ML SOAJ; Inject 0.75 mg into the skin once a week.  Dispense: 2 mL; Refill: 0  5. Need for influenza vaccination  - Flu vaccine trivalent PF, 6mos and older(Flulaval,Afluria,Fluarix,Fluzone)  6. Type 2 diabetes mellitus without complication, without long-term current use of insulin (HCC)  - metFORMIN (GLUCOPHAGE) 500 MG tablet; Take 1 tablet (500 mg total) by mouth daily with breakfast.  Dispense: 60 tablet; Refill: 2    7. Tinea pedis of both feet  - clotrimazole (LOTRIMIN) 1 % cream; Apply 1 Application topically 2 (two) times daily.  Dispense: 30 g; Refill: 1     Apply to affected and surrounding area(s)  twice daily until 1 week after clinical resolution, typically for 4 weeks total     It is important that you exercise regularly at least 30 minutes 5 times a week as tolerated  Think about what you will eat, plan ahead. Choose " clean, green, fresh or frozen" over canned, processed or packaged foods which are more sugary, salty and fatty. 70 to 75% of food eaten should be vegetables and fruit. Three meals at set times with snacks allowed between meals, but they must be fruit or vegetables. Aim to eat over a 12 hour period , example 7 am to 7 pm, and STOP after  your last meal of the day. Drink water,generally about 64 ounces per day, no other drink is as healthy. Fruit juice is best enjoyed in a healthy way, by EATING the fruit.  Thanks for choosing [facility] we consider it a privelige to serve you.

## 2023-02-18 NOTE — Assessment & Plan Note (Signed)
Diabetes is well-controlled Currently on metformin 500 mg daily She would like to restart Trulicity, Trulicity 0.75 mg once weekly injection ordered Patient counseled on low-carb modified diets Encouraged engage in regular moderate to vigorous exercise at least 150 minutes weekly Diabetic foot exam completed Up-to-date with diabetic eye exam we will get records Checking urine microalbumin

## 2023-02-18 NOTE — Assessment & Plan Note (Signed)
BP Readings from Last 3 Encounters:  02/18/23 (!) 151/78  12/04/22 (!) 140/82  08/27/22 138/80  Blood pressure goal is less than 130/80 encouraged to monitor blood pressure at home and keep a log Continue hydrochlorothiazide 25 mg daily, start losartan 25 mg daily HTN Controlled . Continue current medications. No changes in management. Discussed DASH diet and dietary sodium restrictions Continue to increase dietary efforts and exercise.  CMP today BMP in 2 weeks Follow-up in the office in 4 weeks

## 2023-02-18 NOTE — Assessment & Plan Note (Signed)
Wt Readings from Last 3 Encounters:  02/18/23 247 lb (112 kg)  12/04/22 243 lb 14.4 oz (110.6 kg)  08/27/22 245 lb 3.2 oz (111.2 kg)   Body mass index is 45.18 kg/m.  Patient counseled on low-carb diet Encouraged engage in regular moderate to vigorous exercise at least 150 minutes weekly Benefits of healthy weights discussed

## 2023-02-18 NOTE — Assessment & Plan Note (Signed)
Lab Results  Component Value Date   CHOL 209 (H) 02/11/2022   HDL 57 02/11/2022   LDLCALC 135 (H) 02/11/2022   TRIG 97 02/11/2022   CHOLHDL 3.7 02/11/2022  Currently not on a statin LDL goal is less than 70 Checking lipid panel today

## 2023-02-18 NOTE — Assessment & Plan Note (Signed)
-   clotrimazole (LOTRIMIN) 1 % cream; Apply 1 Application topically 2 (two) times daily.  Dispense: 30 g; Refill: 1

## 2023-02-19 ENCOUNTER — Other Ambulatory Visit: Payer: Self-pay

## 2023-02-19 LAB — MICROALBUMIN / CREATININE URINE RATIO
Creatinine, Urine: 236.9 mg/dL
Microalb/Creat Ratio: 5 mg/g{creat} (ref 0–29)
Microalbumin, Urine: 12.1 ug/mL

## 2023-02-19 LAB — CMP14+EGFR
ALT: 9 [IU]/L (ref 0–32)
AST: 11 [IU]/L (ref 0–40)
Albumin: 4.2 g/dL (ref 3.9–4.9)
Alkaline Phosphatase: 85 [IU]/L (ref 44–121)
BUN/Creatinine Ratio: 10 (ref 9–23)
BUN: 10 mg/dL (ref 6–24)
Bilirubin Total: 0.3 mg/dL (ref 0.0–1.2)
CO2: 23 mmol/L (ref 20–29)
Calcium: 9.4 mg/dL (ref 8.7–10.2)
Chloride: 105 mmol/L (ref 96–106)
Creatinine, Ser: 0.96 mg/dL (ref 0.57–1.00)
Globulin, Total: 2.4 g/dL (ref 1.5–4.5)
Glucose: 110 mg/dL — ABNORMAL HIGH (ref 70–99)
Potassium: 4.2 mmol/L (ref 3.5–5.2)
Sodium: 142 mmol/L (ref 134–144)
Total Protein: 6.6 g/dL (ref 6.0–8.5)
eGFR: 73 mL/min/{1.73_m2} (ref >59)

## 2023-02-19 LAB — LIPID PANEL
Chol/HDL Ratio: 3.4 {ratio} (ref 0.0–4.4)
Cholesterol, Total: 212 mg/dL — ABNORMAL HIGH (ref 100–199)
HDL: 63 mg/dL (ref >39)
LDL Chol Calc (NIH): 132 mg/dL — ABNORMAL HIGH (ref 0–99)
Triglycerides: 96 mg/dL (ref 0–149)
VLDL Cholesterol Cal: 17 mg/dL (ref 5–40)

## 2023-02-20 ENCOUNTER — Other Ambulatory Visit (HOSPITAL_COMMUNITY): Payer: Self-pay | Admitting: Nurse Practitioner

## 2023-02-20 ENCOUNTER — Other Ambulatory Visit: Payer: Self-pay

## 2023-02-20 DIAGNOSIS — E785 Hyperlipidemia, unspecified: Secondary | ICD-10-CM

## 2023-02-20 MED ORDER — ATORVASTATIN CALCIUM 20 MG PO TABS
20.0000 mg | ORAL_TABLET | Freq: Every day | ORAL | 0 refills | Status: DC
  Filled 2023-02-20: qty 30, 30d supply, fill #0
  Filled 2023-03-28: qty 30, 30d supply, fill #1

## 2023-02-21 DIAGNOSIS — M25562 Pain in left knee: Secondary | ICD-10-CM | POA: Diagnosis not present

## 2023-02-23 ENCOUNTER — Other Ambulatory Visit: Payer: Self-pay | Admitting: Nurse Practitioner

## 2023-02-23 ENCOUNTER — Telehealth: Payer: Self-pay

## 2023-02-23 DIAGNOSIS — K625 Hemorrhage of anus and rectum: Secondary | ICD-10-CM

## 2023-02-27 NOTE — Telephone Encounter (Signed)
c 

## 2023-03-03 ENCOUNTER — Other Ambulatory Visit: Payer: Self-pay

## 2023-03-05 ENCOUNTER — Other Ambulatory Visit: Payer: 59

## 2023-03-05 DIAGNOSIS — I10 Essential (primary) hypertension: Secondary | ICD-10-CM

## 2023-03-06 ENCOUNTER — Other Ambulatory Visit: Payer: Self-pay

## 2023-03-06 LAB — BASIC METABOLIC PANEL
BUN/Creatinine Ratio: 11 (ref 9–23)
BUN: 10 mg/dL (ref 6–24)
CO2: 24 mmol/L (ref 20–29)
Calcium: 9.2 mg/dL (ref 8.7–10.2)
Chloride: 108 mmol/L — ABNORMAL HIGH (ref 96–106)
Creatinine, Ser: 0.9 mg/dL (ref 0.57–1.00)
Glucose: 126 mg/dL — ABNORMAL HIGH (ref 70–99)
Potassium: 3.7 mmol/L (ref 3.5–5.2)
Sodium: 146 mmol/L — ABNORMAL HIGH (ref 134–144)
eGFR: 78 mL/min/{1.73_m2} (ref >59)

## 2023-03-09 ENCOUNTER — Other Ambulatory Visit: Payer: Self-pay | Admitting: Nurse Practitioner

## 2023-03-09 ENCOUNTER — Other Ambulatory Visit: Payer: Self-pay

## 2023-03-09 DIAGNOSIS — E119 Type 2 diabetes mellitus without complications: Secondary | ICD-10-CM

## 2023-03-09 DIAGNOSIS — M25562 Pain in left knee: Secondary | ICD-10-CM | POA: Diagnosis not present

## 2023-03-09 MED ORDER — METFORMIN HCL 500 MG PO TABS
500.0000 mg | ORAL_TABLET | Freq: Two times a day (BID) | ORAL | 2 refills | Status: DC
  Filled 2023-03-09: qty 60, 30d supply, fill #0
  Filled 2023-05-02: qty 60, 30d supply, fill #1
  Filled 2023-07-08: qty 60, 30d supply, fill #2

## 2023-03-19 ENCOUNTER — Ambulatory Visit: Payer: 59 | Admitting: Allergy

## 2023-03-31 ENCOUNTER — Other Ambulatory Visit: Payer: Self-pay

## 2023-04-03 ENCOUNTER — Ambulatory Visit: Payer: Self-pay | Admitting: Nurse Practitioner

## 2023-04-16 ENCOUNTER — Ambulatory Visit: Payer: Self-pay | Admitting: Nurse Practitioner

## 2023-04-16 NOTE — Telephone Encounter (Addendum)
  Chief Complaint: Medication Problem Symptoms: Foot Pain, Back Pain Frequency: Acute Pertinent Negatives: Patient denies chest pain, shortness of breath, or dyspnea.  Disposition: [] ED /[] Urgent Care (no appt availability in office) / [] Appointment(In office/virtual)/ []  [facility]/ [] Home Care/ [] Refused Recommended Disposition /[] [facility]/ [x]  Follow-up with PCP Additional Notes: The patient is requesting an alternative medication to her Losartan, for she believes it is causing her feet and back to cramp. The cramping is disrupting her sleep and started once she was put on Losartan.   Please Contact patient at [phone]  Reason for Disposition  [1] Caller has URGENT medicine question about med that PCP or specialist prescribed AND [2] triager unable to answer question  Answer Assessment - Initial Assessment Questions 1. NAME of MEDICINE: "What medicine(s) are you calling about?"     Losartan  2. QUESTION: "What is your question?" (e.g., double dose of medicine, side effect)     Foot Pain and Back Pain  3. PRESCRIBER: "Who prescribed the medicine?" Reason: if prescribed by specialist, call should be referred to that group.     [facility], NP  4. SYMPTOMS: "Do you have any symptoms?" If Yes, ask: "What symptoms are you having?"  "How bad are the symptoms (e.g., mild, moderate, severe)     Back Pain and Foot Pain  5. PREGNANCY:  "Is there any chance that you are pregnant?" "When was your last menstrual period?"     No and No  Protocols used: Medication Question Call-A-AH

## 2023-04-21 ENCOUNTER — Other Ambulatory Visit: Payer: Self-pay | Admitting: Nurse Practitioner

## 2023-04-21 DIAGNOSIS — I10 Essential (primary) hypertension: Secondary | ICD-10-CM

## 2023-04-21 MED ORDER — AMLODIPINE BESYLATE 2.5 MG PO TABS
2.5000 mg | ORAL_TABLET | Freq: Every day | ORAL | 0 refills | Status: DC
Start: 2023-04-21 — End: 2023-07-03
  Filled 2023-04-21: qty 30, 30d supply, fill #0
  Filled 2023-05-26: qty 30, 30d supply, fill #1

## 2023-04-22 ENCOUNTER — Other Ambulatory Visit: Payer: Self-pay

## 2023-05-02 ENCOUNTER — Other Ambulatory Visit: Payer: Self-pay | Admitting: Nurse Practitioner

## 2023-05-02 DIAGNOSIS — E785 Hyperlipidemia, unspecified: Secondary | ICD-10-CM

## 2023-05-04 ENCOUNTER — Other Ambulatory Visit: Payer: Self-pay

## 2023-05-04 MED ORDER — ATORVASTATIN CALCIUM 20 MG PO TABS
20.0000 mg | ORAL_TABLET | Freq: Every day | ORAL | 0 refills | Status: DC
  Filled 2023-05-04: qty 30, 30d supply, fill #0
  Filled 2023-06-04: qty 30, 30d supply, fill #1

## 2023-05-05 ENCOUNTER — Other Ambulatory Visit: Payer: Self-pay

## 2023-06-01 ENCOUNTER — Other Ambulatory Visit: Payer: Self-pay

## 2023-06-10 ENCOUNTER — Other Ambulatory Visit: Payer: Self-pay

## 2023-07-03 ENCOUNTER — Other Ambulatory Visit: Payer: Self-pay

## 2023-07-03 ENCOUNTER — Other Ambulatory Visit: Payer: Self-pay | Admitting: Nurse Practitioner

## 2023-07-03 ENCOUNTER — Telehealth: Payer: Self-pay

## 2023-07-03 DIAGNOSIS — I10 Essential (primary) hypertension: Secondary | ICD-10-CM

## 2023-07-03 MED ORDER — AMLODIPINE BESYLATE 2.5 MG PO TABS
2.5000 mg | ORAL_TABLET | Freq: Every day | ORAL | 0 refills | Status: DC
  Filled 2023-08-06: qty 30, 30d supply, fill #0
  Filled 2023-09-07: qty 30, 30d supply, fill #1

## 2023-07-03 MED ORDER — AMLODIPINE BESYLATE 2.5 MG PO TABS
2.5000 mg | ORAL_TABLET | Freq: Every day | ORAL | 0 refills | Status: DC
  Filled 2023-07-03: qty 30, 30d supply, fill #0

## 2023-07-03 NOTE — Telephone Encounter (Signed)
 Copied from CRM [id number]. Topic: Clinical - Prescription Issue >> Jul 03, 2023  3:51 PM Zipporah Him wrote: Reason for CRM: amLODipine  (NORVASC ) 2.5 MG tablet, patient is wanting to be able to pick this up today. Pharmacy told patient they sent over a refill request but have not heard back. Please call patient if any issues, she said it might need a prior authorization she can't remember what the pharmacy said.  Sent in . [facility]

## 2023-07-07 ENCOUNTER — Other Ambulatory Visit: Payer: Self-pay

## 2023-07-08 ENCOUNTER — Other Ambulatory Visit: Payer: Self-pay | Admitting: Family Medicine

## 2023-07-08 ENCOUNTER — Other Ambulatory Visit: Payer: Self-pay | Admitting: Nurse Practitioner

## 2023-07-08 ENCOUNTER — Other Ambulatory Visit: Payer: Self-pay

## 2023-07-08 DIAGNOSIS — E119 Type 2 diabetes mellitus without complications: Secondary | ICD-10-CM

## 2023-07-08 DIAGNOSIS — E785 Hyperlipidemia, unspecified: Secondary | ICD-10-CM

## 2023-07-08 MED ORDER — HYDROCHLOROTHIAZIDE 25 MG PO TABS
25.0000 mg | ORAL_TABLET | Freq: Every day | ORAL | 1 refills | Status: DC
  Filled 2023-07-08: qty 30, 30d supply, fill #0
  Filled 2023-08-18: qty 30, 30d supply, fill #1
  Filled 2023-09-16: qty 30, 30d supply, fill #2
  Filled 2023-10-30: qty 30, 30d supply, fill #3
  Filled 2023-12-10 (×2): qty 30, 30d supply, fill #4
  Filled 2024-02-08 (×2): qty 30, 30d supply, fill #5

## 2023-07-08 MED ORDER — ATORVASTATIN CALCIUM 20 MG PO TABS
20.0000 mg | ORAL_TABLET | Freq: Every day | ORAL | 0 refills | Status: DC
  Filled 2023-07-08: qty 30, 30d supply, fill #0

## 2023-07-14 ENCOUNTER — Other Ambulatory Visit: Payer: Self-pay

## 2023-07-15 ENCOUNTER — Other Ambulatory Visit: Payer: Self-pay

## 2023-07-20 DIAGNOSIS — M25562 Pain in left knee: Secondary | ICD-10-CM | POA: Diagnosis not present

## 2023-08-05 ENCOUNTER — Ambulatory Visit (INDEPENDENT_AMBULATORY_CARE_PROVIDER_SITE_OTHER): Admitting: Nurse Practitioner

## 2023-08-05 ENCOUNTER — Encounter: Payer: Self-pay | Admitting: Nurse Practitioner

## 2023-08-05 ENCOUNTER — Ambulatory Visit: Payer: Self-pay

## 2023-08-05 ENCOUNTER — Other Ambulatory Visit: Payer: Self-pay | Admitting: Nurse Practitioner

## 2023-08-05 VITALS — BP 126/74 | HR 78 | Wt 251.0 lb

## 2023-08-05 DIAGNOSIS — E119 Type 2 diabetes mellitus without complications: Secondary | ICD-10-CM

## 2023-08-05 DIAGNOSIS — R350 Frequency of micturition: Secondary | ICD-10-CM | POA: Insufficient documentation

## 2023-08-05 DIAGNOSIS — M25562 Pain in left knee: Secondary | ICD-10-CM | POA: Diagnosis not present

## 2023-08-05 DIAGNOSIS — Z23 Encounter for immunization: Secondary | ICD-10-CM | POA: Insufficient documentation

## 2023-08-05 DIAGNOSIS — G8929 Other chronic pain: Secondary | ICD-10-CM | POA: Insufficient documentation

## 2023-08-05 DIAGNOSIS — E785 Hyperlipidemia, unspecified: Secondary | ICD-10-CM | POA: Diagnosis not present

## 2023-08-05 DIAGNOSIS — E66813 Obesity, class 3: Secondary | ICD-10-CM

## 2023-08-05 DIAGNOSIS — E1165 Type 2 diabetes mellitus with hyperglycemia: Secondary | ICD-10-CM

## 2023-08-05 DIAGNOSIS — I10 Essential (primary) hypertension: Secondary | ICD-10-CM | POA: Diagnosis not present

## 2023-08-05 LAB — POCT URINE DIPSTICK
Bilirubin, UA: NEGATIVE
Blood, UA: NEGATIVE
Glucose, UA: NEGATIVE mg/dL
Ketones, POC UA: NEGATIVE mg/dL
Leukocytes, UA: NEGATIVE
Nitrite, UA: NEGATIVE
POC PROTEIN,UA: NEGATIVE
Spec Grav, UA: 1.02 (ref 1.010–1.025)
Urobilinogen, UA: 0.2 U/dL
pH, UA: 6 (ref 5.0–8.0)

## 2023-08-05 LAB — POCT GLYCOSYLATED HEMOGLOBIN (HGB A1C): Hemoglobin A1C: 7.2 % — AB (ref 4.0–5.6)

## 2023-08-05 MED ORDER — METFORMIN HCL 500 MG PO TABS
500.0000 mg | ORAL_TABLET | Freq: Two times a day (BID) | ORAL | 1 refills | Status: AC
  Filled 2023-08-05: qty 60, 30d supply, fill #0
  Filled 2023-10-01: qty 60, 30d supply, fill #1
  Filled 2023-11-08: qty 60, 30d supply, fill #2
  Filled 2023-12-21: qty 60, 30d supply, fill #3
  Filled 2024-02-08 (×2): qty 60, 30d supply, fill #4
  Filled 2024-03-10: qty 60, 30d supply, fill #5

## 2023-08-05 MED ORDER — SEMAGLUTIDE(0.25 OR 0.5MG/DOS) 2 MG/3ML ~~LOC~~ SOPN
0.2500 mg | PEN_INJECTOR | SUBCUTANEOUS | 3 refills | Status: DC
Start: 2023-08-05 — End: 2023-08-06
  Filled 2023-08-05 – 2023-08-06 (×2): qty 3, 56d supply, fill #0

## 2023-08-05 NOTE — Assessment & Plan Note (Signed)
 Patient complaining of urinary frequency, dysuria UA negative for UTI Lab Results  Component Value Date   COLORU yellow 08/05/2023   CLARITYU clear 08/05/2023   GLUCOSEUR negative 08/05/2023   BILIRUBINUR negative 08/05/2023   KETONESU neg 08/16/2019   SPECGRAV 1.020 08/05/2023   RBCUR negative 08/05/2023   PHUR 6.0 08/05/2023   PROTEINUR NEGATIVE 11/26/2021   UROBILINOGEN 0.2 08/05/2023   LEUKOCYTESUR Negative 08/05/2023

## 2023-08-05 NOTE — Patient Instructions (Signed)
 Please start taking metformin  500 mg twice daily Goal for fasting blood sugar ranges from 80 to 120 and 2 hours after any meal or at bedtime should be between 130 to 170.    It is important that you exercise regularly at least 30 minutes 5 times a week as tolerated  Think about what you will eat, plan ahead. Choose " clean, green, fresh or frozen" over canned, processed or packaged foods which are more sugary, salty and fatty. 70 to 75% of food eaten should be vegetables and fruit. Three meals at set times with snacks allowed between meals, but they must be fruit or vegetables. Aim to eat over a 12 hour period , example 7 am to 7 pm, and STOP after  your last meal of the day. Drink water,generally about 64 ounces per day, no other drink is as healthy. Fruit juice is best enjoyed in a healthy way, by EATING the fruit.  Thanks for choosing [facility] we consider it a privelige to serve you.

## 2023-08-05 NOTE — Assessment & Plan Note (Addendum)
 Chronic medical condition currently well-controlled on amlodipine  2.5 mg daily, hydrochlorothiazide  25 mg daily, continue current medications back in February  she had requested an alternative medication for  Losartan , for she believes it is causing her feet and back to cramp. The cramping is disrupting her sleep and started once she was put on Losartan .  DASH diet and commitment to daily physical activity for a minimum of 30 minutes discussed and encouraged, as a part of hypertension management. The importance of attaining a healthy weight is also discussed.     08/05/2023    2:32 PM 02/18/2023    8:57 AM 02/18/2023    8:27 AM 12/04/2022   11:40 AM 12/04/2022    8:41 AM 08/27/2022   11:04 AM 08/19/2022    9:08 AM  BP/Weight  Systolic BP 126 151 137 140 148 138 139  Diastolic BP 74 78 81 82 92 80 82  Wt. (Lbs) 251  247  243.9 245.2   BMI 45.91 kg/m2  45.18 kg/m2  44.61 kg/m2 44.85 kg/m2

## 2023-08-05 NOTE — Progress Notes (Signed)
 Established Patient Office Visit  Subjective:  Patient ID: Melinda Ingram, female    DOB: Oct 10, 1975  Age: 48 y.o. MRN: [id number]  CC:  Chief Complaint  Patient presents with   Knee Injury    Left     HPI Melinda Ingram is a 48 y.o. female  has a past medical history of Anxiety, Arthritis, Class 3 severe obesity due to excess calories with serious comorbidity in adult, unspecified BMI (02/14/2020), Diabetes mellitus without complication (HCC), Dyslipidemia, goal LDL below 70 (02/18/2023), Dyspnea, Gallstone, Headache, History of kidney stones, and Hypertension.  Patient presents for follow-up for her chronic medical conditions She denies any adverse reactions to current medications  Please see assessment and plan section for full HPI Due for Tdap vaccine and shingles vaccine both vaccines given in the office today     Past Medical History:  Diagnosis Date   Anxiety    Arthritis    Class 3 severe obesity due to excess calories with serious comorbidity in adult, unspecified BMI 02/14/2020   Diabetes mellitus without complication (HCC)    type 2   Dyslipidemia, goal LDL below 70 02/18/2023   Dyspnea    walking    Gallstone    Headache    occasional   History of kidney stones    Hypertension    Stopped Lisinopril because of side effects.    Past Surgical History:  Procedure Laterality Date   cells frozen     dysplasia   CHOLECYSTECTOMY     01/28/17 Dr. Lanell Pinta   CHOLECYSTECTOMY N/A 01/28/2017   Procedure: LAPAROSCOPIC CHOLECYSTECTOMY;  Surgeon: Adalberto Acton, MD;  Location: [facility];  Service: General;  Laterality: N/A;   TUBAL LIGATION      Family History  Problem Relation Age of Onset   Allergic rhinitis Mother    Hypertension Mother    Diabetes Mother    Heart failure Mother    Hypertension Father    Allergic rhinitis Maternal Aunt    Breast cancer Maternal Aunt    Allergic rhinitis Maternal Uncle    Allergic rhinitis Paternal Aunt    Breast  cancer Paternal Aunt    Allergic rhinitis Paternal Uncle    Breast cancer Cousin    Breast cancer Cousin     Social History   Socioeconomic History   Marital status: Divorced    Spouse name: Not on file   Number of children: 2   Years of education: Not on file   Highest education level: Associate degree: occupational, Scientist, product/process development, or vocational program  Occupational History   Not on file  Tobacco Use   Smoking status: Never    Passive exposure: Past (Mother and Father)   Smokeless tobacco: Never  Vaping Use   Vaping status: Never Used  Substance and Sexual Activity   Alcohol use: Yes    Comment: occasional   Drug use: No   Sexual activity: Yes    Birth control/protection: Surgical  Other Topics Concern   Not on file  Social History Narrative   Not on file   Social Drivers of Health   Financial Resource Strain: Not on file  Food Insecurity: No Food Insecurity (12/13/2020)   Hunger Vital Sign    Worried About Running Out of Food in the Last Year: Never true    Ran Out of Food in the Last Year: Never true  Transportation Needs: No Transportation Needs (12/13/2020)   PRAPARE - Administrator, Civil Service (Medical): No  Lack of Transportation (Non-Medical): No  Physical Activity: Not on file  Stress: Not on file  Social Connections: Not on file  Intimate Partner Violence: Not on file    Outpatient Medications Prior to Visit  Medication Sig Dispense Refill   amLODipine  (NORVASC ) 2.5 MG tablet Take 1 tablet (2.5 mg total) by mouth daily. 60 tablet 0   atorvastatin  (LIPITOR) 20 MG tablet Take 1 tablet (20 mg total) by mouth daily. 60 tablet 0   Blood Glucose Monitoring Suppl (BLOOD GLUCOSE MONITOR SYSTEM) w/Device KIT use as directed 1 kit 0   hydrochlorothiazide  (HYDRODIURIL ) 25 MG tablet Take 1 tablet (25 mg total) by mouth once daily. 90 tablet 1   latanoprost  (XALATAN ) 0.005 % ophthalmic solution Instill 1 drop Both Eyes at bedtime 2.5 mL 11    RYALTRIS  665-25 MCG/ACT SUSP INSTILL 2 SPRAYS INTO THE NOSE IN THE MORNING AND AT BEDTIME. 29 g 1   metFORMIN  (GLUCOPHAGE ) 500 MG tablet Take 1 tablet (500 mg total) by mouth daily with breakfast. 60 tablet 2   buPROPion  (WELLBUTRIN  XL) 150 MG 24 hr tablet Take 1 tablet (150 mg total) by mouth daily. (Patient not taking: Reported on 12/04/2022) 30 tablet 11   fexofenadine  (ALLEGRA ) 180 MG tablet Take 1 tablet (180 mg total) by mouth daily. (Patient not taking: Reported on 08/05/2023) 30 tablet 5   halobetasol  (ULTRAVATE ) 0.05 % ointment Apply topically to affected areas of scalp daily. (Patient not taking: Reported on 08/19/2022) 50 g 3   meloxicam  (MOBIC ) 15 MG tablet Take 1 tablet (15 mg total) by mouth daily. (Patient not taking: Reported on 08/05/2023) 90 tablet 1   Nutritional Supplements (EAA SUPPLEMENT PO) Take by mouth. (Patient not taking: Reported on 08/05/2023)     Olopatadine  HCl (PATADAY ) 0.2 % SOLN Place 1 drop into both eyes 1 day or 1 dose. (Patient not taking: Reported on 08/05/2023) 2.5 mL 5   clotrimazole  (LOTRIMIN ) 1 % cream Apply 1 Application topically 2 (two) times daily. (Patient not taking: Reported on 08/05/2023) 30 g 1   Dulaglutide  (TRULICITY ) 0.75 MG/0.5ML SOAJ Inject 0.75 mg into the skin once a week. (Patient not taking: Reported on 08/05/2023) 2 mL 0   halobetasol  (ULTRAVATE ) 0.05 % ointment Apply 1 Application to affected areas on scalp daily. (Patient not taking: Reported on 08/19/2022) 50 g 3   latanoprost  (XALATAN ) 0.005 % ophthalmic solution Place 1 drop into both eyes at bedtime. 2.5 mL 11   metFORMIN  (GLUCOPHAGE ) 500 MG tablet Take 1 tablet (500 mg total) by mouth 2 (two) times daily with a meal. (Patient not taking: Reported on 08/05/2023) 60 tablet 2   No facility-administered medications prior to visit.    Allergies  Allergen Reactions   Lisinopril Cough    ROS Review of Systems  Constitutional:  Negative for appetite change, chills, fatigue and fever.  HENT:   Negative for congestion, postnasal drip, rhinorrhea and sneezing.   Respiratory:  Negative for cough, shortness of breath and wheezing.   Cardiovascular:  Negative for chest pain, palpitations and leg swelling.  Gastrointestinal:  Negative for abdominal pain, constipation, nausea and vomiting.  Genitourinary:  Negative for difficulty urinating, dysuria, flank pain and frequency.  Musculoskeletal:  Positive for arthralgias and joint swelling. Negative for back pain and myalgias.  Skin:  Negative for color change, pallor, rash and wound.  Neurological:  Negative for dizziness, facial asymmetry, weakness, numbness and headaches.  Psychiatric/Behavioral:  Negative for behavioral problems, confusion, self-injury and suicidal ideas.  Objective:     Physical Exam Vitals and nursing note reviewed.  Constitutional:      General: She is not in acute distress.    Appearance: Normal appearance. She is obese. She is not ill-appearing, toxic-appearing or diaphoretic.  Eyes:     General: No scleral icterus.       Right eye: No discharge.        Left eye: No discharge.     Extraocular Movements: Extraocular movements intact.     Conjunctiva/sclera: Conjunctivae normal.  Cardiovascular:     Rate and Rhythm: Normal rate and regular rhythm.     Pulses: Normal pulses.     Heart sounds: Normal heart sounds. No murmur heard.    No friction rub. No gallop.  Pulmonary:     Effort: Pulmonary effort is normal. No respiratory distress.     Breath sounds: Normal breath sounds. No stridor. No wheezing, rhonchi or rales.  Chest:     Chest wall: No tenderness.  Abdominal:     General: There is no distension.     Palpations: Abdomen is soft.     Tenderness: There is no abdominal tenderness. There is no right CVA tenderness, left CVA tenderness or guarding.  Musculoskeletal:        General: Swelling present. No tenderness, deformity or signs of injury.     Right lower leg: No edema.     Left lower  leg: No edema.     Comments: Mild swelling noted on the left knee, no redness or tenderness noted on palpation, knee is warm and dry  Skin:    General: Skin is warm and dry.     Capillary Refill: Capillary refill takes less than 2 seconds.     Coloration: Skin is not jaundiced or pale.     Findings: No bruising, erythema or lesion.  Neurological:     Mental Status: She is alert and oriented to person, place, and time.     Motor: No weakness.     Coordination: Coordination normal.     Gait: Gait normal.  Psychiatric:        Mood and Affect: Mood normal.        Behavior: Behavior normal.        Thought Content: Thought content normal.        Judgment: Judgment normal.     BP 126/74   Pulse 78   Wt 251 lb (113.9 kg)   SpO2 99%   BMI 45.91 kg/m  Wt Readings from Last 3 Encounters:  08/05/23 251 lb (113.9 kg)  02/18/23 247 lb (112 kg)  12/04/22 243 lb 14.4 oz (110.6 kg)    Lab Results  Component Value Date   TSH 1.360 02/15/2019   Lab Results  Component Value Date   WBC 7.9 01/31/2020   HGB 12.2 01/31/2020   HCT 37.5 01/31/2020   MCV 78.6 (L) 01/31/2020   PLT 292 01/31/2020   Lab Results  Component Value Date   NA 146 (H) 03/05/2023   K 3.7 03/05/2023   CO2 24 03/05/2023   GLUCOSE 126 (H) 03/05/2023   BUN 10 03/05/2023   CREATININE 0.90 03/05/2023   BILITOT 0.3 02/18/2023   ALKPHOS 85 02/18/2023   AST 11 02/18/2023   ALT 9 02/18/2023   PROT 6.6 02/18/2023   ALBUMIN 4.2 02/18/2023   CALCIUM  9.2 03/05/2023   ANIONGAP 7 01/31/2020   EGFR 78 03/05/2023   Lab Results  Component Value Date   CHOL 212 (  H) 02/18/2023   Lab Results  Component Value Date   HDL 63 02/18/2023   Lab Results  Component Value Date   LDLCALC 132 (H) 02/18/2023   Lab Results  Component Value Date   TRIG 96 02/18/2023   Lab Results  Component Value Date   CHOLHDL 3.4 02/18/2023   Lab Results  Component Value Date   HGBA1C 7.2 (A) 08/05/2023      Assessment & Plan:    Problem List Items Addressed This Visit       Cardiovascular and Mediastinum   Essential hypertension   Chronic medical condition currently well-controlled on amlodipine  2.5 mg daily, hydrochlorothiazide  25 mg daily, continue current medications back in February  she had requested an alternative medication for  Losartan , for she believes it is causing her feet and back to cramp. The cramping is disrupting her sleep and started once she was put on Losartan .  DASH diet and commitment to daily physical activity for a minimum of 30 minutes discussed and encouraged, as a part of hypertension management. The importance of attaining a healthy weight is also discussed.     08/05/2023    2:32 PM 02/18/2023    8:57 AM 02/18/2023    8:27 AM 12/04/2022   11:40 AM 12/04/2022    8:41 AM 08/27/2022   11:04 AM 08/19/2022    9:08 AM  BP/Weight  Systolic BP 126 151 137 140 148 138 139  Diastolic BP 74 78 81 82 92 80 82  Wt. (Lbs) 251  247  243.9 245.2   BMI 45.91 kg/m2  45.18 kg/m2  44.61 kg/m2 44.85 kg/m2              Endocrine   Uncontrolled type 2 diabetes mellitus with hyperglycemia, without long-term current use of insulin (HCC) - Primary   Lab Results  Component Value Date   HGBA1C 7.2 (A) 08/05/2023  She has stopped taking insulin because of the high cost of the co-pay Taking metformin  500 mg daily States that her diet can be better, she has been eating a lot of sweets high carbohydrate food and does not exercise Up-to-date with diabetic eye exam records requested She will apply for patient assistance for Ozempic Increase metformin  to 500 mg twice daily pending day approval for Ozempic Patient counseled on low-carb diet Encouraged to engage in regular moderate exercise at least 150 minutes weekly as tolerated       Relevant Medications   Semaglutide,0.25 or 0.5MG /DOS, 2 MG/3ML SOPN   metFORMIN  (GLUCOPHAGE ) 500 MG tablet   Other Relevant Orders   POCT glycosylated hemoglobin (Hb  A1C) (Completed)   Direct LDL   Basic Metabolic Panel     Other   Class 3 severe obesity due to excess calories with serious comorbidity in adult, unspecified BMI   Wt Readings from Last 3 Encounters:  08/05/23 251 lb (113.9 kg)  02/18/23 247 lb (112 kg)  12/04/22 243 lb 14.4 oz (110.6 kg)   Body mass index is 45.91 kg/m.  Patient has added 5 pounds since her last visit Counseled on low-carb diet Encouraged engage in regular moderate to vigorous exercises at least 150 minutes weekly as tolerated Has upcoming appointment with a nutritionist Apply for a patient assistance for Ozempic      Relevant Medications   Semaglutide,0.25 or 0.5MG /DOS, 2 MG/3ML SOPN   metFORMIN  (GLUCOPHAGE ) 500 MG tablet   Dyslipidemia, goal LDL below 70   Lab Results  Component Value Date   CHOL 212 (H)  02/18/2023   HDL 63 02/18/2023   LDLCALC 132 (H) 02/18/2023   TRIG 96 02/18/2023   CHOLHDL 3.4 02/18/2023  On atorvastatin  20 mg daily Checking lipid panel Avoid fatty fried foods, lose weight      Need for shingles vaccine   Patient educated on CDC recommendation for the vaccine. Verbal consent was obtained from the patient, vaccine administered by nurse, no sign of adverse reactions noted at this time. Patient education on arm soreness and use of tylenol  or ibuprofen  for this patient  was discussed. Patient educated on the signs and symptoms of adverse effect and advise to contact the office if they occu      Relevant Orders   Zoster Recombinant (Shingrix ) (Completed)   Urine frequency   Patient complaining of urinary frequency, dysuria UA negative for UTI Lab Results  Component Value Date   COLORU yellow 08/05/2023   CLARITYU clear 08/05/2023   GLUCOSEUR negative 08/05/2023   BILIRUBINUR negative 08/05/2023   KETONESU neg 08/16/2019   SPECGRAV 1.020 08/05/2023   RBCUR negative 08/05/2023   PHUR 6.0 08/05/2023   PROTEINUR NEGATIVE 11/26/2021   UROBILINOGEN 0.2 08/05/2023   LEUKOCYTESUR  Negative 08/05/2023         Relevant Orders   POCT URINE DIPSTICK (Completed)   Need for pneumococcal 20-valent conjugate vaccination   Patient educated on CDC recommendation for the vaccine. Verbal consent was obtained from the patient, vaccine administered by nurse, no sign of adverse reactions noted at this time. Patient education on arm soreness and use of tylenol  or ibuprofen  for this patient  was discussed. Patient educated on the signs and symptoms of adverse effect and advise to contact the office if they occur.       Relevant Orders   Pneumococcal conjugate vaccine 20-valent (Completed)   Chronic pain of left knee   Established with orthopedics, has received 3 steroid joint injections Has some swelling under left knee ,uses ice and elevation, takes Tylenol  arthritis as needed and uses an OTC cream Use of knee brace, heating pad stretching exercises encouraged Continue Tylenol  arthritis as needed       Meds ordered this encounter  Medications   Semaglutide,0.25 or 0.5MG /DOS, 2 MG/3ML SOPN    Sig: Inject 0.25 mg into the skin once a week.    Dispense:  3 mL    Refill:  0   metFORMIN  (GLUCOPHAGE ) 500 MG tablet    Sig: Take 1 tablet (500 mg total) by mouth 2 (two) times daily with a meal.    Dispense:  180 tablet    Refill:  1    Follow-up: Return in about 3 months (around 11/05/2023) for CPE.    Cayleb Jarnigan R Herschel Fleagle, FNP

## 2023-08-05 NOTE — Assessment & Plan Note (Signed)
 Patient educated on CDC recommendation for the vaccine. Verbal consent was obtained from the patient, vaccine administered by nurse, no sign of adverse reactions noted at this time. Patient education on arm soreness and use of tylenol or ibuprofen for this patient  was discussed. Patient educated on the signs and symptoms of adverse effect and advise to contact the office if they occur.

## 2023-08-05 NOTE — Assessment & Plan Note (Signed)
 Patient educated on CDC recommendation for the vaccine. Verbal consent was obtained from the patient, vaccine administered by nurse, no sign of adverse reactions noted at this time. Patient education on arm soreness and use of tylenol  or ibuprofen  for this patient  was discussed. Patient educated on the signs and symptoms of adverse effect and advise to contact the office if they occu

## 2023-08-05 NOTE — Assessment & Plan Note (Addendum)
 Lab Results  Component Value Date   HGBA1C 7.2 (A) 08/05/2023  She has stopped taking insulin because of the high cost of the co-pay Taking metformin  500 mg daily States that her diet can be better, she has been eating a lot of sweets high carbohydrate food and does not exercise Up-to-date with diabetic eye exam records requested She will apply for patient assistance for Ozempic Increase metformin  to 500 mg twice daily pending day approval for Ozempic Patient counseled on low-carb diet Encouraged to engage in regular moderate exercise at least 150 minutes weekly as tolerated

## 2023-08-05 NOTE — Assessment & Plan Note (Signed)
 Lab Results  Component Value Date   CHOL 212 (H) 02/18/2023   HDL 63 02/18/2023   LDLCALC 132 (H) 02/18/2023   TRIG 96 02/18/2023   CHOLHDL 3.4 02/18/2023  On atorvastatin  20 mg daily Checking lipid panel Avoid fatty fried foods, lose weight

## 2023-08-05 NOTE — Assessment & Plan Note (Signed)
 Wt Readings from Last 3 Encounters:  08/05/23 251 lb (113.9 kg)  02/18/23 247 lb (112 kg)  12/04/22 243 lb 14.4 oz (110.6 kg)   Body mass index is 45.91 kg/m.  Patient has added 5 pounds since her last visit Counseled on low-carb diet Encouraged engage in regular moderate to vigorous exercises at least 150 minutes weekly as tolerated Has upcoming appointment with a nutritionist Apply for a patient assistance for Ozempic

## 2023-08-05 NOTE — Assessment & Plan Note (Addendum)
 Established with orthopedics, has received 3 steroid joint injections Has some swelling under left knee ,uses ice and elevation, takes Tylenol  arthritis as needed and uses an OTC cream Use of knee brace, heating pad stretching exercises encouraged Continue Tylenol  arthritis as needed

## 2023-08-05 NOTE — Telephone Encounter (Signed)
 FYI Only or Action Required?: FYI only for provider  Patient was last seen in primary care on 02/18/2023 by Paseda, Folashade R, FNP. Called Nurse Triage reporting Joint Swelling. Symptoms began several months ago. Interventions attempted: OTC medications: motrin, Rest, hydration, or home remedies, and Ice/heat application. Symptoms are: gradually worsening.  Triage Disposition: No disposition on file. See PCP when office is open (Within 3 days)  Patient/caregiver understands and will follow disposition?: Yes  Copied from CRM [id number]. Topic: Clinical - Red Word Triage >> Aug 05, 2023 11:36 AM Everlene Hobby D wrote: Patient says she has arthritis and edema, swelling in knees.Pain in legs. Mostly comes at night she says it depends on how much walking she's doing. Reason for Disposition  MILD or MODERATE swelling (e.g., can't move joint normally, can't do usual activities) (Exceptions: Itchy, localized swelling; swelling is chronic.)  Answer Assessment - Initial Assessment Questions 1. LOCATION: "Where is the swelling located?"  (e.g., left, right, both knees)     Both 2. ONSET: "When did the swelling start?" "Does it come and go, or is it there all the time?"     Several months, constant 3. SWELLING: "How bad is the swelling?" Or, "How large is it?" (e.g., mild, moderate, severe; size of localized swelling)    - NONE: No joint swelling.   - LOCALIZED: Localized; small area of puffy or swollen skin (e.g., insect bite, skin irritation).   - MILD: Joint looks or feels mildly swollen or puffy.   - MODERATE: Swollen; interferes with normal activities (e.g., work or school); can't move joint normally (bend and straighten completely); may be limping.   - SEVERE: Very swollen; can't move swollen joint at all; limping a lot or unable to walk.     Moderate 4. PAIN: "Is there any pain?" If Yes, ask: "How bad is it?" (Scale 1-10; or mild, moderate, severe)   - NONE (0): no pain.   - MILD (1-3): doesn't  interfere with normal activities.    - MODERATE (4-7): interferes with normal activities (e.g., work or school) or awakens from sleep, limping.    - SEVERE (8-10): excruciating pain, unable to do any normal activities, unable to walk.      moderate 5. SETTING: "Has there been any recent work, exercise or other activity that involved that part of the body?"      NA 6. AGGRAVATING FACTORS: "What makes the knee swelling worse?" (e.g., walking, climbing stairs, running)     Ambulation 7. ASSOCIATED SYMPTOMS: "Is there any pain or redness?"     pain 8. OTHER SYMPTOMS: "Do you have any other symptoms?" (e.g., chest pain, difficulty breathing, fever, calf pain)     Denies other symptoms.   Additional information:  Cortisone injection last week with minimal effect. BP 08/04/23 169/100 before meds  Protocols used: Knee Swelling-A-AH

## 2023-08-05 NOTE — Telephone Encounter (Unsigned)
 Copied from CRM [id number]. Topic: Clinical - Medication Refill >> Aug 05, 2023 11:31 AM Everlene Hobby D wrote: Medication: Blood Glucose Monitoring Suppl (BLOOD GLUCOSE MONITOR SYSTEM) w/Device KIT  Has the patient contacted their pharmacy? No (Agent: If no, request that the patient contact the pharmacy for the refill. If patient does not wish to contact the pharmacy document the reason why and proceed with request.) (Agent: If yes, when and what did the pharmacy advise?)  This is the patient's preferred pharmacy:  [facility] [address], [address] [place] [address] Phone: [phone] Fax: [phone]   Is this the correct pharmacy for this prescription? Yes If no, delete pharmacy and type the correct one.   Has the prescription been filled recently? No  Is the patient out of the medication? Yes  Has the patient been seen for an appointment in the last year OR does the patient have an upcoming appointment? Yes  Can we respond through MyChart? No  Agent: Please be advised that Rx refills may take up to 3 business days. We ask that you follow-up with your pharmacy.

## 2023-08-06 ENCOUNTER — Other Ambulatory Visit: Payer: Self-pay

## 2023-08-06 ENCOUNTER — Telehealth: Payer: Self-pay

## 2023-08-06 ENCOUNTER — Other Ambulatory Visit: Payer: Self-pay | Admitting: Nurse Practitioner

## 2023-08-06 DIAGNOSIS — E1165 Type 2 diabetes mellitus with hyperglycemia: Secondary | ICD-10-CM

## 2023-08-06 LAB — BASIC METABOLIC PANEL WITH GFR
BUN/Creatinine Ratio: 14 (ref 9–23)
BUN: 11 mg/dL (ref 6–24)
CO2: 22 mmol/L (ref 20–29)
Calcium: 9.8 mg/dL (ref 8.7–10.2)
Chloride: 102 mmol/L (ref 96–106)
Creatinine, Ser: 0.81 mg/dL (ref 0.57–1.00)
Glucose: 81 mg/dL (ref 70–99)
Potassium: 4.2 mmol/L (ref 3.5–5.2)
Sodium: 143 mmol/L (ref 134–144)
eGFR: 88 mL/min/{1.73_m2} (ref >59)

## 2023-08-06 LAB — LDL CHOLESTEROL, DIRECT: LDL Direct: 89 mg/dL (ref 0–99)

## 2023-08-06 MED ORDER — BLOOD GLUCOSE MONITOR SYSTEM W/DEVICE KIT
PACK | 0 refills | Status: DC
  Filled 2023-08-06: qty 1, 30d supply, fill #0

## 2023-08-06 NOTE — Telephone Encounter (Signed)
 Copied from CRM [id number]. Topic: General - Call Back - No Documentation >> Aug 06, 2023  1:34 PM Allyne Areola wrote: Reason for CRM: Patient is returning a call she received from [facility] asking to call back; however, no documentation on file.

## 2023-08-06 NOTE — Telephone Encounter (Unsigned)
 Copied from CRM [id number]. Topic: Clinical - Medication Refill >> Aug 06, 2023  4:48 PM Felizardo Hotter wrote: Medication: Semaglutide,0.25 or 0.5MG /DOS, (OZEMPIC, 0.25 OR 0.5 MG/DOSE,) 2 MG/3ML SOPN, glucose stripes and lancets.   Has the patient contacted their pharmacy? Yes (Agent: If no, request that the patient contact the pharmacy for the refill. If patient does not wish to contact the pharmacy document the reason why and proceed with request.) (Agent: If yes, when and what did the pharmacy advise?)  This is the patient's preferred pharmacy:  [facility] [address] [place] [address] Phone: [phone] Fax: [phone]  Is this the correct pharmacy for this prescription? Yes If no, delete pharmacy and type the correct one.   Has the prescription been filled recently? Yes  Is the patient out of the medication? Yes  Has the patient been seen for an appointment in the last year OR does the patient have an upcoming appointment? Yes  Can we respond through MyChart? Yes  Agent: Please be advised that Rx refills may take up to 3 business days. We ask that you follow-up with your pharmacy.

## 2023-08-07 ENCOUNTER — Telehealth: Payer: Self-pay

## 2023-08-07 ENCOUNTER — Other Ambulatory Visit: Payer: Self-pay | Admitting: Nurse Practitioner

## 2023-08-07 ENCOUNTER — Ambulatory Visit: Payer: Self-pay | Admitting: Nurse Practitioner

## 2023-08-07 ENCOUNTER — Other Ambulatory Visit (HOSPITAL_COMMUNITY): Payer: Self-pay

## 2023-08-07 ENCOUNTER — Other Ambulatory Visit: Payer: Self-pay

## 2023-08-07 DIAGNOSIS — E785 Hyperlipidemia, unspecified: Secondary | ICD-10-CM

## 2023-08-07 MED ORDER — OZEMPIC (0.25 OR 0.5 MG/DOSE) 2 MG/3ML ~~LOC~~ SOPN
0.2500 mg | PEN_INJECTOR | SUBCUTANEOUS | 3 refills | Status: DC
  Filled 2023-08-07: qty 3, 56d supply, fill #0

## 2023-08-07 MED ORDER — ATORVASTATIN CALCIUM 40 MG PO TABS
40.0000 mg | ORAL_TABLET | Freq: Every day | ORAL | 1 refills | Status: DC
  Filled 2023-08-07: qty 30, 30d supply, fill #0
  Filled 2023-09-07: qty 30, 30d supply, fill #1
  Filled 2023-10-08: qty 30, 30d supply, fill #2
  Filled 2023-11-22: qty 30, 30d supply, fill #3

## 2023-08-07 NOTE — Telephone Encounter (Signed)
 Pharmacy Patient Advocate Encounter   Received notification from Patient Pharmacy that prior authorization for OZEMPIC 0.25/0.5MG  is required/requested.   Insurance verification completed.   The patient is insured through [employer] .   Per test claim: PA required; PA submitted to above mentioned insurance via CoverMyMeds Key/confirmation #/EOC [id number]. Status is pending

## 2023-08-10 ENCOUNTER — Telehealth: Payer: Self-pay

## 2023-08-10 ENCOUNTER — Other Ambulatory Visit: Payer: Self-pay

## 2023-08-10 ENCOUNTER — Other Ambulatory Visit (HOSPITAL_COMMUNITY): Payer: Self-pay

## 2023-08-10 ENCOUNTER — Other Ambulatory Visit: Payer: Self-pay | Admitting: Nurse Practitioner

## 2023-08-10 MED ORDER — TRULICITY 0.75 MG/0.5ML ~~LOC~~ SOAJ
0.7500 mg | SUBCUTANEOUS | 0 refills | Status: DC
  Filled 2023-08-10 (×2): qty 2, 28d supply, fill #0

## 2023-08-10 NOTE — Telephone Encounter (Signed)
 Copied from CRM [id number]. Topic: Clinical - Prescription Issue >> Aug 10, 2023  4:43 PM Bridgette Campus T wrote: Reason for CRM: Semaglutide ,0.25 or 0.5MG /DOS, (OZEMPIC , 0.25 OR 0.5 MG/DOSE,)- not covered, want to change back to Dulaglutide  (TRULICITY ).  Please advise [facility]

## 2023-08-10 NOTE — Telephone Encounter (Signed)
 Pharmacy Patient Advocate Encounter  Received notification from [facility] [employer]/ [employer] that Prior Authorization for OZEMPIC  has been DENIED.  Full denial letter will be uploaded to the media tab. See denial reason below.  Your plan only covers this product when you meet one of these options:  A) You have tried other products your plan covers (preferred products), and they did not work well for you, or  B) Your doctor gives us  a medical reason you cannot take those other products. For your plan, you may need to try up to three preferred products. We have denied your request because you do not meet any of these conditions. We reviewed the information we had. Your request has been denied. Your doctor can send us  any new or missing information for us  to review.   The preferred products for your plan are: Trulicity , liraglutide, Rybelsus . Your doctor may need to get approval from your plan for preferred products. For this product, you may have tomeetother criteria.

## 2023-08-11 ENCOUNTER — Other Ambulatory Visit: Payer: Self-pay

## 2023-08-13 ENCOUNTER — Other Ambulatory Visit: Payer: Self-pay

## 2023-08-17 ENCOUNTER — Other Ambulatory Visit: Payer: Self-pay

## 2023-08-19 ENCOUNTER — Telehealth: Payer: Self-pay

## 2023-08-19 ENCOUNTER — Other Ambulatory Visit: Payer: Self-pay

## 2023-08-19 ENCOUNTER — Other Ambulatory Visit: Payer: Self-pay | Admitting: Nurse Practitioner

## 2023-08-19 DIAGNOSIS — E119 Type 2 diabetes mellitus without complications: Secondary | ICD-10-CM

## 2023-08-19 MED ORDER — BLOOD GLUCOSE MONITOR SYSTEM W/DEVICE KIT
PACK | 0 refills | Status: DC
  Filled 2023-08-19: qty 1, fill #0

## 2023-08-19 NOTE — Telephone Encounter (Signed)
 Copied from CRM [id number]. Topic: Clinical - Medication Question >> Aug 19, 2023  1:57 PM Emylou G wrote: Reason for CRM: Patient called.Aaron Aas looking for the ( strips and lancets ) for the Blood Glucose Monitoring Suppl (BLOOD GLUCOSE MONITOR SYSTEM) w/Device KIT

## 2023-08-20 ENCOUNTER — Other Ambulatory Visit: Payer: Self-pay

## 2023-08-24 ENCOUNTER — Other Ambulatory Visit: Payer: Self-pay | Admitting: Nurse Practitioner

## 2023-08-24 ENCOUNTER — Ambulatory Visit: Payer: Self-pay

## 2023-08-24 ENCOUNTER — Other Ambulatory Visit: Payer: Self-pay

## 2023-08-24 DIAGNOSIS — E119 Type 2 diabetes mellitus without complications: Secondary | ICD-10-CM

## 2023-08-24 NOTE — Telephone Encounter (Signed)
 Attempt X1 to call patient regarding s/s noted in last call- blurred vision.   No response noted.  Unable to leave VM- mailbox not set up.

## 2023-08-24 NOTE — Telephone Encounter (Signed)
 Attempt X2 to call patient regarding s/s noted in last call- blurred vision.     No response noted.   Unable to leave VM- mailbox not set up

## 2023-08-24 NOTE — Telephone Encounter (Signed)
 Copied from CRM [id number]. Topic: Clinical - Prescription Issue >> Aug 24, 2023 11:26 AM Avram MATSU wrote: Reason for CRM: Blood Glucose Monitoring Suppl (BLOOD GLUCOSE MONITOR SYSTEM) w/Device KIT [[id number]] patient is missing her test scripts and she's having blurred vision   [facility] - [facility] [address], [address] [place] [address] Phone: [phone] Fax: [phone]

## 2023-08-24 NOTE — Telephone Encounter (Signed)
 Attempt X3 to call patient regarding s/s noted in last call- blurred vision.     No response noted.   Unable to leave VM- mailbox not set up  Message routed, per protocol.

## 2023-08-25 ENCOUNTER — Other Ambulatory Visit: Payer: Self-pay

## 2023-08-26 ENCOUNTER — Other Ambulatory Visit: Payer: Self-pay

## 2023-08-26 ENCOUNTER — Telehealth: Payer: Self-pay

## 2023-08-26 MED ORDER — BLOOD GLUCOSE MONITOR SYSTEM W/DEVICE KIT
1.0000 | PACK | Freq: Two times a day (BID) | 0 refills | Status: AC
  Filled 2023-08-26: qty 1, 30d supply, fill #0

## 2023-08-26 MED ORDER — LANCET DEVICE MISC
1.0000 | Freq: Two times a day (BID) | 0 refills | Status: AC
  Filled 2023-08-26: qty 1, 30d supply, fill #0

## 2023-08-26 MED ORDER — ACCU-CHEK SOFTCLIX LANCETS MISC
1.0000 | Freq: Two times a day (BID) | 5 refills | Status: AC
  Filled 2023-08-26: qty 200, fill #0
  Filled 2023-08-26: qty 100, 30d supply, fill #0

## 2023-08-26 MED ORDER — BLOOD GLUCOSE TEST VI STRP
1.0000 | ORAL_STRIP | Freq: Two times a day (BID) | 5 refills | Status: AC
  Filled 2023-08-26: qty 50, 25d supply, fill #0

## 2023-08-26 NOTE — Telephone Encounter (Signed)
 Copied from CRM [id number]. Topic: Clinical - Prescription Issue >> Aug 25, 2023  4:31 PM Avram MATSU wrote: Reason for CRM: there was 3 attempts made to call the patient back regarding her test strips. Patient is still waiting for the test strips, he was not able to test her blood for 1 week. Brianna called from [facility] to check the status

## 2023-09-01 DIAGNOSIS — E119 Type 2 diabetes mellitus without complications: Secondary | ICD-10-CM | POA: Diagnosis not present

## 2023-09-01 DIAGNOSIS — H40053 Ocular hypertension, bilateral: Secondary | ICD-10-CM | POA: Diagnosis not present

## 2023-09-01 DIAGNOSIS — H02822 Cysts of right lower eyelid: Secondary | ICD-10-CM | POA: Diagnosis not present

## 2023-09-01 DIAGNOSIS — H1045 Other chronic allergic conjunctivitis: Secondary | ICD-10-CM | POA: Diagnosis not present

## 2023-09-01 DIAGNOSIS — H04123 Dry eye syndrome of bilateral lacrimal glands: Secondary | ICD-10-CM | POA: Diagnosis not present

## 2023-09-01 DIAGNOSIS — H02825 Cysts of left lower eyelid: Secondary | ICD-10-CM | POA: Diagnosis not present

## 2023-09-01 LAB — HM DIABETES EYE EXAM

## 2023-09-06 ENCOUNTER — Other Ambulatory Visit: Payer: Self-pay | Admitting: Nurse Practitioner

## 2023-09-07 ENCOUNTER — Other Ambulatory Visit: Payer: Self-pay

## 2023-09-07 MED ORDER — LATANOPROST 0.005 % OP SOLN
1.0000 [drp] | Freq: Every evening | OPHTHALMIC | 5 refills | Status: AC
  Filled 2023-09-07: qty 2.5, 25d supply, fill #0
  Filled 2023-10-30: qty 2.5, 25d supply, fill #1
  Filled 2023-12-10: qty 2.5, 25d supply, fill #2
  Filled 2024-01-11: qty 2.5, 25d supply, fill #3
  Filled 2024-03-02: qty 2.5, 25d supply, fill #4

## 2023-09-07 MED ORDER — TRULICITY 0.75 MG/0.5ML ~~LOC~~ SOAJ
0.7500 mg | SUBCUTANEOUS | 0 refills | Status: DC
  Filled 2023-09-07: qty 2, 28d supply, fill #0

## 2023-09-08 ENCOUNTER — Other Ambulatory Visit: Payer: Self-pay

## 2023-09-09 ENCOUNTER — Other Ambulatory Visit: Payer: Self-pay

## 2023-09-16 ENCOUNTER — Other Ambulatory Visit: Payer: Self-pay

## 2023-09-17 ENCOUNTER — Other Ambulatory Visit: Payer: Self-pay

## 2023-09-23 ENCOUNTER — Other Ambulatory Visit: Payer: Self-pay

## 2023-09-28 ENCOUNTER — Other Ambulatory Visit: Payer: Self-pay | Admitting: Allergy

## 2023-10-01 ENCOUNTER — Other Ambulatory Visit: Payer: Self-pay

## 2023-10-01 ENCOUNTER — Other Ambulatory Visit: Payer: Self-pay | Admitting: Nurse Practitioner

## 2023-10-01 DIAGNOSIS — I10 Essential (primary) hypertension: Secondary | ICD-10-CM

## 2023-10-01 MED ORDER — AMLODIPINE BESYLATE 2.5 MG PO TABS
2.5000 mg | ORAL_TABLET | Freq: Every day | ORAL | 0 refills | Status: DC
  Filled 2023-10-01: qty 30, 30d supply, fill #0
  Filled 2023-11-08: qty 30, 30d supply, fill #1

## 2023-10-06 ENCOUNTER — Other Ambulatory Visit: Payer: Self-pay | Admitting: Nurse Practitioner

## 2023-10-07 ENCOUNTER — Other Ambulatory Visit: Payer: Self-pay

## 2023-10-07 MED ORDER — TRULICITY 0.75 MG/0.5ML ~~LOC~~ SOAJ
0.7500 mg | SUBCUTANEOUS | 0 refills | Status: DC
  Filled 2023-10-07: qty 2, 28d supply, fill #0

## 2023-10-08 ENCOUNTER — Other Ambulatory Visit: Payer: Self-pay

## 2023-10-14 ENCOUNTER — Other Ambulatory Visit: Payer: Self-pay

## 2023-10-16 ENCOUNTER — Other Ambulatory Visit: Payer: Self-pay

## 2023-10-30 ENCOUNTER — Other Ambulatory Visit: Payer: Self-pay

## 2023-11-06 ENCOUNTER — Encounter: Payer: Self-pay | Admitting: Nurse Practitioner

## 2023-11-23 ENCOUNTER — Other Ambulatory Visit: Payer: Self-pay | Admitting: Nurse Practitioner

## 2023-11-23 ENCOUNTER — Ambulatory Visit
Admission: RE | Admit: 2023-11-23 | Discharge: 2023-11-23 | Disposition: A | Discharge status: Home / Self Care | Source: Ambulatory Visit | Attending: Nurse Practitioner | Admitting: Nurse Practitioner

## 2023-11-23 ENCOUNTER — Other Ambulatory Visit: Payer: Self-pay

## 2023-11-23 DIAGNOSIS — Z1231 Encounter for screening mammogram for malignant neoplasm of breast: Secondary | ICD-10-CM | POA: Diagnosis not present

## 2023-11-25 ENCOUNTER — Ambulatory Visit: Payer: Self-pay | Admitting: Nurse Practitioner

## 2023-12-10 ENCOUNTER — Other Ambulatory Visit: Payer: Self-pay | Admitting: Nurse Practitioner

## 2023-12-10 ENCOUNTER — Other Ambulatory Visit: Payer: Self-pay

## 2023-12-10 DIAGNOSIS — I10 Essential (primary) hypertension: Secondary | ICD-10-CM

## 2023-12-11 ENCOUNTER — Other Ambulatory Visit: Payer: Self-pay

## 2023-12-11 MED ORDER — AMLODIPINE BESYLATE 2.5 MG PO TABS
2.5000 mg | ORAL_TABLET | Freq: Every day | ORAL | 0 refills | Status: DC
  Filled 2023-12-11: qty 30, 30d supply, fill #0
  Filled 2024-01-11: qty 30, 30d supply, fill #1

## 2023-12-11 NOTE — Telephone Encounter (Signed)
 Please call pt to schedule a follow up. KH

## 2023-12-15 ENCOUNTER — Encounter: Payer: Self-pay | Admitting: Nurse Practitioner

## 2023-12-15 ENCOUNTER — Ambulatory Visit: Payer: Self-pay | Admitting: Nurse Practitioner

## 2023-12-15 ENCOUNTER — Other Ambulatory Visit: Payer: Self-pay

## 2023-12-15 VITALS — BP 126/65 | HR 76 | Ht 62.0 in | Wt 252.0 lb

## 2023-12-15 DIAGNOSIS — E66813 Obesity, class 3: Secondary | ICD-10-CM

## 2023-12-15 DIAGNOSIS — H40059 Ocular hypertension, unspecified eye: Secondary | ICD-10-CM | POA: Diagnosis not present

## 2023-12-15 DIAGNOSIS — Z23 Encounter for immunization: Secondary | ICD-10-CM

## 2023-12-15 DIAGNOSIS — M545 Low back pain, unspecified: Secondary | ICD-10-CM | POA: Diagnosis not present

## 2023-12-15 DIAGNOSIS — E119 Type 2 diabetes mellitus without complications: Secondary | ICD-10-CM | POA: Diagnosis not present

## 2023-12-15 DIAGNOSIS — G8929 Other chronic pain: Secondary | ICD-10-CM | POA: Insufficient documentation

## 2023-12-15 DIAGNOSIS — I10 Essential (primary) hypertension: Secondary | ICD-10-CM | POA: Diagnosis not present

## 2023-12-15 DIAGNOSIS — M25562 Pain in left knee: Secondary | ICD-10-CM | POA: Diagnosis not present

## 2023-12-15 DIAGNOSIS — E785 Hyperlipidemia, unspecified: Secondary | ICD-10-CM | POA: Diagnosis not present

## 2023-12-15 DIAGNOSIS — E1165 Type 2 diabetes mellitus with hyperglycemia: Secondary | ICD-10-CM | POA: Diagnosis not present

## 2023-12-15 LAB — POCT GLYCOSYLATED HEMOGLOBIN (HGB A1C): Hemoglobin A1C: 7.1 % — AB (ref 4.0–5.6)

## 2023-12-15 MED ORDER — TRULICITY 1.5 MG/0.5ML ~~LOC~~ SOAJ
1.5000 mg | SUBCUTANEOUS | 0 refills | Status: DC
  Filled 2023-12-15: qty 2, 28d supply, fill #0

## 2023-12-15 MED ORDER — TRULICITY 0.75 MG/0.5ML ~~LOC~~ SOAJ
0.7500 mg | SUBCUTANEOUS | 0 refills | Status: DC
  Filled 2023-12-15: qty 2, 28d supply, fill #0

## 2023-12-15 NOTE — Assessment & Plan Note (Signed)
 Ocular hypertension Managed with Latanoprost . Vision blurry in the morning, advised follow-up with eye doctor. - Continue Latanoprost  0.005% ophthalmic drops at bedtime. - Maintain close follow-up with eye doctor.

## 2023-12-15 NOTE — Assessment & Plan Note (Signed)
 Wt Readings from Last 3 Encounters:  12/15/23 252 lb (114.3 kg)  08/05/23 251 lb (113.9 kg)  02/18/23 247 lb (112 kg)   Body mass index is 46.09 kg/m.  Class 3 severe obesity due to excess calories Obesity management discussed in context of diabetes and overall health. Emphasized exercise and diet for weight loss and diabetes control  - Encourage moderate to vigorous exercise for 30 minutes, five days a week. - Advise low-carb diet, avoiding sugar, sweets, soda, and flour-based foods. - Refer to weight management specialist.

## 2023-12-15 NOTE — Assessment & Plan Note (Signed)
 Chronic knee pain Chronic pain exacerbated by obesity. Managed with ibuprofen and Tylenol  Arthritis. Advised against excessive ibuprofen use due to kidney risks. - Recommend Tylenol  Arthritis as needed for pain. - Advise against excessive use of ibuprofen due to kidney risks. - Suggest stretches, exercise, and weight loss for pain relief.

## 2023-12-15 NOTE — Progress Notes (Signed)
 Established Patient Office Visit  Subjective:  Patient ID: Melinda Ingram, female    DOB: 1976-01-05  Age: 48 y.o. MRN: [id number]  CC:  Chief Complaint  Patient presents with   Medical Management of Chronic Issues    HPI   Discussed the use of AI scribe software for clinical note transcription with the patient, who gave verbal consent to proceed.  History of Present Illness Melinda Ingram is a 48 year old female  has a past medical history of Anxiety, Arthritis, Class 3 severe obesity due to excess calories with serious comorbidity in adult, unspecified BMI (HCC) (02/14/2020), Diabetes mellitus without complication (HCC), Dyslipidemia, goal LDL below 70 (02/18/2023), Dyspnea, Gallstone, Headache, History of kidney stones, and Hypertension.  who presents for follow-up on blood pressure and diabetes management.  She has been experiencing elevated blood pressure, which she attributes to stress related to her mother's bipolar disorder. She is currently on amlodipine  2.5 mg daily and hydrochlorothiazide  25 mg daily. She has a history of allergic reactions to lisinopril and losartan .  Her diabetes management is challenging, with recent A1c levels at 7.1%, higher than her previous levels of 6.6-6.5%. She has been inconsistent with Trulicity  due to no reason  and finds the low dose ineffective. She is also on metformin  500 mg twice daily. She experienced blurry vision this morning and has not been regularly checking her blood sugar, only when she feels 'funny'.  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 She uses ibuprofen and Tylenol  Arthritis as needed for pain management, being cautious of ibuprofen's potential kidney impact.  She is on latanoprost  eye drops for ocular hypertension and atorvastatin  40 mg daily for high cholesterol. Her last LDL level was 89 mg/dL. She works a lot and engages in some  walking for physical activity. Stress from her mother's bipolar disorder has impacted her life.    Assessment & Plan      Past Medical History:  Diagnosis Date   Anxiety    Arthritis    Class 3 severe obesity due to excess calories with serious comorbidity in adult, unspecified BMI (HCC) 02/14/2020   Diabetes mellitus without complication (HCC)    type 2   Dyslipidemia, goal LDL below 70 02/18/2023   Dyspnea    walking    Gallstone    Headache    occasional   History of kidney stones    Hypertension    Stopped Lisinopril because of side effects.    Past Surgical History:  Procedure Laterality Date   cells frozen     dysplasia   CHOLECYSTECTOMY     01/28/17 Dr. Signe   CHOLECYSTECTOMY N/A 01/28/2017   Procedure: LAPAROSCOPIC CHOLECYSTECTOMY;  Surgeon: Signe Mitzie LABOR, MD;  Location: [facility];  Service: General;  Laterality: N/A;   TUBAL LIGATION      Family History  Problem Relation Age of Onset   Allergic rhinitis Mother    Hypertension Mother    Diabetes Mother    Heart failure Mother    Hypertension Father    Allergic rhinitis Maternal Aunt    Breast cancer Maternal Aunt    Allergic rhinitis Maternal Uncle    Allergic rhinitis Paternal Aunt    Breast cancer Paternal Aunt    Allergic rhinitis Paternal Uncle    Breast cancer Cousin    Breast cancer Cousin     Social History   Socioeconomic History  Marital status: Divorced    Spouse name: Not on file   Number of children: 2   Years of education: Not on file   Highest education level: Associate degree: occupational, Scientist, product/process development, or vocational program  Occupational History   Not on file  Tobacco Use   Smoking status: Never    Passive exposure: Past (Mother and Father)   Smokeless tobacco: Never  Vaping Use   Vaping status: Never Used  Substance and Sexual Activity   Alcohol use: Yes    Comment: occasional   Drug use: No   Sexual activity: Yes    Birth control/protection: Surgical  Other Topics  Concern   Not on file  Social History Narrative   Not on file   Social Drivers of Health   Financial Resource Strain: Not on file  Food Insecurity: No Food Insecurity (12/13/2020)   Hunger Vital Sign    Worried About Running Out of Food in the Last Year: Never true    Ran Out of Food in the Last Year: Never true  Transportation Needs: No Transportation Needs (12/13/2020)   PRAPARE - Administrator, Civil Service (Medical): No    Lack of Transportation (Non-Medical): No  Physical Activity: Not on file  Stress: Not on file  Social Connections: Not on file  Intimate Partner Violence: Not on file    Outpatient Medications Prior to Visit  Medication Sig Dispense Refill   Accu-Chek Softclix Lancets lancets Use 2 (two) times daily. 200 each 5   amLODipine  (NORVASC ) 2.5 MG tablet Take 1 tablet (2.5 mg total) by mouth daily. 60 tablet 0   atorvastatin  (LIPITOR) 40 MG tablet Take 1 tablet (40 mg total) by mouth daily. 90 tablet 1   Blood Glucose Monitoring Suppl (BLOOD GLUCOSE MONITOR SYSTEM) w/Device KIT 1 each by Does not apply route 2 (two) times daily. 1 kit 0   Glucose Blood (BLOOD GLUCOSE TEST STRIPS) STRP Use 2 (two) times daily. 200 strip 5   hydrochlorothiazide  (HYDRODIURIL ) 25 MG tablet Take 1 tablet (25 mg total) by mouth once daily. 90 tablet 1   latanoprost  (XALATAN ) 0.005 % ophthalmic solution Instill 1 drop Both Eyes at bedtime 2.5 mL 11   latanoprost  (XALATAN ) 0.005 % ophthalmic solution Place 1 drop into both eyes at bedtime. 2.5 mL 5   metFORMIN  (GLUCOPHAGE ) 500 MG tablet Take 1 tablet (500 mg total) by mouth 2 (two) times daily with a meal. 180 tablet 1   Olopatadine -Mometasone (RYALTRIS ) 665-25 MCG/ACT SUSP INSTILL 2 SPRAYS INTO EACH NOSTRIL IN THE MORNING AND AT BEDTIME. 29 g 1   Dulaglutide  (TRULICITY ) 0.75 MG/0.5ML SOAJ Inject 0.75 mg into the skin once a week. 2 mL 0   buPROPion  (WELLBUTRIN  XL) 150 MG 24 hr tablet Take 1 tablet (150 mg total) by mouth  daily. (Patient not taking: Reported on 12/04/2022) 30 tablet 11   fexofenadine  (ALLEGRA ) 180 MG tablet Take 1 tablet (180 mg total) by mouth daily. (Patient not taking: Reported on 08/05/2023) 30 tablet 5   halobetasol  (ULTRAVATE ) 0.05 % ointment Apply topically to affected areas of scalp daily. (Patient not taking: Reported on 08/19/2022) 50 g 3   meloxicam  (MOBIC ) 15 MG tablet Take 1 tablet (15 mg total) by mouth daily. (Patient not taking: Reported on 08/05/2023) 90 tablet 1   Nutritional Supplements (EAA SUPPLEMENT PO) Take by mouth. (Patient not taking: Reported on 08/05/2023)     Olopatadine  HCl (PATADAY ) 0.2 % SOLN Place 1 drop into both eyes 1 day  or 1 dose. (Patient not taking: Reported on 08/05/2023) 2.5 mL 5   No facility-administered medications prior to visit.    Allergies  Allergen Reactions   Lisinopril Cough    ROS Review of Systems  Constitutional:  Negative for appetite change, chills, fatigue and fever.  HENT:  Negative for congestion, postnasal drip, rhinorrhea and sneezing.   Respiratory:  Negative for cough, shortness of breath and wheezing.   Cardiovascular:  Negative for chest pain, palpitations and leg swelling.  Gastrointestinal:  Negative for abdominal pain, constipation, nausea and vomiting.  Genitourinary:  Negative for difficulty urinating, dysuria, flank pain and frequency.  Musculoskeletal:  Positive for arthralgias and back pain. Negative for joint swelling and myalgias.  Skin:  Negative for color change, pallor, rash and wound.  Neurological:  Negative for dizziness, facial asymmetry, weakness, numbness and headaches.  Psychiatric/Behavioral:  Negative for behavioral problems, confusion, self-injury and suicidal ideas.       Objective:    Physical Exam Vitals and nursing note reviewed.  Constitutional:      General: She is not in acute distress.    Appearance: Normal appearance. She is obese. She is not ill-appearing, toxic-appearing or diaphoretic.   Eyes:     General: No scleral icterus.       Right eye: No discharge.        Left eye: No discharge.     Extraocular Movements: Extraocular movements intact.     Conjunctiva/sclera: Conjunctivae normal.  Cardiovascular:     Rate and Rhythm: Normal rate and regular rhythm.     Pulses: Normal pulses.     Heart sounds: Normal heart sounds. No murmur heard.    No friction rub. No gallop.  Pulmonary:     Effort: Pulmonary effort is normal. No respiratory distress.     Breath sounds: Normal breath sounds. No stridor. No wheezing, rhonchi or rales.  Chest:     Chest wall: No tenderness.  Abdominal:     General: There is no distension.     Palpations: Abdomen is soft.     Tenderness: There is no abdominal tenderness. There is no right CVA tenderness, left CVA tenderness or guarding.  Musculoskeletal:        General: No swelling, tenderness, deformity or signs of injury.     Right lower leg: No edema.     Left lower leg: No edema.  Skin:    General: Skin is warm and dry.     Capillary Refill: Capillary refill takes 2 to 3 seconds.     Coloration: Skin is not jaundiced or pale.     Findings: No bruising, erythema or lesion.  Neurological:     Mental Status: She is alert and oriented to person, place, and time.     Motor: No weakness.     Gait: Gait normal.  Psychiatric:        Mood and Affect: Mood normal.        Behavior: Behavior normal.        Thought Content: Thought content normal.        Judgment: Judgment normal.     BP 126/65   Pulse 76   Ht 5' 2 (1.575 m)   Wt 252 lb (114.3 kg)   LMP 11/30/2023   BMI 46.09 kg/m  Wt Readings from Last 3 Encounters:  12/15/23 252 lb (114.3 kg)  08/05/23 251 lb (113.9 kg)  02/18/23 247 lb (112 kg)    Lab Results  Component Value Date  TSH 1.360 02/15/2019   Lab Results  Component Value Date   WBC 7.9 01/31/2020   HGB 12.2 01/31/2020   HCT 37.5 01/31/2020   MCV 78.6 (L) 01/31/2020   PLT 292 01/31/2020   Lab Results   Component Value Date   NA 143 08/05/2023   K 4.2 08/05/2023   CO2 22 08/05/2023   GLUCOSE 81 08/05/2023   BUN 11 08/05/2023   CREATININE 0.81 08/05/2023   BILITOT 0.3 02/18/2023   ALKPHOS 85 02/18/2023   AST 11 02/18/2023   ALT 9 02/18/2023   PROT 6.6 02/18/2023   ALBUMIN 4.2 02/18/2023   CALCIUM  9.8 08/05/2023   ANIONGAP 7 01/31/2020   EGFR 88 08/05/2023   Lab Results  Component Value Date   CHOL 212 (H) 02/18/2023   Lab Results  Component Value Date   HDL 63 02/18/2023   Lab Results  Component Value Date   LDLCALC 132 (H) 02/18/2023   Lab Results  Component Value Date   TRIG 96 02/18/2023   Lab Results  Component Value Date   CHOLHDL 3.4 02/18/2023   Lab Results  Component Value Date   HGBA1C 7.1 (A) 12/15/2023      Assessment & Plan:   Problem List Items Addressed This Visit       Cardiovascular and Mediastinum   Essential hypertension   BP Readings from Last 3 Encounters:  12/15/23 126/65  08/05/23 126/74  02/18/23 (!) 151/78   Blood pressure controlled at 126/65 with Amlodipine  and Hydrochlorothiazide . Previous issues with Lisinopril and Losartan . - Continue Amlodipine  2.5 mg daily. - Continue Hydrochlorothiazide  25 mg daily. Discussed DASH diet and dietary sodium restrictions Continue to increase dietary efforts and exercise.            Endocrine   Uncontrolled type 2 diabetes mellitus with hyperglycemia, without long-term current use of insulin (HCC) - Primary   Lab Results  Component Value Date   HGBA1C 7.1 (A) 12/15/2023    A1c at 7.1 indicates uncontrolled diabetes. Trulicity  coverage resumed. Discussed exercise and low-carb diet importance. Explained risks of uncontrolled diabetes and Trulicity  benefits. - Restart Trulicity  at 0.75 mg for four weeks, then increase to 1.5 mg as tolerated. - Continue Metformin  500 mg twice daily. - Encourage moderate to vigorous exercise for 30 minutes, five days a week. - Advise low-carb diet,  avoiding sugar, sweets, soda, and flour-based foods. - Check blood sugar occasionally, especially if feeling unwell. - Refer to weight management specialist.       Relevant Medications   Dulaglutide  (TRULICITY ) 0.75 MG/0.5ML SOAJ   Dulaglutide  (TRULICITY ) 1.5 MG/0.5ML SOAJ (Start on 01/12/2024)   Other Relevant Orders   POCT glycosylated hemoglobin (Hb A1C) (Completed)   Direct LDL   Basic Metabolic Panel   CBC     Other   Class 3 severe obesity due to excess calories with serious comorbidity in adult, unspecified BMI (HCC)   Wt Readings from Last 3 Encounters:  12/15/23 252 lb (114.3 kg)  08/05/23 251 lb (113.9 kg)  02/18/23 247 lb (112 kg)   Body mass index is 46.09 kg/m.  Class 3 severe obesity due to excess calories Obesity management discussed in context of diabetes and overall health. Emphasized exercise and diet for weight loss and diabetes control  - Encourage moderate to vigorous exercise for 30 minutes, five days a week. - Advise low-carb diet, avoiding sugar, sweets, soda, and flour-based foods. - Refer to weight management specialist.       Relevant Medications  Dulaglutide  (TRULICITY ) 0.75 MG/0.5ML SOAJ   Dulaglutide  (TRULICITY ) 1.5 MG/0.5ML SOAJ (Start on 01/12/2024)   Other Relevant Orders   Amb Ref to Medical Weight Management   Dyslipidemia, goal LDL below 70   Lab Results  Component Value Date   CHOL 212 (H) 02/18/2023   HDL 63 02/18/2023   LDLCALC 132 (H) 02/18/2023   LDLDIRECT 89 08/05/2023   TRIG 96 02/18/2023   CHOLHDL 3.4 02/18/2023    LDL was 89, goal is below 70 to reduce stroke and heart attack risk. On Atorvastatin  40 mg daily. - Order direct LDL test. - Continue Atorvastatin  40 mg daily.       Need for influenza vaccination   Relevant Orders   Flu vaccine trivalent PF, 6mos and older(Flulaval,Afluria,Fluarix,Fluzone) (Completed)   Need for shingles vaccine   Relevant Orders   Varicella-zoster vaccine IM (Completed)   Chronic pain  of left knee   Chronic knee pain Chronic pain exacerbated by obesity. Managed with ibuprofen and Tylenol  Arthritis. Advised against excessive ibuprofen use due to kidney risks. - Recommend Tylenol  Arthritis as needed for pain. - Advise against excessive use of ibuprofen due to kidney risks. - Suggest stretches, exercise, and weight loss for pain relief.       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 Advised against excessive ibuprofen use due to kidney risks. - Recommend Tylenol  Arthritis as needed for pain. - Advise against excessive use of ibuprofen due to kidney risks. - Suggest stretches, exercise, and use of a heating pad for pain relief.       Ocular hypertension   Ocular hypertension Managed with Latanoprost . Vision blurry in the morning, advised follow-up with eye doctor. - Continue Latanoprost  0.005% ophthalmic drops at bedtime. - Maintain close follow-up with eye doctor.        Meds ordered this encounter  Medications   Dulaglutide  (TRULICITY ) 0.75 MG/0.5ML SOAJ    Sig: Inject 0.75 mg into the skin once a week.    Dispense:  2 mL    Refill:  0   Dulaglutide  (TRULICITY ) 1.5 MG/0.5ML SOAJ    Sig: Inject 1.5 mg into the skin once a week.    Dispense:  2 mL    Refill:  0    Follow-up: Return in about 3 months (around 03/16/2024) for CPE.    Rhian Asebedo R Perla Echavarria, FNP

## 2023-12-15 NOTE — Assessment & Plan Note (Signed)
 Lab Results  Component Value Date   HGBA1C 7.1 (A) 12/15/2023    A1c at 7.1 indicates uncontrolled diabetes. Trulicity  coverage resumed. Discussed exercise and low-carb diet importance. Explained risks of uncontrolled diabetes and Trulicity  benefits. - Restart Trulicity  at 0.75 mg for four weeks, then increase to 1.5 mg as tolerated. - Continue Metformin  500 mg twice daily. - Encourage moderate to vigorous exercise for 30 minutes, five days a week. - Advise low-carb diet, avoiding sugar, sweets, soda, and flour-based foods. - Check blood sugar occasionally, especially if feeling unwell. - Refer to weight management specialist.

## 2023-12-15 NOTE — Assessment & Plan Note (Signed)
 BP Readings from Last 3 Encounters:  12/15/23 126/65  08/05/23 126/74  02/18/23 (!) 151/78   Blood pressure controlled at 126/65 with Amlodipine  and Hydrochlorothiazide . Previous issues with Lisinopril and Losartan . - Continue Amlodipine  2.5 mg daily. - Continue Hydrochlorothiazide  25 mg daily. Discussed DASH diet and dietary sodium restrictions Continue to increase dietary efforts and exercise.

## 2023-12-15 NOTE — Patient Instructions (Addendum)
 Goal for fasting blood sugar ranges from 80 to 120 and 2 hours after any meal or at bedtime should be between 130 to 170.   1. Type 2 diabetes mellitus without complication, without long-term current use of insulin (HCC) (Primary)  - POCT glycosylated hemoglobin (Hb A1C) - Dulaglutide  (TRULICITY ) 0.75 MG/0.5ML SOAJ; Inject 0.75 mg into the skin once a week.   Use for 4 weeks then  - Dulaglutide  (TRULICITY ) 1.5 MG/0.5ML SOAJ; Inject 1.5 mg into the skin once a week.   Continue metformin  500 mg twice daily   Medical weight management clinic [phone]    It is important that you exercise regularly at least 30 minutes 5 times a week as tolerated  Think about what you will eat, plan ahead. Choose  clean, green, fresh or frozen over canned, processed or packaged foods which are more sugary, salty and fatty. 70 to 75% of food eaten should be vegetables and fruit. Three meals at set times with snacks allowed between meals, but they must be fruit or vegetables. Aim to eat over a 12 hour period , example 7 am to 7 pm, and STOP after  your last meal of the day. Drink water,generally about 64 ounces per day, no other drink is as healthy. Fruit juice is best enjoyed in a healthy way, by EATING the fruit.  Thanks for choosing [facility] we consider it a privelige to serve you.

## 2023-12-15 NOTE — Assessment & Plan Note (Signed)
 Lab Results  Component Value Date   CHOL 212 (H) 02/18/2023   HDL 63 02/18/2023   LDLCALC 132 (H) 02/18/2023   LDLDIRECT 89 08/05/2023   TRIG 96 02/18/2023   CHOLHDL 3.4 02/18/2023    LDL was 89, goal is below 70 to reduce stroke and heart attack risk. On Atorvastatin  40 mg daily. - Order direct LDL test. - Continue Atorvastatin  40 mg daily.

## 2023-12-15 NOTE — Assessment & Plan Note (Signed)
 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 Advised against excessive ibuprofen use due to kidney risks. - Recommend Tylenol  Arthritis as needed for pain. - Advise against excessive use of ibuprofen due to kidney risks. - Suggest stretches, exercise, and use of a heating pad for pain relief.

## 2023-12-16 ENCOUNTER — Ambulatory Visit: Payer: Self-pay | Admitting: Nurse Practitioner

## 2023-12-16 DIAGNOSIS — E785 Hyperlipidemia, unspecified: Secondary | ICD-10-CM

## 2023-12-16 DIAGNOSIS — D649 Anemia, unspecified: Secondary | ICD-10-CM

## 2023-12-16 DIAGNOSIS — D509 Iron deficiency anemia, unspecified: Secondary | ICD-10-CM

## 2023-12-16 LAB — CBC
Hematocrit: 35.2 % (ref 34.0–46.6)
Hemoglobin: 10.6 g/dL — ABNORMAL LOW (ref 11.1–15.9)
MCH: 22.5 pg — ABNORMAL LOW (ref 26.6–33.0)
MCHC: 30.1 g/dL — ABNORMAL LOW (ref 31.5–35.7)
MCV: 75 fL — ABNORMAL LOW (ref 79–97)
Platelets: 319 x10E3/uL (ref 150–450)
RBC: 4.71 x10E6/uL (ref 3.77–5.28)
RDW: 17.7 % — ABNORMAL HIGH (ref 11.7–15.4)
WBC: 7.8 x10E3/uL (ref 3.4–10.8)

## 2023-12-16 LAB — BASIC METABOLIC PANEL WITH GFR
BUN/Creatinine Ratio: 15 (ref 9–23)
BUN: 13 mg/dL (ref 6–24)
CO2: 21 mmol/L (ref 20–29)
Calcium: 9.6 mg/dL (ref 8.7–10.2)
Chloride: 107 mmol/L — ABNORMAL HIGH (ref 96–106)
Creatinine, Ser: 0.89 mg/dL (ref 0.57–1.00)
Glucose: 93 mg/dL (ref 70–99)
Potassium: 3.8 mmol/L (ref 3.5–5.2)
Sodium: 145 mmol/L — ABNORMAL HIGH (ref 134–144)
eGFR: 79 mL/min/1.73 (ref >59)

## 2023-12-16 LAB — LDL CHOLESTEROL, DIRECT: LDL Direct: 77 mg/dL (ref 0–99)

## 2023-12-17 LAB — IRON,TIBC AND FERRITIN PANEL
Ferritin: 10 ng/mL — ABNORMAL LOW (ref 15–150)
Iron Saturation: 10 % — ABNORMAL LOW (ref 15–55)
Iron: 34 ug/dL (ref 27–159)
Total Iron Binding Capacity: 346 ug/dL (ref 250–450)
UIBC: 312 ug/dL (ref 131–425)

## 2023-12-17 LAB — SPECIMEN STATUS REPORT

## 2023-12-18 ENCOUNTER — Other Ambulatory Visit: Payer: Self-pay

## 2023-12-18 MED ORDER — FERROUS SULFATE 325 (65 FE) MG PO TBEC
325.0000 mg | DELAYED_RELEASE_TABLET | Freq: Every day | ORAL | 1 refills | Status: AC
  Filled 2023-12-18: qty 90, 90d supply, fill #0
  Filled 2024-03-24: qty 90, 90d supply, fill #1

## 2023-12-18 MED ORDER — ATORVASTATIN CALCIUM 80 MG PO TABS
80.0000 mg | ORAL_TABLET | Freq: Every day | ORAL | 3 refills | Status: AC
  Filled 2023-12-18: qty 30, 30d supply, fill #0
  Filled 2024-01-11: qty 30, 30d supply, fill #1
  Filled 2024-03-02: qty 30, 30d supply, fill #2
  Filled 2024-03-24: qty 30, 30d supply, fill #3

## 2023-12-24 ENCOUNTER — Other Ambulatory Visit: Payer: Self-pay

## 2024-01-14 ENCOUNTER — Other Ambulatory Visit: Payer: Self-pay

## 2024-02-02 ENCOUNTER — Other Ambulatory Visit: Payer: Self-pay

## 2024-02-02 DIAGNOSIS — H40053 Ocular hypertension, bilateral: Secondary | ICD-10-CM | POA: Diagnosis not present

## 2024-02-02 MED ORDER — LATANOPROST 0.005 % OP SOLN
1.0000 [drp] | Freq: Every morning | OPHTHALMIC | 3 refills | Status: AC
  Filled 2024-02-02: qty 2.5, 25d supply, fill #0

## 2024-02-03 ENCOUNTER — Telehealth: Payer: Self-pay | Admitting: Nurse Practitioner

## 2024-02-03 NOTE — Telephone Encounter (Signed)
 Copied from CRM [id number]. Topic: Referral - Question >> Feb 03, 2024  2:32 PM Emylou G wrote: Reason for CRM: Pam w/[facility] called.. Thinks they rcvd a referral in error.. Was this meant for medical weight management?  The diagnosis is [phone]. Class 3 ..  Is this an error from us .. Pls call her back and or send to the correct department.  Her number is [phone].

## 2024-02-04 NOTE — Telephone Encounter (Signed)
 Can you correct this Referral to go to [facility] in [place] please?

## 2024-02-04 NOTE — Telephone Encounter (Signed)
 Done

## 2024-02-05 ENCOUNTER — Other Ambulatory Visit: Payer: Self-pay

## 2024-02-05 ENCOUNTER — Other Ambulatory Visit: Payer: Self-pay | Admitting: Nurse Practitioner

## 2024-02-05 DIAGNOSIS — E1165 Type 2 diabetes mellitus with hyperglycemia: Secondary | ICD-10-CM

## 2024-02-05 MED ORDER — TRULICITY 3 MG/0.5ML ~~LOC~~ SOAJ
3.0000 mg | SUBCUTANEOUS | 3 refills | Status: AC
  Filled 2024-02-05 – 2024-02-09 (×2): qty 2, 28d supply, fill #0
  Filled 2024-03-02: qty 2, 28d supply, fill #1

## 2024-02-05 NOTE — Telephone Encounter (Unsigned)
 Copied from CRM #[id number]. Topic: Clinical - Medication Question >> Feb 05, 2024 10:02 AM Laymon HERO wrote: Reason for CRM: patient currently taking Dulaglutide  (TRULICITY ) 1.5 MG/0.5ML SOAJ- wanting to know if she is able to go up in dosage- she said it is not doing anything where she is at

## 2024-02-08 ENCOUNTER — Other Ambulatory Visit: Payer: Self-pay | Admitting: Nurse Practitioner

## 2024-02-08 ENCOUNTER — Other Ambulatory Visit: Payer: Self-pay

## 2024-02-08 DIAGNOSIS — I10 Essential (primary) hypertension: Secondary | ICD-10-CM

## 2024-02-08 MED ORDER — AMLODIPINE BESYLATE 2.5 MG PO TABS
2.5000 mg | ORAL_TABLET | Freq: Every day | ORAL | 0 refills | Status: AC
  Filled 2024-02-08: qty 30, 30d supply, fill #0
  Filled 2024-03-10: qty 30, 30d supply, fill #1

## 2024-02-09 ENCOUNTER — Other Ambulatory Visit: Payer: Self-pay

## 2024-03-02 ENCOUNTER — Other Ambulatory Visit: Payer: Self-pay

## 2024-03-10 ENCOUNTER — Other Ambulatory Visit: Payer: Self-pay

## 2024-03-10 ENCOUNTER — Other Ambulatory Visit: Payer: Self-pay | Admitting: Nurse Practitioner

## 2024-03-10 DIAGNOSIS — E119 Type 2 diabetes mellitus without complications: Secondary | ICD-10-CM

## 2024-03-11 ENCOUNTER — Other Ambulatory Visit: Payer: Self-pay

## 2024-03-11 MED ORDER — HYDROCHLOROTHIAZIDE 25 MG PO TABS
25.0000 mg | ORAL_TABLET | Freq: Every day | ORAL | 1 refills | Status: AC
  Filled 2024-03-11: qty 90, 90d supply, fill #0

## 2024-03-14 ENCOUNTER — Other Ambulatory Visit: Payer: Self-pay

## 2024-03-21 ENCOUNTER — Other Ambulatory Visit: Payer: Self-pay

## 2024-03-21 ENCOUNTER — Encounter: Payer: Self-pay | Admitting: Nurse Practitioner

## 2024-03-21 MED ORDER — PREDNISONE 20 MG PO TABS
20.0000 mg | ORAL_TABLET | Freq: Every day | ORAL | 0 refills | Status: AC
  Filled 2024-03-21: qty 5, 5d supply, fill #0

## 2024-03-21 MED ORDER — METHOCARBAMOL 500 MG PO TABS
500.0000 mg | ORAL_TABLET | Freq: Three times a day (TID) | ORAL | 0 refills | Status: AC
  Filled 2024-03-21: qty 15, 5d supply, fill #0

## 2024-03-24 ENCOUNTER — Other Ambulatory Visit: Payer: Self-pay

## 2024-03-25 ENCOUNTER — Other Ambulatory Visit: Payer: Self-pay

## 2024-03-29 ENCOUNTER — Emergency Department (HOSPITAL_BASED_OUTPATIENT_CLINIC_OR_DEPARTMENT_OTHER): Admission: EM | Admit: 2024-03-29 | Discharge: 2024-03-29 | Disposition: A | Discharge status: Home / Self Care

## 2024-03-29 ENCOUNTER — Other Ambulatory Visit: Payer: Self-pay

## 2024-03-29 DIAGNOSIS — E1140 Type 2 diabetes mellitus with diabetic neuropathy, unspecified: Secondary | ICD-10-CM | POA: Insufficient documentation

## 2024-03-29 DIAGNOSIS — Z7984 Long term (current) use of oral hypoglycemic drugs: Secondary | ICD-10-CM | POA: Diagnosis not present

## 2024-03-29 DIAGNOSIS — G629 Polyneuropathy, unspecified: Secondary | ICD-10-CM

## 2024-03-29 DIAGNOSIS — M79672 Pain in left foot: Secondary | ICD-10-CM | POA: Diagnosis present

## 2024-03-29 MED ORDER — GABAPENTIN 300 MG PO CAPS
300.0000 mg | ORAL_CAPSULE | Freq: Three times a day (TID) | ORAL | 0 refills | Status: AC
  Filled 2024-03-29 – 2024-03-30 (×2): qty 42, 14d supply, fill #0

## 2024-03-29 MED ORDER — GABAPENTIN 300 MG PO CAPS
300.0000 mg | ORAL_CAPSULE | Freq: Once | ORAL | Status: AC
  Administered 2024-03-29: 300 mg via ORAL
  Filled 2024-03-29: qty 1

## 2024-03-29 NOTE — Discharge Instructions (Addendum)
 Continue Tylenol  and Motrin as needed.  Take your gabapentin  3 times a day as prescribed.  Call and follow-up with your primary care doctor.

## 2024-03-29 NOTE — ED Triage Notes (Signed)
 Pt caox4 ambulatory c/o pain and numbness in L foot x1 wk, also reports bilat leg swelling over the past week as well, dx with sciatica 1/19 at [facility]. Was prescribed muscle relaxers and steroid with no relief.

## 2024-03-29 NOTE — ED Notes (Signed)
 Reviewed AVS/discharge instructions with patient. Time allotted for and all questions answered. Patient is agreeable for d/c and escorted to ED exit by staff.

## 2024-03-29 NOTE — ED Provider Notes (Signed)
 " [place] EMERGENCY DEPARTMENT AT [facility] Provider Note   CSN: [id number] Arrival date & time: 03/29/24  1457     Patient presents with: Foot Pain   Melinda Ingram is a 49 y.o. female.   49 year old female presents for evaluation of foot pain.  She was seen urgent care last week and diagnosed with sciatica.  States her back pain is better but she still has some left foot numbness and tingling and pain when she walks.  She finished her steroids.  Is also taking Tylenol  Motrin as needed.  Denies any other symptoms or concerns.  She does have a history of diabetes.   Foot Pain Pertinent negatives include no chest pain, no abdominal pain and no shortness of breath.       Prior to Admission medications  Medication Sig Start Date End Date Taking? Authorizing Provider  Accu-Chek Softclix Lancets lancets Use 2 (two) times daily. 08/26/23  Yes Paseda, Folashade R, FNP  gabapentin  (NEURONTIN ) 300 MG capsule Take 1 capsule (300 mg total) by mouth 3 (three) times daily. 03/29/24 04/12/24 Yes Leighanne Adolph L, DO  amLODipine  (NORVASC ) 2.5 MG tablet Take 1 tablet (2.5 mg total) by mouth daily. 02/08/24   Paseda, Folashade R, FNP  atorvastatin  (LIPITOR) 80 MG tablet Take 1 tablet (80 mg total) by mouth daily. 12/18/23   Paseda, Folashade R, FNP  Blood Glucose Monitoring Suppl (BLOOD GLUCOSE MONITOR SYSTEM) w/Device KIT 1 each by Does not apply route 2 (two) times daily. 08/26/23   Paseda, Folashade R, FNP  Dulaglutide  (TRULICITY ) 3 MG/0.5ML SOAJ Inject 3 mg as directed once a week. 02/05/24   Paseda, Folashade R, FNP  ferrous sulfate  325 (65 FE) MG EC tablet Take 1 tablet (325 mg total) by mouth daily. 12/18/23   Paseda, Folashade R, FNP  Glucose Blood (BLOOD GLUCOSE TEST STRIPS) STRP Use 2 (two) times daily. 08/26/23 04/17/25  Paseda, Folashade R, FNP  hydrochlorothiazide  (HYDRODIURIL ) 25 MG tablet Take 1 tablet (25 mg total) by mouth once daily. 03/11/24   Paseda, Folashade R, FNP   latanoprost  (XALATAN ) 0.005 % ophthalmic solution Instill 1 drop Both Eyes at bedtime 09/23/21     latanoprost  (XALATAN ) 0.005 % ophthalmic solution Place 1 drop into both eyes at bedtime. 09/07/23     latanoprost  (XALATAN ) 0.005 % ophthalmic solution Place 1 drop into both eyes in the morning. 02/02/24     metFORMIN  (GLUCOPHAGE ) 500 MG tablet Take 1 tablet (500 mg total) by mouth 2 (two) times daily with a meal. 08/05/23   Paseda, Folashade R, FNP  methocarbamol  (ROBAXIN ) 500 MG tablet Take 1 tablet (500 mg total) by mouth 3 (three) times daily x 5 days. 03/21/24     Olopatadine -Mometasone (RYALTRIS ) 665-25 MCG/ACT SUSP INSTILL 2 SPRAYS INTO EACH NOSTRIL IN THE MORNING AND AT BEDTIME. 09/30/23   Kozlow, Camellia PARAS, MD  predniSONE  (DELTASONE ) 20 MG tablet Take 1 tablet (20 mg total) by mouth daily x 5 days. 03/21/24       Allergies: Lisinopril    Review of Systems  Constitutional:  Negative for chills and fever.  HENT:  Negative for ear pain and sore throat.   Eyes:  Negative for pain and visual disturbance.  Respiratory:  Negative for cough and shortness of breath.   Cardiovascular:  Negative for chest pain and palpitations.  Gastrointestinal:  Negative for abdominal pain and vomiting.  Genitourinary:  Negative for dysuria and hematuria.  Musculoskeletal:  Negative for arthralgias and back pain.  Left foot pain  Skin:  Negative for color change and rash.  Neurological:  Negative for seizures and syncope.  All other systems reviewed and are negative.   Updated Vital Signs BP (!) 152/78 (BP Location: Left Arm)   Pulse 92   Temp 98 F (36.7 C)   Resp 16   Ht 5' 2 (1.575 m)   Wt 114.3 kg   LMP 02/28/2024 (Exact Date)   SpO2 99%   BMI 46.09 kg/m   Physical Exam Vitals and nursing note reviewed.  Constitutional:      General: She is not in acute distress.    Appearance: Normal appearance. She is well-developed. She is not ill-appearing.  HENT:     Head: Normocephalic and  atraumatic.  Eyes:     Conjunctiva/sclera: Conjunctivae normal.  Cardiovascular:     Rate and Rhythm: Normal rate and regular rhythm.     Heart sounds: No murmur heard. Pulmonary:     Effort: Pulmonary effort is normal. No respiratory distress.     Breath sounds: Normal breath sounds.  Abdominal:     Palpations: Abdomen is soft.     Tenderness: There is no abdominal tenderness.  Musculoskeletal:        General: No swelling.     Cervical back: Neck supple.  Skin:    General: Skin is warm and dry.     Capillary Refill: Capillary refill takes less than 2 seconds.  Neurological:     General: No focal deficit present.     Mental Status: She is alert. Mental status is at baseline.     Comments: Sensation intact bilaterally, 2+ dorsalis pedis pulses bilaterally, full range of motion of bilateral feet and ankles, no swelling or deformity noted  Psychiatric:        Mood and Affect: Mood normal.     (all labs ordered are listed, but only abnormal results are displayed) Labs Reviewed - No data to display  EKG: None  Radiology: No results found.   Procedures   Medications Ordered in the ED  gabapentin  (NEURONTIN ) capsule 300 mg (has no administration in time range)                                    Medical Decision Making Patient with some subjective left foot numbness and pain, but neuroexam is completely intact.  Was recently diagnosed with sciatica on that time and her back pain is improved.  Could be residual sciatica or some neuropathy from her diabetes.  Will start her on gabapentin .  Advised to continue Tylenol  Motrin obtain close follow-up with primary care and otherwise return to the ER for new or worsening symptoms.  She feels comfortable being discharged home.  Problems Addressed: Neuropathy: undiagnosed new problem with uncertain prognosis  Amount and/or Complexity of Data Reviewed External Data Reviewed: notes.    Details: Prior urgent care records reviewed and  patient seen last week for sciatica  Risk OTC drugs. Prescription drug management.    Final diagnoses:  Neuropathy    ED Discharge Orders          Ordered    gabapentin  (NEURONTIN ) 300 MG capsule  3 times daily        03/29/24 [id number] [place] L, DO 03/29/24 1713  "

## 2024-03-30 ENCOUNTER — Other Ambulatory Visit (HOSPITAL_BASED_OUTPATIENT_CLINIC_OR_DEPARTMENT_OTHER): Payer: Self-pay

## 2024-03-30 ENCOUNTER — Other Ambulatory Visit: Payer: Self-pay
